# Patient Record
Sex: Female | Born: 1985 | Race: White | Hispanic: No | Marital: Married | Smoking: Former smoker
Health system: Southern US, Community
[De-identification: ages and names within clinical notes are randomized; demographics above are authoritative.]

## PROBLEM LIST (undated history)

## (undated) DIAGNOSIS — C50919 Malignant neoplasm of unspecified site of unspecified female breast: Secondary | ICD-10-CM

## (undated) DIAGNOSIS — Z800 Family history of malignant neoplasm of digestive organs: Secondary | ICD-10-CM

## (undated) DIAGNOSIS — Z8042 Family history of malignant neoplasm of prostate: Secondary | ICD-10-CM

## (undated) DIAGNOSIS — Z87442 Personal history of urinary calculi: Secondary | ICD-10-CM

## (undated) DIAGNOSIS — F32A Depression, unspecified: Secondary | ICD-10-CM

## (undated) DIAGNOSIS — C801 Malignant (primary) neoplasm, unspecified: Secondary | ICD-10-CM

## (undated) DIAGNOSIS — K219 Gastro-esophageal reflux disease without esophagitis: Secondary | ICD-10-CM

## (undated) DIAGNOSIS — F419 Anxiety disorder, unspecified: Secondary | ICD-10-CM

## (undated) HISTORY — DX: Family history of malignant neoplasm of prostate: Z80.42

## (undated) HISTORY — DX: Family history of malignant neoplasm of digestive organs: Z80.0

## (undated) HISTORY — DX: Malignant neoplasm of unspecified site of unspecified female breast: C50.919

## (undated) HISTORY — PX: HERNIA REPAIR: SHX51

## (undated) HISTORY — PX: GASTRIC BYPASS: SHX52

---

## 2009-06-16 ENCOUNTER — Emergency Department: Payer: Self-pay | Admitting: Unknown Physician Specialty

## 2009-06-21 ENCOUNTER — Ambulatory Visit: Payer: Self-pay | Admitting: Internal Medicine

## 2010-06-29 ENCOUNTER — Ambulatory Visit: Payer: Self-pay | Admitting: Neurology

## 2019-02-26 ENCOUNTER — Other Ambulatory Visit: Payer: Self-pay | Admitting: Internal Medicine

## 2019-02-26 DIAGNOSIS — N6310 Unspecified lump in the right breast, unspecified quadrant: Secondary | ICD-10-CM

## 2019-03-03 ENCOUNTER — Other Ambulatory Visit: Payer: Self-pay

## 2019-05-10 HISTORY — PX: BREAST EXCISIONAL BIOPSY: SUR124

## 2019-05-10 HISTORY — PX: BREAST BIOPSY: SHX20

## 2019-05-10 HISTORY — PX: BREAST LUMPECTOMY: SHX2

## 2019-05-19 ENCOUNTER — Ambulatory Visit
Admission: RE | Admit: 2019-05-19 | Discharge: 2019-05-19 | Disposition: A | Discharge status: Home / Self Care | Payer: 59 | Source: Ambulatory Visit | Attending: Internal Medicine | Admitting: Internal Medicine

## 2019-05-19 ENCOUNTER — Other Ambulatory Visit: Payer: Self-pay | Admitting: Internal Medicine

## 2019-05-19 ENCOUNTER — Other Ambulatory Visit: Payer: Self-pay

## 2019-05-19 DIAGNOSIS — N6310 Unspecified lump in the right breast, unspecified quadrant: Secondary | ICD-10-CM

## 2019-05-19 DIAGNOSIS — N6320 Unspecified lump in the left breast, unspecified quadrant: Secondary | ICD-10-CM

## 2019-05-21 ENCOUNTER — Other Ambulatory Visit: Payer: Self-pay

## 2019-05-21 ENCOUNTER — Ambulatory Visit
Admission: RE | Admit: 2019-05-21 | Discharge: 2019-05-21 | Disposition: A | Discharge status: Home / Self Care | Payer: 59 | Source: Ambulatory Visit | Attending: Internal Medicine | Admitting: Internal Medicine

## 2019-05-21 DIAGNOSIS — N6310 Unspecified lump in the right breast, unspecified quadrant: Secondary | ICD-10-CM

## 2019-05-22 ENCOUNTER — Other Ambulatory Visit: Payer: Self-pay | Admitting: Internal Medicine

## 2019-05-22 ENCOUNTER — Telehealth: Payer: Self-pay | Admitting: Hematology and Oncology

## 2019-05-22 DIAGNOSIS — R935 Abnormal findings on diagnostic imaging of other abdominal regions, including retroperitoneum: Secondary | ICD-10-CM

## 2019-05-22 NOTE — Telephone Encounter (Signed)
Spoke with patient to confirm morning [facility] appointment for 11/18, packet emailed to patient

## 2019-05-25 ENCOUNTER — Encounter: Payer: Self-pay | Admitting: *Deleted

## 2019-05-25 ENCOUNTER — Other Ambulatory Visit: Payer: Self-pay | Admitting: *Deleted

## 2019-05-25 DIAGNOSIS — Z170 Estrogen receptor positive status [ER+]: Secondary | ICD-10-CM

## 2019-05-25 DIAGNOSIS — C50311 Malignant neoplasm of lower-inner quadrant of right female breast: Secondary | ICD-10-CM

## 2019-05-26 NOTE — Progress Notes (Signed)
[place] NOTE  Patient Care Team: Jilda Panda, MD as PCP - General (Internal Medicine) Nicholas Lose, MD as Consulting Physician (Hematology and Oncology) Rolm Bookbinder, MD as Consulting Physician (General Surgery) Gery Pray, MD as Consulting Physician (Radiation Oncology) Rockwell Germany, RN as Oncology Nurse Navigator Mauro Kaufmann, RN as Oncology Nurse Navigator  CHIEF COMPLAINTS/PURPOSE OF CONSULTATION:  Newly diagnosed breast cancer  HISTORY OF PRESENTING ILLNESS:  Kimberly Mueller 33 y.o. female is here because of recent diagnosis of invasive ductal carcinoma of the right breast. The patient palpated a right breast lump for two years. Diagnostic mammogram and [place] on 05/19/19 showed a 4.5cm right breast mass at the 6 o'clock position involving the overlying skin, 2 left breast mass at the 10 o'clock position measuring 2.4cm and 2.1cm and benign appearing, and no evidence of bilateral axillary adenopathy. Biopsy on 05/21/19 showed: in the left breast, fibroadenoma and no evidence of malignancy, and in the right breast, invasive ductal carcinoma with DCIS, grade 3, ER 70%, PR 30%, Ki-67 40%, HER-2 equivocal by IHC FISH pending. She presents to the clinic today for initial evaluation and discussion of treatment options.   I reviewed her records extensively and collaborated the history with the patient.  SUMMARY OF ONCOLOGIC HISTORY: Oncology History  Malignant neoplasm of lower-inner quadrant of right breast of female, estrogen receptor positive ([place])  05/25/2019 Initial Diagnosis   Patient palpated a right breast lump for two years. Diagnostic mammogram showed a 4.5cm right breast mass at the 6 o'clock position involving the overlying skin, 2 benign appearing left breast mass at the 10 o'clock position measuring 2.4cm and 2.1cm, and no axillary adenopathy. Biopsy showed: in the left breast, fibroadenoma and no evidence of malignancy, and in the right  breast, IDC with DCIS, grade 3.     MEDICAL HISTORY:  History reviewed. No pertinent past medical history.  SURGICAL HISTORY: Past Surgical History:  Procedure Laterality Date   GASTRIC BYPASS     HERNIA REPAIR      SOCIAL HISTORY: Social History   Socioeconomic History   Marital status: Married    Spouse name: Not on file   Number of children: Not on file   Years of education: Not on file   Highest education level: Not on file  Occupational History   Not on file  Social Needs   Financial resource strain: Not on file   Food insecurity    Worry: Not on file    Inability: Not on file   Transportation needs    Medical: Not on file    Non-medical: Not on file  Tobacco Use   Smoking status: Former Smoker   Smokeless tobacco: Never Used  Substance and Sexual Activity   Alcohol use: Never    Frequency: Never   Drug use: Never   Sexual activity: Not on file  Lifestyle   Physical activity    Days per week: Not on file    Minutes per session: Not on file   Stress: Not on file  Relationships   Social connections    Talks on phone: Not on file    Gets together: Not on file    Attends religious service: Not on file    Active member of club or organization: Not on file    Attends meetings of clubs or organizations: Not on file    Relationship status: Not on file   Intimate partner violence    Fear of current or ex partner:  Not on file    Emotionally abused: Not on file    Physically abused: Not on file    Forced sexual activity: Not on file  Other Topics Concern   Not on file  Social History Narrative   Not on file    FAMILY HISTORY: Family History  Problem Relation Age of Onset   Prostate cancer Father    Pancreatic cancer Paternal Grandmother     ALLERGIES:  has no allergies on file.  MEDICATIONS:  Current Outpatient Medications  Medication Sig Dispense Refill   Ascorbic Acid (VITAMIN C) 100 MG tablet Take 100 mg by mouth daily.      Cyanocobalamin (B-12 COMPLIANCE INJECTION) 1000 MCG/ML KIT Inject as directed.     etonogestrel (NEXPLANON) 68 MG IMPL implant 1 each by Subdermal route once.     Multiple Vitamin (MULTIVITAMIN) tablet Take 1 tablet by mouth daily.     vortioxetine HBr (TRINTELLIX) 5 MG TABS tablet Take 5 mg by mouth daily.     No current facility-administered medications for this visit.     REVIEW OF SYSTEMS:   Constitutional: Denies fevers, chills or abnormal night sweats Eyes: Denies blurriness of vision, double vision or watery eyes Ears, nose, mouth, throat, and face: Denies mucositis or sore throat Respiratory: Denies cough, dyspnea or wheezes Cardiovascular: Denies palpitation, chest discomfort or lower extremity swelling Gastrointestinal:  Denies nausea, heartburn or change in bowel habits Skin: Denies abnormal skin rashes Lymphatics: Denies new lymphadenopathy or easy bruising Neurological:Denies numbness, tingling or new weaknesses Behavioral/Psych: Mood is stable, no new changes  Breast: Palpable bilateral breast masses All other systems were reviewed with the patient and are negative.  PHYSICAL EXAMINATION: ECOG PERFORMANCE STATUS: 1 - Symptomatic but completely ambulatory  Vitals:   05/27/19 0837  BP: 117/85  Pulse: 97  Resp: 20  Temp: 97.8 F (36.6 C)  SpO2: 98%   Filed Weights   05/27/19 0837  Weight: 190 lb 9.6 oz (86.5 kg)    GENERAL:alert, no distress and comfortable SKIN: skin color, texture, turgor are normal, no rashes or significant lesions EYES: normal, conjunctiva are pink and non-injected, sclera clear OROPHARYNX:no exudate, no erythema and lips, buccal mucosa, and tongue normal  NECK: supple, thyroid normal size, non-tender, without nodularity LYMPH:  no palpable lymphadenopathy in the cervical, axillary or inguinal LUNGS: clear to auscultation and percussion with normal breathing effort HEART: regular rate & rhythm and no murmurs and no lower extremity  edema ABDOMEN:abdomen soft, non-tender and normal bowel sounds Musculoskeletal:no cyanosis of digits and no clubbing  PSYCH: alert & oriented x 3 with fluent speech NEURO: no focal motor/sensory deficits BREAST: No palpable nodules in breast. No palpable axillary or supraclavicular lymphadenopathy (exam performed in the presence of a chaperone)   LABORATORY DATA:  I have reviewed the data as listed Lab Results  Component Value Date   WBC 5.3 05/27/2019   HGB 14.5 05/27/2019   HCT 43.7 05/27/2019   MCV 86.5 05/27/2019   PLT 303 05/27/2019   Lab Results  Component Value Date   NA 140 05/27/2019   K 3.7 05/27/2019   CL 105 05/27/2019   CO2 19 (L) 05/27/2019    RADIOGRAPHIC STUDIES: I have personally reviewed the radiological reports and agreed with the findings in the report.  ASSESSMENT AND PLAN:  Malignant neoplasm of lower-inner quadrant of right breast of female, estrogen receptor positive ([place]) 05/15/2019:Patient palpated a right breast lump for two years. Diagnostic mammogram showed a 4.5cm right breast mass at  the 6 o'clock position involving the overlying skin, 2 benign appearing left breast mass at the 10 o'clock position measuring 2.4cm and 2.1cm, and no axillary adenopathy. Biopsy showed: in the left breast, fibroadenoma and no evidence of malignancy, and in the right breast, IDC with DCIS, grade 3.  T2 N0 stage IIa clinical stage  Pathology and radiology counseling: Discussed with the patient, the details of pathology including the type of breast cancer,the clinical staging, the significance of ER, PR and HER-2/neu receptors and the implications for treatment. After reviewing the pathology in detail, we proceeded to discuss the different treatment options between surgery, radiation, chemotherapy, antiestrogen therapies.  Treatment plan: 1.  Neoadjuvant chemotherapy (based upon HER-2 result treatment will change) starting 06/10/2019 2.  Breast conserving surgery with  sentinel lymph node biopsy 3.  Adjuvant radiation 4.  Follow-up adjuvant antiestrogen therapy  Chemotherapy Counseling: I discussed the risks and benefits of chemotherapy including the risks of nausea/ vomiting, risk of infection from low WBC count, fatigue due to chemo or anemia, bruising or bleeding due to low platelets, mouth sores, loss/ change in taste and decreased appetite. Liver and kidney function will be monitored through out chemotherapy as abnormalities in liver and kidney function may be a side effect of treatment. Cardiac dysfunction due to Adriamycin vs Herceptin and Perjeta were discussed in detail. Risk of permanent bone marrow dysfunction and leukemia due to chemo were also discussed.  Fertility preservation: Patient informed me that she does not plan to have children and does not want to do fertility preservation.  Recent gastric bypass surgery revealed a lesion on the liver: We will obtain CT chest abdomen pelvis and bone scan and consider biopsying of this lesion if necessary.  I will see her next week to finalize the chemo plan. Plan to start chemo on 06/10/2019     All questions were answered. The patient knows to call the clinic with any problems, questions or concerns.   Rulon Eisenmenger, MD, MPH 05/27/2019    I, Molly Dorshimer, am acting as scribe for Nicholas Lose, MD.  I have reviewed the above documentation for accuracy and completeness, and I agree with the above.

## 2019-05-27 ENCOUNTER — Encounter: Payer: Self-pay | Admitting: Hematology and Oncology

## 2019-05-27 ENCOUNTER — Ambulatory Visit (HOSPITAL_BASED_OUTPATIENT_CLINIC_OR_DEPARTMENT_OTHER): Payer: 59 | Admitting: Licensed Clinical Social Worker

## 2019-05-27 ENCOUNTER — Inpatient Hospital Stay: Payer: 59

## 2019-05-27 ENCOUNTER — Other Ambulatory Visit: Payer: Self-pay | Admitting: *Deleted

## 2019-05-27 ENCOUNTER — Ambulatory Visit: Payer: 59 | Admitting: Radiation Oncology

## 2019-05-27 ENCOUNTER — Encounter: Payer: Self-pay | Admitting: Physical Therapy

## 2019-05-27 ENCOUNTER — Inpatient Hospital Stay (HOSPITAL_BASED_OUTPATIENT_CLINIC_OR_DEPARTMENT_OTHER): Payer: 59 | Admitting: Hematology and Oncology

## 2019-05-27 ENCOUNTER — Other Ambulatory Visit: Payer: Self-pay | Admitting: General Surgery

## 2019-05-27 ENCOUNTER — Other Ambulatory Visit: Payer: Self-pay

## 2019-05-27 ENCOUNTER — Encounter: Payer: Self-pay | Admitting: Licensed Clinical Social Worker

## 2019-05-27 ENCOUNTER — Ambulatory Visit: Payer: 59 | Attending: General Surgery | Admitting: Physical Therapy

## 2019-05-27 ENCOUNTER — Ambulatory Visit
Admission: RE | Admit: 2019-05-27 | Discharge: 2019-05-27 | Disposition: A | Discharge status: Home / Self Care | Payer: 59 | Source: Ambulatory Visit | Attending: Radiation Oncology | Admitting: Radiation Oncology

## 2019-05-27 DIAGNOSIS — Z170 Estrogen receptor positive status [ER+]: Secondary | ICD-10-CM | POA: Insufficient documentation

## 2019-05-27 DIAGNOSIS — Z8042 Family history of malignant neoplasm of prostate: Secondary | ICD-10-CM | POA: Insufficient documentation

## 2019-05-27 DIAGNOSIS — Z79899 Other long term (current) drug therapy: Secondary | ICD-10-CM | POA: Insufficient documentation

## 2019-05-27 DIAGNOSIS — R293 Abnormal posture: Secondary | ICD-10-CM | POA: Insufficient documentation

## 2019-05-27 DIAGNOSIS — C50311 Malignant neoplasm of lower-inner quadrant of right female breast: Secondary | ICD-10-CM

## 2019-05-27 DIAGNOSIS — Z87891 Personal history of nicotine dependence: Secondary | ICD-10-CM | POA: Insufficient documentation

## 2019-05-27 DIAGNOSIS — Z800 Family history of malignant neoplasm of digestive organs: Secondary | ICD-10-CM | POA: Diagnosis not present

## 2019-05-27 DIAGNOSIS — K769 Liver disease, unspecified: Secondary | ICD-10-CM | POA: Insufficient documentation

## 2019-05-27 LAB — CMP (CANCER CENTER ONLY)
ALT: 27 U/L (ref 0–44)
AST: 22 U/L (ref 15–41)
Albumin: 4.3 g/dL (ref 3.5–5.0)
Alkaline Phosphatase: 69 U/L (ref 38–126)
Anion gap: 16 — ABNORMAL HIGH (ref 5–15)
BUN: 9 mg/dL (ref 6–20)
CO2: 19 mmol/L — ABNORMAL LOW (ref 22–32)
Calcium: 9.4 mg/dL (ref 8.9–10.3)
Chloride: 105 mmol/L (ref 98–111)
Creatinine: 0.77 mg/dL (ref 0.44–1.00)
GFR, Est AFR Am: >60 mL/min (ref >60)
GFR, Estimated: >60 mL/min (ref >60)
Glucose, Bld: 102 mg/dL — ABNORMAL HIGH (ref 70–99)
Potassium: 3.7 mmol/L (ref 3.5–5.1)
Sodium: 140 mmol/L (ref 135–145)
Total Bilirubin: 0.8 mg/dL (ref 0.3–1.2)
Total Protein: 7.4 g/dL (ref 6.5–8.1)

## 2019-05-27 LAB — CBC WITH DIFFERENTIAL (CANCER CENTER ONLY)
Abs Immature Granulocytes: 0 10*3/uL (ref 0.00–0.07)
Basophils Absolute: 0 10*3/uL (ref 0.0–0.1)
Basophils Relative: 1 %
Eosinophils Absolute: 0.1 10*3/uL (ref 0.0–0.5)
Eosinophils Relative: 3 %
HCT: 43.7 % (ref 36.0–46.0)
Hemoglobin: 14.5 g/dL (ref 12.0–15.0)
Immature Granulocytes: 0 %
Lymphocytes Relative: 27 %
Lymphs Abs: 1.4 10*3/uL (ref 0.7–4.0)
MCH: 28.7 pg (ref 26.0–34.0)
MCHC: 33.2 g/dL (ref 30.0–36.0)
MCV: 86.5 fL (ref 80.0–100.0)
Monocytes Absolute: 0.7 10*3/uL (ref 0.1–1.0)
Monocytes Relative: 14 %
Neutro Abs: 3 10*3/uL (ref 1.7–7.7)
Neutrophils Relative %: 55 %
Platelet Count: 303 10*3/uL (ref 150–400)
RBC: 5.05 MIL/uL (ref 3.87–5.11)
RDW: 14.6 % (ref 11.5–15.5)
WBC Count: 5.3 10*3/uL (ref 4.0–10.5)
nRBC: 0 % (ref 0.0–0.2)

## 2019-05-27 NOTE — Assessment & Plan Note (Signed)
05/15/2019:Patient palpated a right breast lump for two years. Diagnostic mammogram showed a 4.5cm right breast mass at the 6 o'clock position involving the overlying skin, 2 benign appearing left breast mass at the 10 o'clock position measuring 2.4cm and 2.1cm, and no axillary adenopathy. Biopsy showed: in the left breast, fibroadenoma and no evidence of malignancy, and in the right breast, IDC with DCIS, grade 3.  T2 N0 stage IIa clinical stage  Pathology and radiology counseling: Discussed with the patient, the details of pathology including the type of breast cancer,the clinical staging, the significance of ER, PR and HER-2/neu receptors and the implications for treatment. After reviewing the pathology in detail, we proceeded to discuss the different treatment options between surgery, radiation, chemotherapy, antiestrogen therapies.  Treatment plan: 1.  Neoadjuvant chemotherapy (based upon HER-2 result treatment will change) starting 06/10/2019 2.  Breast conserving surgery with sentinel lymph node biopsy 3.  Adjuvant radiation 4.  Follow-up adjuvant antiestrogen therapy  Chemotherapy Counseling: I discussed the risks and benefits of chemotherapy including the risks of nausea/ vomiting, risk of infection from low WBC count, fatigue due to chemo or anemia, bruising or bleeding due to low platelets, mouth sores, loss/ change in taste and decreased appetite. Liver and kidney function will be monitored through out chemotherapy as abnormalities in liver and kidney function may be a side effect of treatment. Cardiac dysfunction due to Adriamycin vs Herceptin and Perjeta were discussed in detail. Risk of permanent bone marrow dysfunction and leukemia due to chemo were also discussed.  Fertility preservation: Patient informed me that she does not plan to have children and does not want to do fertility preservation.  Recent gastric bypass surgery revealed a lesion on the liver: We will obtain CT chest  abdomen pelvis and bone scan and consider biopsying of this lesion if necessary.  I will see her next week to finalize the chemo plan. Plan to start chemo on 06/10/2019

## 2019-05-27 NOTE — Progress Notes (Signed)
REFERRING PROVIDER: Nicholas Lose, MD [address] [place],  [place] [address]  PRIMARY PROVIDER:  Jilda Panda, MD  PRIMARY REASON FOR VISIT:  1. Malignant neoplasm of lower-inner quadrant of right breast of female, estrogen receptor positive ([place])   2. Family history of prostate cancer   3. Family history of pancreatic cancer     I connected with Kimberly Mueller on 05/27/2019 at 11:20 AM EDT by Jackquline Denmark and verified that I am speaking with the correct person using two identifiers.    Patient location: [facility] Provider location: office  HISTORY OF PRESENT ILLNESS:   Kimberly Mueller, a 33 y.o. female, was seen for a [place] cancer genetics consultation at the request of Dr. Lindi Adie due to a personal and family history of cancer.  Kimberly Mueller presents to clinic today to discuss the possibility of a hereditary predisposition to cancer, genetic testing, and to further clarify her future cancer risks, as well as potential cancer risks for family members.   In 2020, at the age of 66, Kimberly Mueller was diagnosed with invasive ductal carcinoma of the right breast. The treatment plan includes neoadjuvant chemotherapy, surgery and radiation.    CANCER HISTORY:  Oncology History  Malignant neoplasm of lower-inner quadrant of right breast of female, estrogen receptor positive ([place])  05/25/2019 Initial Diagnosis   Patient palpated a right breast lump for two years. Diagnostic mammogram showed a 4.5cm right breast mass at the 6 o'clock position involving the overlying skin, 2 benign appearing left breast mass at the 10 o'clock position measuring 2.4cm and 2.1cm, and no axillary adenopathy. Biopsy showed: in the left breast, fibroadenoma and no evidence of malignancy, and in the right breast, IDC with DCIS, grade 3.  ER 70%, PR 30%, Ki-67 40%, HER-2 IHC equivocal FISH pending      RISK FACTORS:  Menarche was at age 53.  First live birth at age no children.  OCP use for approximately 6 years.   Ovaries intact: yes.  Hysterectomy: no.  Menopausal status: premenopausal.  HRT use: 0 years. Colonoscopy: no; not examined. Mammogram within the last year: yes. Number of breast biopsies: 1. Up to date with pelvic exams: yes. Any excessive radiation exposure in the past: no  Past Medical History:  Diagnosis Date  . Family history of pancreatic cancer   . Family history of prostate cancer     Past Surgical History:  Procedure Laterality Date  . GASTRIC BYPASS    . HERNIA REPAIR      Social History   Socioeconomic History  . Marital status: Married    Spouse name: Not on file  . Number of children: Not on file  . Years of education: Not on file  . Highest education level: Not on file  Occupational History  . Not on file  Social Needs  . Financial resource strain: Not on file  . Food insecurity    Worry: Not on file    Inability: Not on file  . Transportation needs    Medical: Not on file    Non-medical: Not on file  Tobacco Use  . Smoking status: Former Research scientist (life sciences)  . Smokeless tobacco: Never Used  Substance and Sexual Activity  . Alcohol use: Never    Frequency: Never  . Drug use: Never  . Sexual activity: Not on file  Lifestyle  . Physical activity    Days per week: Not on file    Minutes per session: Not on file  . Stress: Not on file  Relationships  . Social Herbalist on phone: Not on file    Gets together: Not on file    Attends religious service: Not on file    Active member of club or organization: Not on file    Attends meetings of clubs or organizations: Not on file    Relationship status: Not on file  Other Topics Concern  . Not on file  Social History Narrative  . Not on file     FAMILY HISTORY:  We obtained a detailed, 4-generation family history.  Significant diagnoses are listed below: Family History  Problem Relation Age of Onset  . Prostate cancer Father   . Pancreatic cancer Paternal Grandmother    Kimberly Mueller does not  have children. She has 1 brother, 18, and 1 sister, 74, and 5 nieces/nephews.  Kimberly Mueller mother is living at 39 with no history of cancer. Patient has 1 maternal uncle, no cancers. No known cancers in maternal cousins. Maternal grandmother had skin cancer on her chest, she is living in her 37s. Maternal grandfather passed in his 43s. His father, patient's great grandfather, had pancreatic cancer.   Kimberly Mueller father is living at 34 and had prostate cancer at 76. Patient has 1 paternal aunt, 1 paternal uncle, no cancers. No known cancers in paternal cousins. Paternal grandmother is living in her 85s, paternal grandfather passed due to heart issues, no history of cancer.  Kimberly Mueller is unaware of previous family history of genetic testing for hereditary cancer risks. Patient's maternal ancestors are of European descent, and paternal ancestors are of European descent. There is no reported Ashkenazi Jewish ancestry. There is no known consanguinity.  GENETIC COUNSELING ASSESSMENT: Ms. Pals is a 33 y.o. female with a personal history which is somewhat suggestive of a hereditary cancer syndrome and predisposition to cancer. We, therefore, discussed and recommended the following at today's visit.   DISCUSSION: We discussed that 5 - 10% of breast cancer is hereditary, with most cases associated with BRCA1/BRCA2 mutations.  There are other genes that can be associated with hereditary breast cancer syndromes, and other genes associated with some of the cancers seen in her family such as prostate and pancreatic. We discussed that testing is beneficial for several reasons including surgical decision-making for breast cancer, knowing how to follow individuals after completing their treatment, and understand if other family members could be at risk for cancer and allow them to undergo genetic testing.   We reviewed the characteristics, features and inheritance patterns of hereditary cancer syndromes. We also  discussed genetic testing, including the appropriate family members to test, the process of testing, insurance coverage and turn-around-time for results. We discussed the implications of a negative, positive and/or variant of uncertain significant result. We recommended Kimberly Mueller pursue genetic testing for the [facility] Common Hereditary Cancers gene panel.   The Common Hereditary Cancers Panel offered by [facility] includes sequencing and/or deletion duplication testing of the following 48 genes: APC, ATM, AXIN2, BARD1, BMPR1A, BRCA1, BRCA2, BRIP1, CDH1, CDKN2A (p14ARF), CDKN2A (p16INK4a), CKD4, CHEK2, CTNNA1, DICER1, EPCAM (Deletion/duplication testing only), GREM1 (promoter region deletion/duplication testing only), KIT, MEN1, MLH1, MSH2, MSH3, MSH6, MUTYH, NBN, NF1, NHTL1, PALB2, PDGFRA, PMS2, POLD1, POLE, PTEN, RAD50, RAD51C, RAD51D, RNF43, SDHB, SDHC, SDHD, SMAD4, SMARCA4. STK11, TP53, TSC1, TSC2, and VHL.  The following genes were evaluated for sequence changes only: SDHA and HOXB13 c.251G>A variant only.  Based on Kimberly Mueller personal and family history of cancer, she meets medical criteria for genetic  testing. Despite that she meets criteria, she may still have an out of pocket cost.   PLAN: After considering the risks, benefits, and limitations, Kimberly Mueller provided informed consent to pursue genetic testing and the blood sample was sent to [facility] for analysis of the Common Hereditary Cancers Panel. Results should be available within approximately 2-3 weeks' time, at which point they will be disclosed by telephone to Kimberly Mueller, as will any additional recommendations warranted by these results. Kimberly Mueller will receive a summary of her genetic counseling visit and a copy of her results once available. This information will also be available in Epic.   Kimberly Mueller questions were answered to her satisfaction today. Our contact information was provided should additional questions or concerns  arise. Thank you for the referral and allowing [place] to share in the care of your patient.   Faith Rogue, MS, [facility] Genetic Counselor [email or web address] Phone: [phone]  The patient was seen for a total of 20 minutes in virtual genetic counseling.  Drs. Magrinat, Lindi Adie and/or Burr Medico were available for discussion regarding this case.   _______________________________________________________________________ For Office Staff:  Number of people involved in session: 1 Was an Intern/ student involved with case: no

## 2019-05-27 NOTE — Patient Instructions (Signed)

## 2019-05-27 NOTE — Therapy (Signed)
[place], [place], [address] Phone: [phone]   Fax:  [phone]  Physical Therapy Evaluation  Patient Details  Name: [place] MRN: [id number] Date of Birth: January 11, 1986 Referring Provider (PT): Dr. Rolm Bookbinder   Encounter Date: 05/27/2019  PT End of Session - 05/27/19 1133    Visit Number  1    Number of Visits  1    PT Start Time  1030    PT Stop Time  1055    PT Time Calculation (min)  25 min    Activity Tolerance  Patient tolerated treatment well    Behavior During Therapy  [facility] for tasks assessed/performed       History reviewed. No pertinent past medical history.  Past Surgical History:  Procedure Laterality Date  . GASTRIC BYPASS    . HERNIA REPAIR      There were no vitals filed for this visit.   Subjective Assessment - 05/27/19 1119    Subjective  Patient reports she is here today to be seen by her medical team for her newly diagnosed right breast cancer.    Patient is accompained by:  Family member    Pertinent History  Patient was diagnosed on 05/19/2019 with right invasive ductal carcinoma breast cancer. It measures 4.5 cm and is located in the lower inner quadrant. It is ER/PR positive and HER2 equivocal with a Ki67 of 40%. She had gastric bypass and a hernia repair in [place] on 04/14/2019.    Patient Stated Goals  Reduce lymphedema risk and learn post op shoulder ROM HEP    Currently in Pain?  No/denies         [facility] PT Assessment - 05/27/19 0001      Assessment   Medical Diagnosis  Right breast cancer    Referring Provider (PT)  Dr. Rolm Bookbinder    Onset Date/Surgical Date  05/19/19    Hand Dominance  Right    Prior Therapy  none      Precautions   Precautions  Other (comment)    Precaution Comments  active cancer      Restrictions   Weight Bearing Restrictions  No      Balance Screen   Has the patient fallen in the past 6 months  No    Has  the patient had a decrease in activity level because of a fear of falling?   No    Is the patient reluctant to leave their home because of a fear of falling?   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    Leisure  She walks 2-4x/week for 30-60 min      Cognition   Overall Cognitive Status  Within Functional Limits for tasks assessed      Posture/Postural Control   Posture/Postural Control  Postural limitations    Postural Limitations  Rounded Shoulders;Forward head      ROM / Strength   AROM / PROM / Strength  AROM;Strength      AROM   Overall AROM Comments  Cervical AROM is WNL    AROM Assessment Site  Shoulder    Right/Left Shoulder  Right;Left    Right Shoulder Extension  48 Degrees    Right Shoulder Flexion  150 Degrees    Right Shoulder ABduction  174 Degrees    Right Shoulder Internal Rotation  76 Degrees    Right Shoulder External Rotation  90 Degrees    Left Shoulder Extension  47 Degrees    Left Shoulder Flexion  157 Degrees    Left Shoulder ABduction  178 Degrees    Left Shoulder Internal Rotation  62 Degrees    Left Shoulder External Rotation  90 Degrees      Strength   Overall Strength  Within functional limits for tasks performed        LYMPHEDEMA/ONCOLOGY QUESTIONNAIRE - 05/27/19 1127      Type   Cancer Type  Right breast cancer      Lymphedema Assessments   Lymphedema Assessments  Upper extremities      Right Upper Extremity Lymphedema   10 cm Proximal to Olecranon Process  35.6 cm    Olecranon Process  26.5 cm    10 cm Proximal to Ulnar Styloid Process  24.4 cm    Just Proximal to Ulnar Styloid Process  16.5 cm    Across Hand at [employer]  17.4 cm    At [place]  of 2nd Digit  5.9 cm      Left Upper Extremity Lymphedema   10 cm Proximal to Olecranon Process  35.7 cm    Olecranon Process  26.8 cm    10 cm Proximal to Ulnar Styloid Process  23.6 cm    Just Proximal to Ulnar Styloid Process  16.8 cm    Across Hand at [employer]  18.5 cm    At [place] of 2nd Digit  5.6 cm          Quick Dash - 05/27/19 0001    Open a tight or new jar  No difficulty    Do heavy household chores (wash walls, wash floors)  No difficulty    Carry a shopping bag or briefcase  No difficulty    Wash your back  No difficulty    Use a knife to cut food  No difficulty    Recreational activities in which you take some force or impact through your arm, shoulder, or hand (golf, hammering, tennis)  No difficulty    During the past week, to what extent has your arm, shoulder or hand problem interfered with your normal social activities with family, friends, neighbors, or groups?  Not at all    During the past week, to what extent has your arm, shoulder or hand problem limited your work or other regular daily activities  Not at all    Arm, shoulder, or hand pain.  None    Tingling (pins and needles) in your arm, shoulder, or hand  None    Difficulty Sleeping  No difficulty    DASH Score  0 %        Objective measurements completed on examination: See above findings.       Patient was instructed today in a home exercise program today for post op shoulder range of motion. These included active assist shoulder flexion in sitting, scapular retraction, wall walking with shoulder abduction, and hands behind head external rotation.  She was encouraged to do these twice a day, holding 3 seconds and repeating 5 times when permitted by her physician.  PT Education - 05/27/19 1132    Education Details  Lymphedema risk reduction and post op shoulder ROM HEP    Person(s) Educated  Patient;Spouse    Methods  Explanation;Demonstration;Handout    Comprehension  Returned  demonstration;Verbalized understanding           Breast Clinic Goals - 05/27/19 1138      Patient will be able to verbalize understanding of pertinent lymphedema risk reduction practices relevant to her diagnosis specifically related to skin care.   Time  1    Period  Days    Status  Achieved      Patient will be able to return demonstrate and/or verbalize understanding of the post-op home exercise program related to regaining shoulder range of motion.   Time  1    Period  Days    Status  Achieved      Patient will be able to verbalize understanding of the importance of attending the postoperative After Breast Cancer Class for further lymphedema risk reduction education and therapeutic exercise.   Time  1    Period  Days    Status  Achieved            Plan - 05/27/19 1133    Clinical Impression Statement  Patient was diagnosed on 05/19/2019 with right invasive ductal carcinoma breast cancer. It measures 4.5 cm and is located in the lower inner quadrant. It is ER/PR positive and HER2 equivocal with a Ki67 of 40%. She had gastric bypass and a hernia repair in [place] on 04/14/2019. Her multidisciplinary medical team met prior to her assessments to determine a recommended treatment plan. She is planning to have neoadjuvant chemotherapy followed by a right lumpectomy or mastectomy - either with sentinel node biopsy followed by radiation if she has a lumpectomy and then followed by anti-estrogen therapy. She will benefit from a post op PT reassessment if MD refers.    Personal Factors and Comorbidities  Comorbidity 1    Comorbidities  Recent surgery 1 month ago to repair hernia and gastric bypass    Stability/Clinical Decision Making  Stable/Uncomplicated    Clinical Decision Making  Low    Rehab Potential  Excellent    PT Frequency  One time visit    PT Treatment/Interventions  ADLs/Self Care Home Management;Therapeutic exercise;Patient/family education    PT Next Visit Plan  Will  reassess if MD refers after surgery    PT Home Exercise Plan  Post op shoulder ROM HEP    Consulted and Agree with Plan of Care  Patient;Family member/caregiver    Family Member Consulted  Husband       Patient will benefit from skilled therapeutic intervention in order to improve the following deficits and impairments:  Postural dysfunction, Decreased range of motion, Pain, Impaired UE functional use, Decreased knowledge of precautions  Visit Diagnosis: Malignant neoplasm of lower-inner quadrant of right breast of female, estrogen receptor positive ([place]) - Plan: PT plan of care cert/re-cert  Abnormal posture - Plan: PT plan of care cert/re-cert   Patient will follow up at outpatient cancer rehab 3-4 weeks following surgery.  If the patient requires physical therapy at that time, a specific plan will be dictated and sent to the referring physician for approval. The patient was educated today on appropriate basic range of motion exercises to begin post operatively and the importance of attending the After Breast Cancer class following surgery.  Patient was educated today on lymphedema risk reduction practices as it pertains to recommendations  that will benefit the patient immediately following surgery.  She verbalized good understanding.      Problem List Patient Active Problem List   Diagnosis Date Noted  . Malignant neoplasm of lower-inner quadrant of right breast of female, estrogen receptor positive ([place]) 05/25/2019   Annia Friendly, PT 05/27/19 11:40 AM  [place], [place], [address] Phone: [phone]   Fax:  [phone]  Name: Channel Papandrea MRN: [id number] Date of Birth: February 19, 1986

## 2019-05-27 NOTE — Progress Notes (Signed)
Radiation Oncology         [phone] ________________________________  Multidisciplinary Breast Oncology Clinic [facility]) Initial Outpatient Consultation  Name: Kimberly Mueller MRN: [id number]  Date: 05/27/2019  DOB: [birth year]  JG:[id number], Carloyn Manner, MD  Rolm Bookbinder, MD   REFERRING PHYSICIAN: Rolm Bookbinder, MD  DIAGNOSIS: The encounter diagnosis was Malignant neoplasm of lower-inner quadrant of right breast of female, estrogen receptor positive ([place]).  Stage IIA, Right Breast LIQ, Invasive Ductal with DCIS and LVI, ER+/ PR+ / Her2+, Grade 3    ICD-10-CM   1. Malignant neoplasm of lower-inner quadrant of right breast of female, estrogen receptor positive ([place])  C50.311    Z17.0     HISTORY OF PRESENT ILLNESS::Kimberly Mueller is a 33 y.o. female who is presenting to the office today for evaluation of her newly diagnosed breast cancer. She is accompanied by her husband. She is doing well overall.   She had a palpable lump on the right breast for two years. Lump was also palpated in the left breast on clinical exam. She underwent bilateral diagnostic mammography with tomography and bilateral breast ultrasonography at [facility] on 05/19/2019 showing a suspicious 4.5 cm mass at 6 o'clock at the palpable site in the lower-inner right breast which appears to be invading the overlying skin. It also showed two solid masses in the left breast at 10 o'clock. No evidence of bilateral axillary lymphadenopathy.  Biopsy on 05/21/2019 showed: invasive ductal carcinoma and lymphovascular invasion in right breast; fibroadenoma without malignancy in left breast. Prognostic indicators significant for: estrogen receptor, 70% positive and progesterone receptor, 30% positive, both with strong staining intensity. Proliferation marker Ki67 at 40%. HER2 positive.  Menarche: 33 years old Age at first live birth: N/A GP: 0 LMP: 05/19/2019 Contraceptive: Yes, 10+ years ago HRT: No   The patient  was referred today for presentation in the multidisciplinary conference.  Radiology studies and pathology slides were presented there for review and discussion of treatment options.  A consensus was discussed regarding potential next steps.  PREVIOUS RADIATION THERAPY: No  PAST MEDICAL HISTORY:  Past Medical History:  Diagnosis Date   Family history of pancreatic cancer    Family history of prostate cancer     PAST SURGICAL HISTORY: Past Surgical History:  Procedure Laterality Date   GASTRIC BYPASS     HERNIA REPAIR      FAMILY HISTORY:  Family History  Problem Relation Age of Onset   Prostate cancer Father    Pancreatic cancer Paternal Grandmother     SOCIAL HISTORY:  Social History   Socioeconomic History   Marital status: Married    Spouse name: Not on file   Number of children: Not on file   Years of education: Not on file   Highest education level: Not on file  Occupational History   Not on file  Social Needs   Financial resource strain: Not on file   Food insecurity    Worry: Not on file    Inability: Not on file   Transportation needs    Medical: Not on file    Non-medical: Not on file  Tobacco Use   Smoking status: Former Smoker   Smokeless tobacco: Never Used  Substance and Sexual Activity   Alcohol use: Never    Frequency: Never   Drug use: Never   Sexual activity: Not on file  Lifestyle   Physical activity    Days per week: Not on file    Minutes per session: Not on  file   Stress: Not on file  Relationships   Social connections    Talks on phone: Not on file    Gets together: Not on file    Attends religious service: Not on file    Active member of club or organization: Not on file    Attends meetings of clubs or organizations: Not on file    Relationship status: Not on file  Other Topics Concern   Not on file  Social History Narrative   Not on file    ALLERGIES: Not on File  MEDICATIONS:  Current Outpatient  Medications  Medication Sig Dispense Refill   Ascorbic Acid (VITAMIN C) 100 MG tablet Take 100 mg by mouth daily.     Cyanocobalamin (B-12 COMPLIANCE INJECTION) 1000 MCG/ML KIT Inject as directed.     etonogestrel (NEXPLANON) 68 MG IMPL implant 1 each by Subdermal route once.     Multiple Vitamin (MULTIVITAMIN) tablet Take 1 tablet by mouth daily.     vortioxetine HBr (TRINTELLIX) 5 MG TABS tablet Take 5 mg by mouth daily.     No current facility-administered medications for this encounter.     REVIEW OF SYSTEMS: A 10+ POINT REVIEW OF SYSTEMS WAS OBTAINED including neurology, dermatology, psychiatry, cardiac, respiratory, lymph, extremities, GI, GU, musculoskeletal, constitutional, reproductive, HEENT. On the provided form, she reports dental problems, pain/lump in right breast, and right breast dimpling. Wears glasses. She denies chest pain, cough, shortness of breath, skin changes, and any other symptoms.    PHYSICAL EXAM:  Vitals with BMI 05/27/2019  Height '5\' 3"'$   Weight 190 lbs 10 oz  BMI 94.70  Systolic 962  Diastolic 85  Pulse 97   Lungs are clear to auscultation bilaterally. Heart has regular rate and rhythm. No palpable cervical, supraclavicular, or axillary adenopathy. Abdomen soft, non-tender, normal bowel sounds. Breast: Left breast with bruising in upper inner aspect with palpable nodules (benign on biopsy). Right breast has palpable mass at the 6 o'clock position that is 3.5 x 4.0 cm. Mass is mobile. No skin involvement or chest wall fixation.  KPS = 90  100 - Normal; no complaints; no evidence of disease. 90   - Able to carry on normal activity; minor signs or symptoms of disease. 80   - Normal activity with effort; some signs or symptoms of disease. 29   - Cares for self; unable to carry on normal activity or to do active work. 60   - Requires occasional assistance, but is able to care for most of his personal needs. 50   - Requires considerable assistance and  frequent medical care. 15   - Disabled; requires special care and assistance. 20   - Severely disabled; hospital admission is indicated although death not imminent. 54   - Very sick; hospital admission necessary; active supportive treatment necessary. 10   - Moribund; fatal processes progressing rapidly. 0     - Dead  Karnofsky DA, Abelmann Badger Lee, Craver LS and Burchenal [facility] [phone]) The use of the nitrogen mustards in the palliative treatment of carcinoma: with particular reference to bronchogenic carcinoma Cancer 1 [id number]  LABORATORY DATA:  Lab Results  Component Value Date   WBC 5.3 05/27/2019   HGB 14.5 05/27/2019   HCT 43.7 05/27/2019   MCV 86.5 05/27/2019   PLT 303 05/27/2019   Lab Results  Component Value Date   NA 140 05/27/2019   K 3.7 05/27/2019   CL 105 05/27/2019   CO2 19 (L) 05/27/2019   Lab  Results  Component Value Date   ALT 27 05/27/2019   AST 22 05/27/2019   ALKPHOS 69 05/27/2019   BILITOT 0.8 05/27/2019    PULMONARY FUNCTION TEST:   Recent Review Flowsheet Data    There is no flowsheet data to display.      RADIOGRAPHY: US Breast Ltd Uni Left Inc Axilla  Result Date: 05/19/2019 CLINICAL DATA:  33 year old female presenting for evaluation of a palpable lump in the right breast which she has felt for about 2 years. A lump was also palpated in the left breast on clinical breast exam. The patient feels that these may be new. EXAM: DIGITAL DIAGNOSTIC BILATERAL MAMMOGRAM WITH CAD AND TOMO ULTRASOUND BILATERAL BREAST COMPARISON:  None. ACR Breast Density Category d: The breast tissue is extremely dense, which lowers the sensitivity of mammography. FINDINGS: A BB indicating the palpable site of concern identified by the patient has been placed on the lower slightly inner quadrant of the right breast. There is a dense superficial irregular mass at this location which mammographically measures approximately 2.7 cm. A BB indicating the palpable site identified on clinical  breast exam has been placed on the upper inner left breast. In the vicinity of this marker, there are 2 adjacent oval circumscribed masses in the posterior depth. No other suspicious calcifications, masses or areas of distortion are seen in the bilateral breasts. Mammographic images were processed with CAD. Ultrasound targeted to the right breast at 6 o'clock, 4 cm from the nipple demonstrates an irregular hypoechoic mass measuring approximately 4.5 x 2.3 x 3.4 cm. The mass is highly vascular on color Doppler imaging and is invading into the skin surface. Ultrasound of the left breast at 10 o'clock, 7 cm from the nipple demonstrates an oval hypoechoic circumscribed mass measuring 2.1 x 1.5 x 2.4 cm. There is an adjacent similar-appearing mass at 10 o'clock, 8 cm from the nipple measuring 2.1 x 1.1 x 1.6 cm. Ultrasound of the bilateral axilla demonstrates no normal-appearing lymph nodes. IMPRESSION: 1. There is a suspicious 4.5 cm mass at the palpable site in the lower-inner right breast which appears to be invading the overlying skin. 2. There are 2 solid masses in the left breast at 10 o'clock. While these have a likely benign appearance, ultrasound-guided biopsy is recommended as the patient states these may be new. 3.  No evidence of bilateral axillary lymphadenopathy. RECOMMENDATION: Ultrasound guided biopsy is recommended for the palpable lump in the right breast at 6 o'clock and the 2 palpable adjacent masses in the left breast at 10 o'clock. These procedures have been scheduled for 05/21/2019 at 10:30 a.m. I did discuss with the patient that given the appearance of the palpable mass in the right breast, that surgical excision would be recommended regardless of pathology. I have discussed the findings and recommendations with the patient. If applicable, a reminder letter will be sent to the patient regarding the next appointment. BI-RADS CATEGORY  5: Highly suggestive of malignancy. Electronically Signed   By:  Ammie Ferrier M.D.   On: 05/19/2019 09:20   US Breast Ltd Uni Right Inc Axilla  Result Date: 05/19/2019 CLINICAL DATA:  33 year old female presenting for evaluation of a palpable lump in the right breast which she has felt for about 2 years. A lump was also palpated in the left breast on clinical breast exam. The patient feels that these may be new. EXAM: DIGITAL DIAGNOSTIC BILATERAL MAMMOGRAM WITH CAD AND TOMO ULTRASOUND BILATERAL BREAST COMPARISON:  None. ACR Breast Density Category  d: The breast tissue is extremely dense, which lowers the sensitivity of mammography. FINDINGS: A BB indicating the palpable site of concern identified by the patient has been placed on the lower slightly inner quadrant of the right breast. There is a dense superficial irregular mass at this location which mammographically measures approximately 2.7 cm. A BB indicating the palpable site identified on clinical breast exam has been placed on the upper inner left breast. In the vicinity of this marker, there are 2 adjacent oval circumscribed masses in the posterior depth. No other suspicious calcifications, masses or areas of distortion are seen in the bilateral breasts. Mammographic images were processed with CAD. Ultrasound targeted to the right breast at 6 o'clock, 4 cm from the nipple demonstrates an irregular hypoechoic mass measuring approximately 4.5 x 2.3 x 3.4 cm. The mass is highly vascular on color Doppler imaging and is invading into the skin surface. Ultrasound of the left breast at 10 o'clock, 7 cm from the nipple demonstrates an oval hypoechoic circumscribed mass measuring 2.1 x 1.5 x 2.4 cm. There is an adjacent similar-appearing mass at 10 o'clock, 8 cm from the nipple measuring 2.1 x 1.1 x 1.6 cm. Ultrasound of the bilateral axilla demonstrates no normal-appearing lymph nodes. IMPRESSION: 1. There is a suspicious 4.5 cm mass at the palpable site in the lower-inner right breast which appears to be invading the  overlying skin. 2. There are 2 solid masses in the left breast at 10 o'clock. While these have a likely benign appearance, ultrasound-guided biopsy is recommended as the patient states these may be new. 3.  No evidence of bilateral axillary lymphadenopathy. RECOMMENDATION: Ultrasound guided biopsy is recommended for the palpable lump in the right breast at 6 o'clock and the 2 palpable adjacent masses in the left breast at 10 o'clock. These procedures have been scheduled for 05/21/2019 at 10:30 a.m. I did discuss with the patient that given the appearance of the palpable mass in the right breast, that surgical excision would be recommended regardless of pathology. I have discussed the findings and recommendations with the patient. If applicable, a reminder letter will be sent to the patient regarding the next appointment. BI-RADS CATEGORY  5: Highly suggestive of malignancy. Electronically Signed   By: Ammie Ferrier M.D.   On: 05/19/2019 09:20   Mm Diag Breast Tomo Bilateral  Result Date: 05/19/2019 CLINICAL DATA:  33 year old female presenting for evaluation of a palpable lump in the right breast which she has felt for about 2 years. A lump was also palpated in the left breast on clinical breast exam. The patient feels that these may be new. EXAM: DIGITAL DIAGNOSTIC BILATERAL MAMMOGRAM WITH CAD AND TOMO ULTRASOUND BILATERAL BREAST COMPARISON:  None. ACR Breast Density Category d: The breast tissue is extremely dense, which lowers the sensitivity of mammography. FINDINGS: A BB indicating the palpable site of concern identified by the patient has been placed on the lower slightly inner quadrant of the right breast. There is a dense superficial irregular mass at this location which mammographically measures approximately 2.7 cm. A BB indicating the palpable site identified on clinical breast exam has been placed on the upper inner left breast. In the vicinity of this marker, there are 2 adjacent oval  circumscribed masses in the posterior depth. No other suspicious calcifications, masses or areas of distortion are seen in the bilateral breasts. Mammographic images were processed with CAD. Ultrasound targeted to the right breast at 6 o'clock, 4 cm from the nipple demonstrates an irregular  hypoechoic mass measuring approximately 4.5 x 2.3 x 3.4 cm. The mass is highly vascular on color Doppler imaging and is invading into the skin surface. Ultrasound of the left breast at 10 o'clock, 7 cm from the nipple demonstrates an oval hypoechoic circumscribed mass measuring 2.1 x 1.5 x 2.4 cm. There is an adjacent similar-appearing mass at 10 o'clock, 8 cm from the nipple measuring 2.1 x 1.1 x 1.6 cm. Ultrasound of the bilateral axilla demonstrates no normal-appearing lymph nodes. IMPRESSION: 1. There is a suspicious 4.5 cm mass at the palpable site in the lower-inner right breast which appears to be invading the overlying skin. 2. There are 2 solid masses in the left breast at 10 o'clock. While these have a likely benign appearance, ultrasound-guided biopsy is recommended as the patient states these may be new. 3.  No evidence of bilateral axillary lymphadenopathy. RECOMMENDATION: Ultrasound guided biopsy is recommended for the palpable lump in the right breast at 6 o'clock and the 2 palpable adjacent masses in the left breast at 10 o'clock. These procedures have been scheduled for 05/21/2019 at 10:30 a.m. I did discuss with the patient that given the appearance of the palpable mass in the right breast, that surgical excision would be recommended regardless of pathology. I have discussed the findings and recommendations with the patient. If applicable, a reminder letter will be sent to the patient regarding the next appointment. BI-RADS CATEGORY  5: Highly suggestive of malignancy. Electronically Signed   By: Ammie Ferrier M.D.   On: 05/19/2019 09:20   Mm Clip Placement Left  Result Date: 05/21/2019 CLINICAL DATA:   Patient presents for biopsy of a right breast mass as well as biopsy of two left breast masses. EXAM: DIAGNOSTIC BILATERAL MAMMOGRAM POST ULTRASOUND BIOPSY COMPARISON:  Previous exam(s). FINDINGS: 1. Mammographic images were obtained following ultrasound guided biopsy of a right breast mass at 6 o'clock. The ribbon biopsy marking clip is in expected position at the site of biopsy. 2. Mammographic images were obtained following ultrasound guided biopsy of a left breast mass at 10 o'clock 8 cm from the nipple. The ribbon biopsy marking clip is in expected position at the site of biopsy. 3. Mammographic images were obtained following ultrasound guided biopsy of a left breast mass at 10 o'clock 7 cm from the nipple. The coil biopsy marking clip is in expected position at the site of biopsy. IMPRESSION: 1. Appropriate positioning of the ribbon shaped biopsy marking clip at the site of biopsy in the right breast at 6 o'clock. 2. Appropriate positioning of the ribbon shaped biopsy marking clip at the site of biopsy in the left breast at 10 o'clock 8 cm from the nipple. 3. Appropriate positioning of the coil shaped biopsy marking clip at the site of biopsy in the left breast at 10 o'clock 7 cm from the nipple. Final Assessment: Post Procedure Mammograms for Marker Placement Electronically Signed   By: Audie Pinto M.D.   On: 05/21/2019 11:33   Mm Clip Placement Right  Result Date: 05/21/2019 CLINICAL DATA:  Patient presents for biopsy of a right breast mass as well as biopsy of two left breast masses. EXAM: DIAGNOSTIC BILATERAL MAMMOGRAM POST ULTRASOUND BIOPSY COMPARISON:  Previous exam(s). FINDINGS: 1. Mammographic images were obtained following ultrasound guided biopsy of a right breast mass at 6 o'clock. The ribbon biopsy marking clip is in expected position at the site of biopsy. 2. Mammographic images were obtained following ultrasound guided biopsy of a left breast mass at 10 o'clock  8 cm from the nipple.  The ribbon biopsy marking clip is in expected position at the site of biopsy. 3. Mammographic images were obtained following ultrasound guided biopsy of a left breast mass at 10 o'clock 7 cm from the nipple. The coil biopsy marking clip is in expected position at the site of biopsy. IMPRESSION: 1. Appropriate positioning of the ribbon shaped biopsy marking clip at the site of biopsy in the right breast at 6 o'clock. 2. Appropriate positioning of the ribbon shaped biopsy marking clip at the site of biopsy in the left breast at 10 o'clock 8 cm from the nipple. 3. Appropriate positioning of the coil shaped biopsy marking clip at the site of biopsy in the left breast at 10 o'clock 7 cm from the nipple. Final Assessment: Post Procedure Mammograms for Marker Placement Electronically Signed   By: Audie Pinto M.D.   On: 05/21/2019 11:33   [place] Lt Breast Bx W Loc Dev 1st Lesion Img Bx Spec US Guide  Addendum Date: 05/25/2019   ADDENDUM REPORT: 05/22/2019 13:11 ADDENDUM: Pathology revealed GRADE III INVASIVE DUCTAL CARCINOMA, DUCTAL CARCINOMA IN SITU, LYMPHOVASCULAR INVASION PRESENT of the RIGHT breast, 6 o'clock . This was found to be concordant by Dr. Audie Pinto. Pathology revealed FIBROADENOMA of the LEFT breast, both locations, 10 o'clock, 8cmfn and 10 o'clock, 7cmfn. This was found to be concordant by Dr. Audie Pinto. Pathology results were discussed with the patient by telephone. The patient reported doing well after the biopsies with tenderness at the sites and minimal bleeding on the RIGHT. Post biopsy instructions and care were reviewed and questions were answered. The patient was encouraged to call [facility] for any additional concerns. The patient was referred to [facility] on May 27, 2019. Recommendation for a bilateral breast MRI due to extremely dense breasts and age. Pathology  results reported by Terie Purser, RN on 05/22/2019. Electronically Signed   By: Audie Pinto M.D.   On: 05/22/2019 13:11   Result Date: 05/25/2019 CLINICAL DATA:  Patient presents for biopsy of a right breast mass as well as two adjacent left breast masses. EXAM: ULTRASOUND GUIDED RIGHT BREAST CORE NEEDLE BIOPSY ULTRASOUND GUIDED LEFT BREAST CORE NEEDLE BIOPSY x2 COMPARISON:  Previous exam(s). FINDINGS: I met with the patient and we discussed the procedure of ultrasound-guided biopsy, including benefits and alternatives. We discussed the high likelihood of a successful procedure. We discussed the risks of the procedure, including infection, bleeding, tissue injury, clip migration, and inadequate sampling. Informed written consent was given. The usual time-out protocol was performed immediately prior to the procedure. 1.  Lesion quadrant: Lower inner quadrant Using sterile technique and 1% Lidocaine as local anesthetic, under direct ultrasound visualization, a 12 gauge spring-loaded device was used to perform biopsy of a right breast mass at 6 o'clock using a lateral approach. At the conclusion of the procedure a ribbon tissue marker clip was deployed into the biopsy cavity. Follow up 2 view mammogram was performed and dictated separately. 2.  Lesion quadrant: Upper inner quadrant Using sterile technique and 1% Lidocaine as local anesthetic, under direct ultrasound visualization, a 12 gauge spring-loaded device was used to perform biopsy of a left breast mass at 10 o'clock 8 cm from the nipple using a lateral approach. At the conclusion of the procedure a ribbon tissue marker clip was deployed into the biopsy cavity. Follow up 2 view mammogram was performed and dictated separately.  3.  Lesion quadrant: Upper inner quadrant Using sterile technique and 1% Lidocaine as local anesthetic, under direct ultrasound visualization, a 12 gauge spring-loaded device was used to perform biopsy of a left breast mass at 10  o'clock 7 cm from the nipple using a lateral approach. At the conclusion of the procedure a coil tissue marker clip was deployed into the biopsy cavity. Follow up 2 view mammogram was performed and dictated separately. IMPRESSION: Ultrasound guided biopsy of a right breast mass at 6 o'clock, a left breast mass at 10 o'clock 8 cm from the nipple and an adjacent left breast mass at 10 o'clock 7 cm from the nipple. No apparent complications. Electronically Signed: By: Audie Pinto M.D. On: 05/21/2019 11:36   [place] Lt Breast Bx W Loc Dev Ea Add Lesion Img Bx Spec US Guide  Addendum Date: 05/25/2019   ADDENDUM REPORT: 05/22/2019 13:11 ADDENDUM: Pathology revealed GRADE III INVASIVE DUCTAL CARCINOMA, DUCTAL CARCINOMA IN SITU, LYMPHOVASCULAR INVASION PRESENT of the RIGHT breast, 6 o'clock . This was found to be concordant by Dr. Audie Pinto. Pathology revealed FIBROADENOMA of the LEFT breast, both locations, 10 o'clock, 8cmfn and 10 o'clock, 7cmfn. This was found to be concordant by Dr. Audie Pinto. Pathology results were discussed with the patient by telephone. The patient reported doing well after the biopsies with tenderness at the sites and minimal bleeding on the RIGHT. Post biopsy instructions and care were reviewed and questions were answered. The patient was encouraged to call [facility] for any additional concerns. The patient was referred to [facility] on May 27, 2019. Recommendation for a bilateral breast MRI due to extremely dense breasts and age. Pathology results reported by Terie Purser, RN on 05/22/2019. Electronically Signed   By: Audie Pinto M.D.   On: 05/22/2019 13:11   Result Date: 05/25/2019 CLINICAL DATA:  Patient presents for biopsy of a right breast mass as well as two adjacent left breast masses. EXAM: ULTRASOUND GUIDED RIGHT BREAST CORE NEEDLE BIOPSY ULTRASOUND  GUIDED LEFT BREAST CORE NEEDLE BIOPSY x2 COMPARISON:  Previous exam(s). FINDINGS: I met with the patient and we discussed the procedure of ultrasound-guided biopsy, including benefits and alternatives. We discussed the high likelihood of a successful procedure. We discussed the risks of the procedure, including infection, bleeding, tissue injury, clip migration, and inadequate sampling. Informed written consent was given. The usual time-out protocol was performed immediately prior to the procedure. 1.  Lesion quadrant: Lower inner quadrant Using sterile technique and 1% Lidocaine as local anesthetic, under direct ultrasound visualization, a 12 gauge spring-loaded device was used to perform biopsy of a right breast mass at 6 o'clock using a lateral approach. At the conclusion of the procedure a ribbon tissue marker clip was deployed into the biopsy cavity. Follow up 2 view mammogram was performed and dictated separately. 2.  Lesion quadrant: Upper inner quadrant Using sterile technique and 1% Lidocaine as local anesthetic, under direct ultrasound visualization, a 12 gauge spring-loaded device was used to perform biopsy of a left breast mass at 10 o'clock 8 cm from the nipple using a lateral approach. At the conclusion of the procedure a ribbon tissue marker clip was deployed into the biopsy cavity. Follow up 2 view mammogram was performed and dictated separately. 3.  Lesion quadrant: Upper inner quadrant Using sterile technique and 1% Lidocaine as local anesthetic, under direct ultrasound visualization, a 12 gauge spring-loaded device was used to  perform biopsy of a left breast mass at 10 o'clock 7 cm from the nipple using a lateral approach. At the conclusion of the procedure a coil tissue marker clip was deployed into the biopsy cavity. Follow up 2 view mammogram was performed and dictated separately. IMPRESSION: Ultrasound guided biopsy of a right breast mass at 6 o'clock, a left breast mass at 10 o'clock 8 cm  from the nipple and an adjacent left breast mass at 10 o'clock 7 cm from the nipple. No apparent complications. Electronically Signed: By: Audie Pinto M.D. On: 05/21/2019 11:36   [place] Rt Breast Bx W Loc Dev 1st Lesion Img Bx Spec US Guide  Addendum Date: 05/25/2019   ADDENDUM REPORT: 05/22/2019 13:11 ADDENDUM: Pathology revealed GRADE III INVASIVE DUCTAL CARCINOMA, DUCTAL CARCINOMA IN SITU, LYMPHOVASCULAR INVASION PRESENT of the RIGHT breast, 6 o'clock . This was found to be concordant by Dr. Audie Pinto. Pathology revealed FIBROADENOMA of the LEFT breast, both locations, 10 o'clock, 8cmfn and 10 o'clock, 7cmfn. This was found to be concordant by Dr. Audie Pinto. Pathology results were discussed with the patient by telephone. The patient reported doing well after the biopsies with tenderness at the sites and minimal bleeding on the RIGHT. Post biopsy instructions and care were reviewed and questions were answered. The patient was encouraged to call [facility] for any additional concerns. The patient was referred to [facility] on May 27, 2019. Recommendation for a bilateral breast MRI due to extremely dense breasts and age. Pathology results reported by Terie Purser, RN on 05/22/2019. Electronically Signed   By: Audie Pinto M.D.   On: 05/22/2019 13:11   Result Date: 05/25/2019 CLINICAL DATA:  Patient presents for biopsy of a right breast mass as well as two adjacent left breast masses. EXAM: ULTRASOUND GUIDED RIGHT BREAST CORE NEEDLE BIOPSY ULTRASOUND GUIDED LEFT BREAST CORE NEEDLE BIOPSY x2 COMPARISON:  Previous exam(s). FINDINGS: I met with the patient and we discussed the procedure of ultrasound-guided biopsy, including benefits and alternatives. We discussed the high likelihood of a successful procedure. We discussed the risks of the procedure, including infection, bleeding,  tissue injury, clip migration, and inadequate sampling. Informed written consent was given. The usual time-out protocol was performed immediately prior to the procedure. 1.  Lesion quadrant: Lower inner quadrant Using sterile technique and 1% Lidocaine as local anesthetic, under direct ultrasound visualization, a 12 gauge spring-loaded device was used to perform biopsy of a right breast mass at 6 o'clock using a lateral approach. At the conclusion of the procedure a ribbon tissue marker clip was deployed into the biopsy cavity. Follow up 2 view mammogram was performed and dictated separately. 2.  Lesion quadrant: Upper inner quadrant Using sterile technique and 1% Lidocaine as local anesthetic, under direct ultrasound visualization, a 12 gauge spring-loaded device was used to perform biopsy of a left breast mass at 10 o'clock 8 cm from the nipple using a lateral approach. At the conclusion of the procedure a ribbon tissue marker clip was deployed into the biopsy cavity. Follow up 2 view mammogram was performed and dictated separately. 3.  Lesion quadrant: Upper inner quadrant Using sterile technique and 1% Lidocaine as local anesthetic, under direct ultrasound visualization, a 12 gauge spring-loaded device was used to perform biopsy of a left breast mass at 10 o'clock 7 cm from the nipple using a lateral approach. At the conclusion of the procedure a coil tissue marker  clip was deployed into the biopsy cavity. Follow up 2 view mammogram was performed and dictated separately. IMPRESSION: Ultrasound guided biopsy of a right breast mass at 6 o'clock, a left breast mass at 10 o'clock 8 cm from the nipple and an adjacent left breast mass at 10 o'clock 7 cm from the nipple. No apparent complications. Electronically Signed: By: Audie Pinto M.D. On: 05/21/2019 11:36      IMPRESSION: Stage IIA, Right Breast LIQ, Invasive Ductal with DCIS and LVI, ER+/ PR+ / Her2+, Grade 3  Patient will be a good candidate for  breast conservation with radiotherapy to right breast. We discussed the general course of radiation, potential side effects, and toxicities with radiation and the patient is interested in this approach. Patient will proceed with neoadjuvant chemotherapy. If she has good response to this, she would likely be a candidate for breast preservation with lumpectomy and sentinel node procedure.   PLAN:  1. Genetics 2. MRI, chest CT, bone scan 3. Port 4. Neoadjuvant Chemotherapy 5. Surgery TBD 6. Adjuvant Radiation Therapy 7. Aromatase Inhibitor   ------------------------------------------------  Blair Promise, PhD, MD  This document serves as a record of services personally performed by Gery Pray, MD. It was created on his behalf by Clerance Lav, a trained medical scribe. The creation of this record is based on the scribe's personal observations and the provider's statements to them. This document has been checked and approved by the attending provider.

## 2019-05-28 ENCOUNTER — Other Ambulatory Visit: Payer: Self-pay

## 2019-05-28 ENCOUNTER — Telehealth: Payer: Self-pay | Admitting: *Deleted

## 2019-05-28 ENCOUNTER — Encounter (HOSPITAL_BASED_OUTPATIENT_CLINIC_OR_DEPARTMENT_OTHER): Payer: Self-pay | Admitting: *Deleted

## 2019-05-28 LAB — GENETIC SCREENING ORDER

## 2019-05-28 NOTE — Telephone Encounter (Signed)
Called pt and discussed HER2 negative by FISH. Discussed BMDC from 11/218/20. Discussed chemo regimen and timing. Confirmed further appts and scans. Denies further questions or concerns regarding dx or treatment care plan. Encourage pt to call with needs. Received verbal understanding.  

## 2019-05-29 ENCOUNTER — Encounter: Payer: Self-pay | Admitting: *Deleted

## 2019-05-29 ENCOUNTER — Ambulatory Visit
Admission: RE | Admit: 2019-05-29 | Discharge: 2019-05-29 | Disposition: A | Discharge status: Home / Self Care | Payer: 59 | Source: Ambulatory Visit | Attending: Internal Medicine | Admitting: Internal Medicine

## 2019-05-29 DIAGNOSIS — R935 Abnormal findings on diagnostic imaging of other abdominal regions, including retroperitoneum: Secondary | ICD-10-CM

## 2019-06-02 ENCOUNTER — Telehealth: Payer: Self-pay | Admitting: Adult Health

## 2019-06-02 NOTE — Telephone Encounter (Signed)
Peer to peer completed on patient Kimberly Mueller, CT chest abdomen and pelvis approved with auth number [id number] and expires in 180 days from today.    Wilber Bihari, NP

## 2019-06-05 ENCOUNTER — Ambulatory Visit (HOSPITAL_COMMUNITY)
Admission: RE | Admit: 2019-06-05 | Discharge: 2019-06-05 | Disposition: A | Discharge status: Home / Self Care | Payer: No Typology Code available for payment source | Source: Ambulatory Visit | Attending: Hematology and Oncology | Admitting: Hematology and Oncology

## 2019-06-05 ENCOUNTER — Other Ambulatory Visit: Payer: Self-pay | Admitting: *Deleted

## 2019-06-05 ENCOUNTER — Inpatient Hospital Stay: Payer: 59

## 2019-06-05 ENCOUNTER — Other Ambulatory Visit: Payer: Self-pay | Admitting: Hematology and Oncology

## 2019-06-05 ENCOUNTER — Inpatient Hospital Stay (HOSPITAL_COMMUNITY): Admission: RE | Admit: 2019-06-05 | Payer: 59 | Source: Ambulatory Visit

## 2019-06-05 ENCOUNTER — Other Ambulatory Visit: Payer: Self-pay

## 2019-06-05 ENCOUNTER — Ambulatory Visit (HOSPITAL_BASED_OUTPATIENT_CLINIC_OR_DEPARTMENT_OTHER)
Admission: RE | Admit: 2019-06-05 | Discharge: 2019-06-05 | Disposition: A | Discharge status: Home / Self Care | Payer: No Typology Code available for payment source | Source: Ambulatory Visit | Attending: Hematology and Oncology | Admitting: Hematology and Oncology

## 2019-06-05 DIAGNOSIS — C50311 Malignant neoplasm of lower-inner quadrant of right female breast: Secondary | ICD-10-CM

## 2019-06-05 DIAGNOSIS — Z170 Estrogen receptor positive status [ER+]: Secondary | ICD-10-CM | POA: Diagnosis not present

## 2019-06-05 MED ORDER — PROCHLORPERAZINE MALEATE 10 MG PO TABS: 10.0000 mg | ORAL_TABLET | Freq: Four times a day (QID) | ORAL | 1 refills | Status: DC | PRN

## 2019-06-05 MED ORDER — GADOBUTROL 1 MMOL/ML IV SOLN
9.0000 mL | Freq: Once | INTRAVENOUS | Status: AC | PRN
  Administered 2019-06-05: 9 mL via INTRAVENOUS

## 2019-06-05 MED ORDER — IOHEXOL 300 MG/ML  SOLN
100.0000 mL | Freq: Once | INTRAMUSCULAR | Status: AC | PRN
  Administered 2019-06-05: 100 mL via INTRAVENOUS

## 2019-06-05 MED ORDER — LORAZEPAM 0.5 MG PO TABS: 0.5000 mg | ORAL_TABLET | Freq: Four times a day (QID) | ORAL | 0 refills | Status: DC | PRN

## 2019-06-05 MED ORDER — DEXAMETHASONE 4 MG PO TABS: 4.0000 mg | ORAL_TABLET | Freq: Every day | ORAL | 0 refills | Status: DC

## 2019-06-05 MED ORDER — SODIUM CHLORIDE (PF) 0.9 % IJ SOLN
INTRAMUSCULAR | Status: AC
  Filled 2019-06-05: qty 50

## 2019-06-05 MED ORDER — LIDOCAINE-PRILOCAINE 2.5-2.5 % EX CREA: TOPICAL_CREAM | CUTANEOUS | 3 refills | Status: DC

## 2019-06-05 MED ORDER — ONDANSETRON HCL 8 MG PO TABS: 8.0000 mg | ORAL_TABLET | Freq: Two times a day (BID) | ORAL | 1 refills | Status: DC | PRN

## 2019-06-05 NOTE — Progress Notes (Signed)
START ON PATHWAY REGIMEN - Breast   Dose-Dense AC q14 days:   A cycle is every 14 days:     Doxorubicin      Cyclophosphamide      Pegfilgrastim-xxxx   **Always confirm dose/schedule in your pharmacy ordering system**    Paclitaxel 80 mg/m2 Weekly:   Administer weekly:     Paclitaxel   **Always confirm dose/schedule in your pharmacy ordering system**    Patient Characteristics: Preoperative or Nonsurgical Candidate (Clinical Staging), Neoadjuvant Therapy followed by Surgery, Invasive Disease, Chemotherapy, HER2 Negative/Unknown/Equivocal, ER Positive Therapeutic Status: Preoperative or Nonsurgical Candidate (Clinical Staging) AJCC M Category: cM0 AJCC Grade: G3 Breast Surgical Plan: Neoadjuvant Therapy followed by Surgery ER Status: Positive (+) AJCC 8 Stage Grouping: IIA HER2 Status: Negative (-) AJCC T Category: cT2 AJCC N Category: cN0 PR Status: Positive (+) Intent of Therapy: Curative Intent, Discussed with Patient 

## 2019-06-05 NOTE — Progress Notes (Signed)
  Echocardiogram 2D Echocardiogram has been performed.  Kimberly Mueller 06/05/2019, 10:35 AM

## 2019-06-06 ENCOUNTER — Other Ambulatory Visit (HOSPITAL_COMMUNITY)
Admission: RE | Admit: 2019-06-06 | Discharge: 2019-06-06 | Disposition: A | Discharge status: Home / Self Care | Payer: 59 | Source: Ambulatory Visit | Attending: General Surgery | Admitting: General Surgery

## 2019-06-06 DIAGNOSIS — Z20828 Contact with and (suspected) exposure to other viral communicable diseases: Secondary | ICD-10-CM | POA: Diagnosis not present

## 2019-06-06 DIAGNOSIS — Z01812 Encounter for preprocedural laboratory examination: Secondary | ICD-10-CM | POA: Diagnosis not present

## 2019-06-06 LAB — SARS CORONAVIRUS 2 (TAT 6-24 HRS): SARS Coronavirus 2: NEGATIVE

## 2019-06-07 NOTE — Progress Notes (Signed)
Patient Care Team: Jilda Panda, MD as PCP - General (Internal Medicine) Nicholas Lose, MD as Consulting Physician (Hematology and Oncology) Rolm Bookbinder, MD as Consulting Physician (General Surgery) Gery Pray, MD as Consulting Physician (Radiation Oncology) Rockwell Germany, RN as Oncology Nurse Navigator Mauro Kaufmann, RN as Oncology Nurse Navigator  DIAGNOSIS:    ICD-10-CM   1. Malignant neoplasm of lower-inner quadrant of right breast of female, estrogen receptor positive ([place])  C50.311    Z17.0     SUMMARY OF ONCOLOGIC HISTORY: Oncology History  Malignant neoplasm of lower-inner quadrant of right breast of female, estrogen receptor positive ([place])  05/25/2019 Initial Diagnosis   Patient palpated a right breast lump for two years. Diagnostic mammogram showed a 4.5cm right breast mass at the 6 o'clock position involving the overlying skin, 2 benign appearing left breast mass at the 10 o'clock position measuring 2.4cm and 2.1cm, and no axillary adenopathy. Biopsy showed: in the left breast, fibroadenoma and no evidence of malignancy, and in the right breast, IDC with DCIS, grade 3.  ER 70%, PR 30%, Ki-67 40%, HER-2 negative by [facility]    06/12/2019 -  Chemotherapy   The patient had DOXOrubicin (ADRIAMYCIN) chemo injection 118 mg, 60 mg/m2 = 118 mg, Intravenous,  Once, 0 of 4 cycles palonosetron (ALOXI) injection 0.25 mg, 0.25 mg, Intravenous,  Once, 0 of 4 cycles pegfilgrastim-jmdb (FULPHILA) injection 6 mg, 6 mg, Subcutaneous,  Once, 0 of 4 cycles cyclophosphamide (CYTOXAN) 1,180 mg in sodium chloride 0.9 % 250 mL chemo infusion, 600 mg/m2 = 1,180 mg, Intravenous,  Once, 0 of 4 cycles PACLitaxel (TAXOL) 156 mg in sodium chloride 0.9 % 250 mL chemo infusion (</= 62m/m2), 80 mg/m2 = 156 mg, Intravenous,  Once, 0 of 12 cycles fosaprepitant (EMEND) 150 mg, dexamethasone (DECADRON) 12 mg in sodium chloride 0.9 % 145 mL IVPB, , Intravenous,  Once, 0 of 4 cycles  for chemotherapy  treatment.      CHIEF COMPLIANT: Follow-up to discuss chemotherapy  INTERVAL HISTORY: BVanesha Athensis a 33y.o. with above-mentioned history of right breast cancer. Abdominal [place] 05[place] showed two liver masses measuring 3.5cm and 1.2cm suspicious for hemangiomas. CT CAP on 06/05/19 showed an indeterminate 3.1cm mass in the left hepatic lobe recommended for MRI. Breast MRI on 06/05/19 showed the known right breast mass and an additional 0.6cm indeterminate mass located laterally to the known mass. Echo on 06/05/19 showed an ejection fraction of 55-60%. She presents to the clinic today to discuss plans for chemotherapy.   REVIEW OF SYSTEMS:   Constitutional: Denies fevers, chills or abnormal weight loss Eyes: Denies blurriness of vision Ears, nose, mouth, throat, and face: Denies mucositis or sore throat Respiratory: Denies cough, dyspnea or wheezes Cardiovascular: Denies palpitation, chest discomfort Gastrointestinal: Denies nausea, heartburn or change in bowel habits Skin: Denies abnormal skin rashes Lymphatics: Denies new lymphadenopathy or easy bruising Neurological: Denies numbness, tingling or new weaknesses Behavioral/Psych: Mood is stable, no new changes  Extremities: No lower extremity edema Breast: denies any pain or lumps or nodules in either breasts All other systems were reviewed with the patient and are negative.  I have reviewed the past medical history, past surgical history, social history and family history with the patient and they are unchanged from previous note.  ALLERGIES:  has No Known Allergies.  MEDICATIONS:  Current Outpatient Medications  Medication Sig Dispense Refill  . Cyanocobalamin (B-12 COMPLIANCE INJECTION) 1000 MCG/ML KIT Inject as directed.    .Marland Kitchendexamethasone (DECADRON) 4  MG tablet Take 1 tablet (4 mg total) by mouth daily. Take 1 tablet day after chemo and 1 tablet 2 days after chemo with food 8 tablet 0  . etonogestrel (NEXPLANON) 68 MG IMPL  implant 1 each by Subdermal route once.    . lidocaine-prilocaine (EMLA) cream Apply to affected area once 30 g 3  . LORazepam (ATIVAN) 0.5 MG tablet Take 1 tablet (0.5 mg total) by mouth every 6 (six) hours as needed (Nausea or vomiting). 30 tablet 0  . Multiple Vitamin (MULTIVITAMIN) tablet Take 1 tablet by mouth daily.    . ondansetron (ZOFRAN) 8 MG tablet Take 1 tablet (8 mg total) by mouth 2 (two) times daily as needed. Start on the third day after chemotherapy. 30 tablet 1  . prochlorperazine (COMPAZINE) 10 MG tablet Take 1 tablet (10 mg total) by mouth every 6 (six) hours as needed (Nausea or vomiting). 30 tablet 1  . vortioxetine HBr (TRINTELLIX) 5 MG TABS tablet Take 5 mg by mouth daily.     No current facility-administered medications for this visit.     PHYSICAL EXAMINATION: ECOG PERFORMANCE STATUS: 1 - Symptomatic but completely ambulatory  Vitals:   06/08/19 0810  BP: 120/80  Pulse: 68  Resp: 20  Temp: 98.2 F (36.8 C)  SpO2: 100%   Filed Weights   06/08/19 0810  Weight: 183 lb 6.4 oz (83.2 kg)    GENERAL: alert, no distress and comfortable SKIN: skin color, texture, turgor are normal, no rashes or significant lesions EYES: normal, Conjunctiva are pink and non-injected, sclera clear OROPHARYNX: no exudate, no erythema and lips, buccal mucosa, and tongue normal  NECK: supple, thyroid normal size, non-tender, without nodularity LYMPH: no palpable lymphadenopathy in the cervical, axillary or inguinal LUNGS: clear to auscultation and percussion with normal breathing effort HEART: regular rate & rhythm and no murmurs and no lower extremity edema ABDOMEN: abdomen soft, non-tender and normal bowel sounds MUSCULOSKELETAL: no cyanosis of digits and no clubbing  NEURO: alert & oriented x 3 with fluent speech, no focal motor/sensory deficits EXTREMITIES: No lower extremity edema  LABORATORY DATA:  I have reviewed the data as listed CMP Latest Ref Rng & Units 06/08/2019  05/27/2019  Glucose 70 - 99 mg/dL 91 102(H)  BUN 6 - 20 mg/dL 5(L) 9  Creatinine 0.44 - 1.00 mg/dL 0.71 0.77  Sodium 135 - 145 mmol/L 141 140  Potassium 3.5 - 5.1 mmol/L 3.8 3.7  Chloride 98 - 111 mmol/L 105 105  CO2 22 - 32 mmol/L 21(L) 19(L)  Calcium 8.9 - 10.3 mg/dL 9.2 9.4  Total Protein 6.5 - 8.1 g/dL 6.8 7.4  Total Bilirubin 0.3 - 1.2 mg/dL 0.8 0.8  Alkaline Phos 38 - 126 U/L 66 69  AST 15 - 41 U/L 25 22  ALT 0 - 44 U/L 26 27    Lab Results  Component Value Date   WBC 4.8 06/08/2019   HGB 14.0 06/08/2019   HCT 42.0 06/08/2019   MCV 86.6 06/08/2019   PLT 275 06/08/2019   NEUTROABS 2.6 06/08/2019    ASSESSMENT & PLAN:  Malignant neoplasm of lower-inner quadrant of right breast of female, estrogen receptor positive ([place]) 05/15/2019:Patient palpated a right breast lump for two years. Diagnostic mammogram showed a 4.5cm right breast mass at the 6 o'clock position involving the overlying skin, 2 benign appearing left breast mass at the 10 o'clock position measuring 2.4cm and 2.1cm, and no axillary adenopathy. Biopsy showed: in the left breast, fibroadenoma and no evidence  of malignancy, and in the right breast, IDC with DCIS, grade 3.  ER 70%, PR 30%, Ki-67 40%, HER-2 negative ratio 1.41 T2 N0 stage IIa clinical stage  Treatment plan: 1.  Neoadjuvant chemotherapy with dose dense Adriamycin and Cytoxan x4 followed by Taxol x12 starting 06/10/2019 2.  Breast conserving surgery with sentinel lymph node biopsy 3.  Adjuvant radiation 4.  Follow-up adjuvant antiestrogen therapy ----------------------------------------------------------------------------------------------------------------------------------------------- Chemo counseling: Chemotherapy Counseling: I discussed the risks and benefits of chemotherapy including the risks of nausea/ vomiting, risk of infection from low WBC count, fatigue due to chemo or anemia, bruising or bleeding due to low platelets, mouth sores, loss/ change  in taste and decreased appetite. Liver and kidney function will be monitored through out chemotherapy as abnormalities in liver and kidney function may be a side effect of treatment. Cardiac dysfunction due to Adriamycin was discussed in detail. Risk of permanent bone marrow dysfunction and leukemia due to chemo were also discussed.  CT CAP: Liver lesion 3.1 cm indeterminate, liver MRI to be done Breast MRI: 06/05/2019: 3.5 x 3.1 x 2.9 cm biopsy-proven cancer, 6 mm oval mass posterior to the cancer.  No lymph nodes.  Left breast biopsy-proven fibroadenomas 2.4 cm and 1.6 cm.  Current treatment: Cycle 1 day 1 dose dense Adriamycin and Cytoxan will be started 06/10/2019 Chemo education completed, chemo consent obtained Labs reviewed, antiemetics reviewed Return to clinic in 1 week for toxicity check   No orders of the defined types were placed in this encounter.  The patient has a good understanding of the overall plan. she agrees with it. she will call with any problems that may develop before the next visit here.  Nicholas Lose, MD 06/08/2019  Julious Oka Dorshimer, am acting as scribe for Dr. Nicholas Lose.  I have reviewed the above documentation for accuracy and completeness, and I agree with the above.

## 2019-06-08 ENCOUNTER — Other Ambulatory Visit: Payer: Self-pay

## 2019-06-08 ENCOUNTER — Inpatient Hospital Stay: Payer: 59

## 2019-06-08 ENCOUNTER — Inpatient Hospital Stay (HOSPITAL_BASED_OUTPATIENT_CLINIC_OR_DEPARTMENT_OTHER): Payer: 59 | Admitting: Hematology and Oncology

## 2019-06-08 ENCOUNTER — Telehealth: Payer: Self-pay | Admitting: *Deleted

## 2019-06-08 DIAGNOSIS — C50311 Malignant neoplasm of lower-inner quadrant of right female breast: Secondary | ICD-10-CM | POA: Diagnosis not present

## 2019-06-08 DIAGNOSIS — Z170 Estrogen receptor positive status [ER+]: Secondary | ICD-10-CM

## 2019-06-08 DIAGNOSIS — R293 Abnormal posture: Secondary | ICD-10-CM | POA: Diagnosis present

## 2019-06-08 LAB — CMP (CANCER CENTER ONLY)
ALT: 26 U/L (ref 0–44)
AST: 25 U/L (ref 15–41)
Albumin: 4 g/dL (ref 3.5–5.0)
Alkaline Phosphatase: 66 U/L (ref 38–126)
Anion gap: 15 (ref 5–15)
BUN: 5 mg/dL — ABNORMAL LOW (ref 6–20)
CO2: 21 mmol/L — ABNORMAL LOW (ref 22–32)
Calcium: 9.2 mg/dL (ref 8.9–10.3)
Chloride: 105 mmol/L (ref 98–111)
Creatinine: 0.71 mg/dL (ref 0.44–1.00)
GFR, Est AFR Am: >60 mL/min (ref >60)
GFR, Estimated: >60 mL/min (ref >60)
Glucose, Bld: 91 mg/dL (ref 70–99)
Potassium: 3.8 mmol/L (ref 3.5–5.1)
Sodium: 141 mmol/L (ref 135–145)
Total Bilirubin: 0.8 mg/dL (ref 0.3–1.2)
Total Protein: 6.8 g/dL (ref 6.5–8.1)

## 2019-06-08 LAB — CBC WITH DIFFERENTIAL (CANCER CENTER ONLY)
Abs Immature Granulocytes: 0.01 10*3/uL (ref 0.00–0.07)
Basophils Absolute: 0 10*3/uL (ref 0.0–0.1)
Basophils Relative: 1 %
Eosinophils Absolute: 0.1 10*3/uL (ref 0.0–0.5)
Eosinophils Relative: 3 %
HCT: 42 % (ref 36.0–46.0)
Hemoglobin: 14 g/dL (ref 12.0–15.0)
Immature Granulocytes: 0 %
Lymphocytes Relative: 28 %
Lymphs Abs: 1.4 10*3/uL (ref 0.7–4.0)
MCH: 28.9 pg (ref 26.0–34.0)
MCHC: 33.3 g/dL (ref 30.0–36.0)
MCV: 86.6 fL (ref 80.0–100.0)
Monocytes Absolute: 0.7 10*3/uL (ref 0.1–1.0)
Monocytes Relative: 14 %
Neutro Abs: 2.6 10*3/uL (ref 1.7–7.7)
Neutrophils Relative %: 54 %
Platelet Count: 275 10*3/uL (ref 150–400)
RBC: 4.85 MIL/uL (ref 3.87–5.11)
RDW: 14.5 % (ref 11.5–15.5)
WBC Count: 4.8 10*3/uL (ref 4.0–10.5)
nRBC: 0 % (ref 0.0–0.2)

## 2019-06-08 NOTE — Assessment & Plan Note (Signed)
05/15/2019:Patient palpated a right breast lump for two years. Diagnostic mammogram showed a 4.5cm right breast mass at the 6 o'clock position involving the overlying skin, 2 benign appearing left breast mass at the 10 o'clock position measuring 2.4cm and 2.1cm, and no axillary adenopathy. Biopsy showed: in the left breast, fibroadenoma and no evidence of malignancy, and in the right breast, IDC with DCIS, grade 3.  ER 70%, PR 30%, Ki-67 40%, HER-2 negative ratio 1.41 T2 N0 stage IIa clinical stage  Treatment plan: 1.  Neoadjuvant chemotherapy with dose dense Adriamycin and Cytoxan x4 followed by Taxol x12 starting 06/10/2019 2.  Breast conserving surgery with sentinel lymph node biopsy 3.  Adjuvant radiation 4.  Follow-up adjuvant antiestrogen therapy ----------------------------------------------------------------------------------------------------------------------------------------------- Current treatment: Cycle 1 day 1 dose dense Adriamycin and Cytoxan Chemo education completed, chemo consent obtained Labs reviewed, antiemetics reviewed Return to clinic in 1 week for toxicity check

## 2019-06-08 NOTE — Telephone Encounter (Signed)
Called pt and discussed recommendations for US/bx of right breast mass. Received verbal understanding. Denies further questions at this time.

## 2019-06-08 NOTE — Anesthesia Preprocedure Evaluation (Addendum)
Anesthesia Evaluation  Patient identified by MRN, date of birth, ID band Patient awake    Reviewed: Allergy & Precautions, NPO status , Patient's Chart, lab work & pertinent test results  History of Anesthesia Complications Negative for: history of anesthetic complications  Airway Mallampati: II  TM Distance: >3 FB Neck ROM: Full    Dental  (+) Teeth Intact, Dental Advisory Given   Pulmonary former smoker,    Pulmonary exam normal breath sounds clear to auscultation       Cardiovascular Exercise Tolerance: Good negative cardio ROS Normal cardiovascular exam Rhythm:Regular Rate:Normal     Neuro/Psych negative neurological ROS     GI/Hepatic Neg liver ROS, H/o gastric bypass   Endo/Other  Obesity   Renal/GU negative Renal ROS     Musculoskeletal negative musculoskeletal ROS (+)   Abdominal   Peds  Hematology negative hematology ROS (+)   Anesthesia Other Findings Day of surgery medications reviewed with the patient.  Breast cancer  Reproductive/Obstetrics                            Anesthesia Physical Anesthesia Plan  ASA: II  Anesthesia Plan: General   Post-op Pain Management:    Induction:   PONV Risk Score and Plan: 3 and Midazolam, Dexamethasone and Ondansetron  Airway Management Planned: LMA  Additional Equipment:   Intra-op Plan:   Post-operative Plan: Extubation in OR  Informed Consent: I have reviewed the patients History and Physical, chart, labs and discussed the procedure including the risks, benefits and alternatives for the proposed anesthesia with the patient or authorized representative who has indicated his/her understanding and acceptance.     Dental advisory given  Plan Discussed with: CRNA  Anesthesia Plan Comments:        Anesthesia Quick Evaluation

## 2019-06-09 ENCOUNTER — Encounter (HOSPITAL_BASED_OUTPATIENT_CLINIC_OR_DEPARTMENT_OTHER)
Admission: RE | Disposition: A | Discharge status: Home / Self Care | Payer: Self-pay | Source: Home / Self Care | Attending: General Surgery

## 2019-06-09 ENCOUNTER — Ambulatory Visit (HOSPITAL_BASED_OUTPATIENT_CLINIC_OR_DEPARTMENT_OTHER): Payer: No Typology Code available for payment source | Admitting: Anesthesiology

## 2019-06-09 ENCOUNTER — Other Ambulatory Visit: Payer: Self-pay | Admitting: *Deleted

## 2019-06-09 ENCOUNTER — Ambulatory Visit (HOSPITAL_BASED_OUTPATIENT_CLINIC_OR_DEPARTMENT_OTHER)
Admission: RE | Admit: 2019-06-09 | Discharge: 2019-06-09 | Disposition: A | Discharge status: Home / Self Care | Payer: No Typology Code available for payment source | Attending: General Surgery | Admitting: General Surgery

## 2019-06-09 ENCOUNTER — Other Ambulatory Visit: Payer: Self-pay

## 2019-06-09 ENCOUNTER — Encounter (HOSPITAL_BASED_OUTPATIENT_CLINIC_OR_DEPARTMENT_OTHER): Payer: Self-pay

## 2019-06-09 ENCOUNTER — Ambulatory Visit (HOSPITAL_COMMUNITY): Payer: No Typology Code available for payment source

## 2019-06-09 DIAGNOSIS — E669 Obesity, unspecified: Secondary | ICD-10-CM | POA: Insufficient documentation

## 2019-06-09 DIAGNOSIS — Z170 Estrogen receptor positive status [ER+]: Secondary | ICD-10-CM

## 2019-06-09 DIAGNOSIS — Z6832 Body mass index (BMI) 32.0-32.9, adult: Secondary | ICD-10-CM | POA: Insufficient documentation

## 2019-06-09 DIAGNOSIS — Z95828 Presence of other vascular implants and grafts: Secondary | ICD-10-CM

## 2019-06-09 DIAGNOSIS — Z9884 Bariatric surgery status: Secondary | ICD-10-CM | POA: Insufficient documentation

## 2019-06-09 DIAGNOSIS — C50311 Malignant neoplasm of lower-inner quadrant of right female breast: Secondary | ICD-10-CM | POA: Insufficient documentation

## 2019-06-09 DIAGNOSIS — Z87891 Personal history of nicotine dependence: Secondary | ICD-10-CM | POA: Diagnosis not present

## 2019-06-09 HISTORY — DX: Malignant (primary) neoplasm, unspecified: C80.1

## 2019-06-09 HISTORY — PX: PORTACATH PLACEMENT: SHX2246

## 2019-06-09 SURGERY — INSERTION, TUNNELED CENTRAL VENOUS DEVICE, WITH PORT
Anesthesia: General | Site: Chest | Laterality: Right

## 2019-06-09 MED ORDER — KETOROLAC TROMETHAMINE 15 MG/ML IJ SOLN
INTRAMUSCULAR | Status: AC
  Filled 2019-06-09: qty 1

## 2019-06-09 MED ORDER — CEFAZOLIN SODIUM-DEXTROSE 2-4 GM/100ML-% IV SOLN
2.0000 g | INTRAVENOUS | Status: AC
  Administered 2019-06-09: 07:00:00 2 g via INTRAVENOUS

## 2019-06-09 MED ORDER — GABAPENTIN 100 MG PO CAPS
ORAL_CAPSULE | ORAL | Status: AC
  Filled 2019-06-09: qty 1

## 2019-06-09 MED ORDER — ACETAMINOPHEN 500 MG PO TABS
1000.0000 mg | ORAL_TABLET | ORAL | Status: AC
  Administered 2019-06-09: 07:00:00 1000 mg via ORAL

## 2019-06-09 MED ORDER — GABAPENTIN 100 MG PO CAPS
100.0000 mg | ORAL_CAPSULE | ORAL | Status: AC
  Administered 2019-06-09: 07:00:00 100 mg via ORAL

## 2019-06-09 MED ORDER — MIDAZOLAM HCL 2 MG/2ML IJ SOLN
1.0000 mg | INTRAMUSCULAR | Status: DC | PRN
  Administered 2019-06-09: 2 mg via INTRAVENOUS

## 2019-06-09 MED ORDER — DEXAMETHASONE SODIUM PHOSPHATE 4 MG/ML IJ SOLN
INTRAMUSCULAR | Status: DC | PRN
  Administered 2019-06-09: 5 mg via INTRAVENOUS

## 2019-06-09 MED ORDER — FENTANYL CITRATE (PF) 100 MCG/2ML IJ SOLN: 25.0000 ug | INTRAMUSCULAR | Status: DC | PRN

## 2019-06-09 MED ORDER — BUPIVACAINE HCL (PF) 0.25 % IJ SOLN
INTRAMUSCULAR | Status: DC | PRN
  Administered 2019-06-09: 9 mL

## 2019-06-09 MED ORDER — HEPARIN (PORCINE) IN NACL 2-0.9 UNITS/ML
INTRAMUSCULAR | Status: AC | PRN
  Administered 2019-06-09: 1

## 2019-06-09 MED ORDER — ENSURE PRE-SURGERY PO LIQD: 296.0000 mL | Freq: Once | ORAL | Status: DC

## 2019-06-09 MED ORDER — OXYCODONE HCL 5 MG PO TABS: 5.0000 mg | ORAL_TABLET | Freq: Four times a day (QID) | ORAL | 0 refills | Status: DC | PRN

## 2019-06-09 MED ORDER — LACTATED RINGERS IV SOLN
INTRAVENOUS | Status: DC
  Administered 2019-06-09: 07:00:00 via INTRAVENOUS

## 2019-06-09 MED ORDER — KETOROLAC TROMETHAMINE 15 MG/ML IJ SOLN
15.0000 mg | INTRAMUSCULAR | Status: AC
  Administered 2019-06-09: 15 mg via INTRAVENOUS

## 2019-06-09 MED ORDER — PROMETHAZINE HCL 25 MG/ML IJ SOLN: 6.2500 mg | INTRAMUSCULAR | Status: DC | PRN

## 2019-06-09 MED ORDER — FENTANYL CITRATE (PF) 100 MCG/2ML IJ SOLN
50.0000 ug | INTRAMUSCULAR | Status: DC | PRN
  Administered 2019-06-09: 100 ug via INTRAVENOUS

## 2019-06-09 MED ORDER — PROPOFOL 10 MG/ML IV BOLUS
INTRAVENOUS | Status: DC | PRN
  Administered 2019-06-09: 200 mg via INTRAVENOUS

## 2019-06-09 MED ORDER — ONDANSETRON HCL 4 MG/2ML IJ SOLN
INTRAMUSCULAR | Status: DC | PRN
  Administered 2019-06-09: 4 mg via INTRAVENOUS

## 2019-06-09 MED ORDER — HEPARIN SOD (PORK) LOCK FLUSH 100 UNIT/ML IV SOLN
INTRAVENOUS | Status: DC | PRN
  Administered 2019-06-09: 500 [IU]

## 2019-06-09 MED ORDER — CEFAZOLIN SODIUM-DEXTROSE 2-4 GM/100ML-% IV SOLN
INTRAVENOUS | Status: AC
  Filled 2019-06-09: qty 100

## 2019-06-09 MED ORDER — ACETAMINOPHEN 500 MG PO TABS
ORAL_TABLET | ORAL | Status: AC
  Filled 2019-06-09: qty 2

## 2019-06-09 MED ORDER — LIDOCAINE HCL (CARDIAC) PF 100 MG/5ML IV SOSY
PREFILLED_SYRINGE | INTRAVENOUS | Status: DC | PRN
  Administered 2019-06-09: 60 mg via INTRAVENOUS

## 2019-06-09 SURGICAL SUPPLY — 53 items
BAG DECANTER FOR FLEXI CONT (MISCELLANEOUS) ×3 IMPLANT
BENZOIN TINCTURE PRP APPL 2/3 (GAUZE/BANDAGES/DRESSINGS) ×3 IMPLANT
BLADE SURG 11 STRL SS (BLADE) ×3 IMPLANT
BLADE SURG 15 STRL LF DISP TIS (BLADE) ×1 IMPLANT
BLADE SURG 15 STRL SS (BLADE) ×2
CANISTER SUCT 1200ML W/VALVE (MISCELLANEOUS) IMPLANT
CHLORAPREP W/TINT 26 (MISCELLANEOUS) ×3 IMPLANT
CLOSURE WOUND 1/2 X4 (GAUZE/BANDAGES/DRESSINGS) ×1
COVER BACK TABLE REUSABLE LG (DRAPES) ×3 IMPLANT
COVER MAYO STAND REUSABLE (DRAPES) ×3 IMPLANT
COVER PROBE 5X48 (MISCELLANEOUS) ×2
COVER WAND RF STERILE (DRAPES) IMPLANT
DECANTER SPIKE VIAL GLASS SM (MISCELLANEOUS) IMPLANT
DERMABOND ADVANCED (GAUZE/BANDAGES/DRESSINGS) ×2
DERMABOND ADVANCED .7 DNX12 (GAUZE/BANDAGES/DRESSINGS) ×1 IMPLANT
DRAPE C-ARM 42X72 X-RAY (DRAPES) ×3 IMPLANT
DRAPE LAPAROSCOPIC ABDOMINAL (DRAPES) ×3 IMPLANT
DRAPE UTILITY XL STRL (DRAPES) ×3 IMPLANT
DRSG TEGADERM 4X4.75 (GAUZE/BANDAGES/DRESSINGS) ×6 IMPLANT
ELECT COATED BLADE 2.86 ST (ELECTRODE) ×3 IMPLANT
ELECT REM PT RETURN 9FT ADLT (ELECTROSURGICAL) ×3
ELECTRODE REM PT RTRN 9FT ADLT (ELECTROSURGICAL) ×1 IMPLANT
GAUZE SPONGE 4X4 12PLY STRL LF (GAUZE/BANDAGES/DRESSINGS) ×3 IMPLANT
GLOVE BIO SURGEON STRL SZ7 (GLOVE) ×3 IMPLANT
GLOVE BIOGEL PI IND STRL 6.5 (GLOVE) ×1 IMPLANT
GLOVE BIOGEL PI IND STRL 7.5 (GLOVE) ×1 IMPLANT
GLOVE BIOGEL PI INDICATOR 6.5 (GLOVE) ×2
GLOVE BIOGEL PI INDICATOR 7.5 (GLOVE) ×2
GLOVE ECLIPSE 6.5 STRL STRAW (GLOVE) ×3 IMPLANT
GOWN STRL REUS W/ TWL LRG LVL3 (GOWN DISPOSABLE) ×2 IMPLANT
GOWN STRL REUS W/TWL LRG LVL3 (GOWN DISPOSABLE) ×4
IV KIT MINILOC 20X1 SAFETY (NEEDLE) IMPLANT
KIT CVR 48X5XPRB PLUP LF (MISCELLANEOUS) ×1 IMPLANT
KIT PORT POWER 8FR ISP CVUE (Port) ×3 IMPLANT
NDL SAFETY ECLIPSE 18X1.5 (NEEDLE) IMPLANT
NEEDLE HYPO 18GX1.5 SHARP (NEEDLE)
NEEDLE HYPO 25X1 1.5 SAFETY (NEEDLE) ×3 IMPLANT
PACK BASIN DAY SURGERY FS (CUSTOM PROCEDURE TRAY) ×3 IMPLANT
PENCIL SMOKE EVACUATOR (MISCELLANEOUS) ×3 IMPLANT
SLEEVE SCD COMPRESS KNEE MED (MISCELLANEOUS) ×3 IMPLANT
STRIP CLOSURE SKIN 1/2X4 (GAUZE/BANDAGES/DRESSINGS) ×2 IMPLANT
SUT MNCRL AB 4-0 PS2 18 (SUTURE) ×3 IMPLANT
SUT PROLENE 2 0 SH DA (SUTURE) ×3 IMPLANT
SUT SILK 2 0 TIES 17X18 (SUTURE)
SUT SILK 2-0 18XBRD TIE BLK (SUTURE) IMPLANT
SUT VIC AB 3-0 SH 27 (SUTURE) ×2
SUT VIC AB 3-0 SH 27X BRD (SUTURE) ×1 IMPLANT
SYR 5ML LUER SLIP (SYRINGE) ×3 IMPLANT
SYR CONTROL 10ML LL (SYRINGE) ×3 IMPLANT
TOWEL GREEN STERILE FF (TOWEL DISPOSABLE) ×3 IMPLANT
TUBE CONNECTING 20'X1/4 (TUBING)
TUBE CONNECTING 20X1/4 (TUBING) IMPLANT
YANKAUER SUCT BULB TIP NO VENT (SUCTIONS) IMPLANT

## 2019-06-09 NOTE — Transfer of Care (Signed)
Immediate Anesthesia Transfer of Care Note  Patient: Kimberly Mueller  Procedure(s) Performed: INSERTION PORT-A-CATH WITH ULTRASOUND (Right Chest)  Patient Location: PACU  Anesthesia Type:General  Level of Consciousness: drowsy and patient cooperative  Airway & Oxygen Therapy: Patient Spontanous Breathing and Patient connected to face mask oxygen  Post-op Assessment: Report given to RN and Post -op Vital signs reviewed and stable  Post vital signs: Reviewed and stable  Last Vitals:  Vitals Value Taken Time  BP    Temp    Pulse 67 06/09/19 0816  Resp    SpO2 98 % 06/09/19 0816  Vitals shown include unvalidated device data.  Last Pain:  Vitals:   06/09/19 0630  TempSrc: Oral  PainSc: 0-No pain      Patients Stated Pain Goal: 5 ([id number])  Complications: No apparent anesthesia complications

## 2019-06-09 NOTE — Discharge Instructions (Signed)
PORT-A-CATH: POST OP INSTRUCTIONS  Always review your discharge instruction sheet given to you by the facility where your surgery was performed.   1. A prescription for pain medication may be given to you upon discharge. Take your pain medication as prescribed, if needed. If narcotic pain medicine is not needed, then you make take acetaminophen (Tylenol) or ibuprofen (Advil) as needed.  2. Take your usually prescribed medications unless otherwise directed. 3. If you need a refill on your pain medication, please contact our office. All narcotic pain medicine now requires a paper prescription.  Phoned in and fax refills are no longer allowed by law.  Prescriptions will not be filled after 5 pm or on weekends.  4. You should follow a light diet for the remainder of the day after your procedure. 5. Most patients will experience some mild swelling and/or bruising in the area of the incision. It may take several days to resolve. 6. It is common to experience some constipation if taking pain medication after surgery. Increasing fluid intake and taking a stool softener (such as Colace) will usually help or prevent this problem from occurring. A mild laxative (Milk of Magnesia or Miralax) should be taken according to package directions if there are no bowel movements after 48 hours.  7. Unless discharge instructions indicate otherwise, you may remove your bandages 48 hours after surgery, and you may shower at that time. You may have steri-strips (small white skin tapes) in place directly over the incision.  These strips should be left on the skin for 7-10 days.  If your surgeon used Dermabond (skin glue) on the incision, you may shower in 24 hours.  The glue will flake off over the next 2-3 weeks.  8. If your port is left accessed at the end of surgery (needle left in port), the dressing cannot get wet and should only by changed by a healthcare professional. When the port is no longer accessed (when the  needle has been removed), follow step 7.   9. ACTIVITIES:  Limit activity involving your arms for the next 72 hours. Do no strenuous exercise or activity for 1 week. You may drive when you are no longer taking prescription pain medication, you can comfortably wear a seatbelt, and you can maneuver your car. 10.You may need to see your doctor in the office for a follow-up appointment.  Please       check with your doctor.  11.When you receive a new Port-a-Cath, you will get a product guide and        ID card.  Please keep them in case you need them.  WHEN TO CALL YOUR DOCTOR [phone]): 1. Fever over 101.0 2. Chills 3. Continued bleeding from incision 4. Increased redness and tenderness at the site 5. Shortness of breath, difficulty breathing   The clinic staff is available to answer your questions during regular business hours. Please dont hesitate to call and ask to speak to one of the nurses or medical assistants for clinical concerns. If you have a medical emergency, go to the nearest emergency room or call 911.  A surgeon from [facility] is always on call at the hospital.     For further information, please visit [email or web address]    No Tylenol until 12:45 PM on 06/09/2019. No Ibuprofen until 3:45 PM on 06/09/2019.    Post Anesthesia Home Care Instructions  Activity: Get plenty of rest for the remainder of the day. A responsible individual must stay with  you for 24 hours following the procedure.  For the next 24 hours, DO NOT: -Drive a car -Paediatric nurse -Drink alcoholic beverages -Take any medication unless instructed by your physician -Make any legal decisions or sign important papers.  Meals: Start with liquid foods such as gelatin or soup. Progress to regular foods as tolerated. Avoid greasy, spicy, heavy foods. If nausea and/or vomiting occur, drink only clear liquids until the nausea and/or vomiting subsides. Call your physician if  vomiting continues.  Special Instructions/Symptoms: Your throat may feel dry or sore from the anesthesia or the breathing tube placed in your throat during surgery. If this causes discomfort, gargle with warm salt water. The discomfort should disappear within 24 hours.  If you had a scopolamine patch placed behind your ear for the management of post- operative nausea and/or vomiting:  1. The medication in the patch is effective for 72 hours, after which it should be removed.  Wrap patch in a tissue and discard in the trash. Wash hands thoroughly with soap and water. 2. You may remove the patch earlier than 72 hours if you experience unpleasant side effects which may include dry mouth, dizziness or visual disturbances. 3. Avoid touching the patch. Wash your hands with soap and water after contact with the patch.

## 2019-06-09 NOTE — H&P (Addendum)
6 yof referred by Dr Lindi Adie for right breast cancer. she has known right breast mass times two years and new left breast mass there as well. she recently has had lap gastric bypass in [place] last month that she is recovering well from. she had a liver mass noted on laparoscopy that has not been evaluated. she has no discharge present. she then was evaluated with dx mm that shows a medial inferior right breast mass. on [place] there is a 4.5x3.4x2.3 cm mass, the axilla is negative on [place]. biopsy of the left sided masses are FA. 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 she is here to discuss options  Past Surgical History Tawni Pummel, RN; 05/27/2019 7:27 AM) Breast Biopsy  Bilateral. Gastric Bypass  Ventral / Umbilical Hernia Surgery  Bilateral.  Diagnostic Studies History Tawni Pummel, RN; 05/27/2019 7:27 AM) Colonoscopy  never Mammogram  within last year Pap Smear  1-5 years ago  Medication History Tawni Pummel, RN; 05/27/2019 7:27 AM) Medications Reconciled  Social History Tawni Pummel, RN; 05/27/2019 7:27 AM) No alcohol use  No caffeine use  No drug use  Tobacco use  Former smoker.  Family History Tawni Pummel, RN; 05/27/2019 7:27 AM) Alcohol Abuse  Family Members In General. Heart Disease  Family Members In General. Heart disease in female family member before age 32  Hypertension  Family Members In General. Melanoma  Family Members In General. Prostate Cancer  Father.  Pregnancy / Birth History Tawni Pummel, RN; 05/27/2019 7:27 AM) Age at menarche  53 years. Contraceptive History  Contraceptive implant, Depo-provera, Oral contraceptives. Gravida  0 Irregular periods   Other Problems Tawni Pummel, RN; 05/27/2019 7:27 AM) Anxiety Disorder  Gastroesophageal Reflux Disease  Umbilical Hernia Repair    Review of Systems Sunday Spillers Ledford RN; 05/27/2019 7:27 AM) General Not  Present- Appetite Loss, Chills, Fatigue, Fever, Night Sweats, Weight Gain and Weight Loss. Skin Not Present- Change in Wart/Mole, Dryness, Hives, Jaundice, New Lesions, Non-Healing Wounds, Rash and Ulcer. HEENT Not Present- Earache, Hearing Loss, Hoarseness, Nose Bleed, Oral Ulcers, Ringing in the Ears, Seasonal Allergies, Sinus Pain, Sore Throat, Visual Disturbances, Wears glasses/contact lenses and Yellow Eyes. Respiratory Not Present- Bloody sputum, Chronic Cough, Difficulty Breathing, Snoring and Wheezing. Breast Present- Breast Mass. Not Present- Breast Pain, Nipple Discharge and Skin Changes. Cardiovascular Not Present- Chest Pain, Difficulty Breathing Lying Down, Leg Cramps, Palpitations, Rapid Heart Rate, Shortness of Breath and Swelling of Extremities. Gastrointestinal Not Present- Abdominal Pain, Bloating, Bloody Stool, Change in Bowel Habits, Chronic diarrhea, Constipation, Difficulty Swallowing, Excessive gas, Gets full quickly at meals, Hemorrhoids, Indigestion, Nausea, Rectal Pain and Vomiting. Female Genitourinary Not Present- Frequency, Nocturia, Painful Urination, Pelvic Pain and Urgency. Musculoskeletal Not Present- Back Pain, Joint Pain, Joint Stiffness, Muscle Pain, Muscle Weakness and Swelling of Extremities. Neurological Not Present- Decreased Memory, Fainting, Headaches, Numbness, Seizures, Tingling, Tremor, Trouble walking and Weakness. Psychiatric Not Present- Anxiety, Bipolar, Change in Sleep Pattern, Depression, Fearful and Frequent crying. Endocrine Not Present- Cold Intolerance, Excessive Hunger, Hair Changes, Heat Intolerance, Hot flashes and New Diabetes. Hematology Not Present- Blood Thinners, Easy Bruising, Excessive bleeding, Gland problems, HIV and Persistent Infections.   Physical Exam Rolm Bookbinder MD; 05/27/2019 10:29 AM) General Mental Status-Alert. Orientation-Oriented X3. Head and Neck Trachea-midline. Thyroid Gland Characteristics - normal  size and consistency. Eye Sclera/Conjunctiva - Bilateral-Normal. Chest and Lung Exam Chest and lung exam reveals -quiet, even and easy respiratory effort with no use  of accessory muscles. Breast Nipples-No Discharge. Note: right breast 6 oclock mass measuring 4 x 4 cm , no skin or nac involvement, mobile, nontender Neurologic Neurologic evaluation reveals -alert and oriented x 3 with no impairment of recent or remote memory. Lymphatic Head & Neck General Head & Neck Lymphatics: Bilateral - Description - Normal. Axillary General Axillary Region: Bilateral - Description - Normal. 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 we discussed primary chemotherapy as best plan although her 2 pending and i wouldnt necessarily expect complete response. she will need chemo due to size and young age 37 and this may facilitate less surgery. this will depend on response and genetic testing. we discussed sn biopsy, lumpectomy and mastectomy today. I also discussed liver mass (I think after looking at picture this may be hemangioma/fnh?). she will need staging and biopsy of this liver mass as well. I will plan on port placmement next week prior to chemotherapy and we discussed this today.

## 2019-06-09 NOTE — Anesthesia Postprocedure Evaluation (Signed)
Anesthesia Post Note  Patient: [name]  Procedure(s) Performed: INSERTION PORT-A-CATH WITH ULTRASOUND (Right Chest)     Patient location during evaluation: PACU Anesthesia Type: General Level of consciousness: awake and alert Pain management: pain level controlled Vital Signs Assessment: post-procedure vital signs reviewed and stable Respiratory status: spontaneous breathing, nonlabored ventilation and respiratory function stable Cardiovascular status: blood pressure returned to baseline and stable Postop Assessment: no apparent nausea or vomiting Anesthetic complications: no    Last Vitals:  Vitals:   06/09/19 0845 06/09/19 0903  BP: 109/77 113/80  Pulse: 74 76  Resp: 14 18  Temp:  36.8 C  SpO2: 97% 99%    Last Pain:  Vitals:   06/09/19 0903  TempSrc: Oral  PainSc: 0-No pain                 Catalina Gravel

## 2019-06-09 NOTE — Anesthesia Procedure Notes (Signed)
Procedure Name: LMA Insertion Date/Time: 06/09/2019 7:31 AM Performed by: Signe Colt, CRNA Pre-anesthesia Checklist: Patient identified, Emergency Drugs available, Suction available and Patient being monitored Patient Re-evaluated:Patient Re-evaluated prior to induction Oxygen Delivery Method: Circle system utilized Preoxygenation: Pre-oxygenation with 100% oxygen Induction Type: IV induction Ventilation: Mask ventilation without difficulty LMA: LMA inserted LMA Size: 4.0 Number of attempts: 1 Airway Equipment and Method: Bite block Placement Confirmation: positive ETCO2 Tube secured with: Tape Dental Injury: Teeth and Oropharynx as per pre-operative assessment

## 2019-06-09 NOTE — Op Note (Signed)
Preoperative diagnosis:clinical stage II breast cancer need for chemotherapy Postoperative diagnosis: saa Procedure: Right ij port placement with US guidance Surgeon: Dr Serita Grammes EBL: minimal Anesthesia: general  Complications none Drains none Specimens:none Sponge and needle count correct times two dispo to recovery stable  Indications: 33yof who has at least clinical stage II breast cancer for primary chemotherapy. Discussed port placement  Procedure:After informed consent was obtained the patient was taken to the operating room. She was given antibiotics. SCDs were placed. She was placed under general anesthesia without complication. She was prepped and draped in the standard sterile surgical fashion. A surgical timeout was then performed.  I identified the internal jugular vein on the right side with the ultrasound. I made a small nick in the skin. I accessed the internal jugular vein with the needle under ultrasound guidance. I passed the wire. The wire was confirmed to be in position with fluoroscopy.The wire was in the vein by ultrasound as well.I then infiltrated Marcaine below the clavicle on the right side. I made an incision and developed a subcutaneous pocket for the port. I then tunneled between the port site as well as the insertion site. I brought the line through this. I then placed the dilator under fluoroscopic guidance over the wire. I removedthe wire andthe dilator. I then placed the line into the sheath. The sheath was then removed. I pulled the line back to be in the distal vena cava. I then hooked this up to the port. This was placed in the pocket and sutured in place with 2-0 Prolene suture. I then closed this with 3-0 Vicryl and 4-0 Monocryl. Glue was placed. I accessed this. It withdrew blood and flushed easily. I packed it with heparin.I left it accessed for chemotherapy tomorrow.

## 2019-06-10 ENCOUNTER — Other Ambulatory Visit: Payer: Self-pay

## 2019-06-10 ENCOUNTER — Inpatient Hospital Stay: Payer: No Typology Code available for payment source

## 2019-06-10 ENCOUNTER — Inpatient Hospital Stay: Payer: No Typology Code available for payment source | Attending: Hematology and Oncology

## 2019-06-10 ENCOUNTER — Encounter: Payer: Self-pay | Admitting: *Deleted

## 2019-06-10 ENCOUNTER — Encounter: Payer: Self-pay | Admitting: General Practice

## 2019-06-10 ENCOUNTER — Encounter (HOSPITAL_BASED_OUTPATIENT_CLINIC_OR_DEPARTMENT_OTHER): Payer: Self-pay | Admitting: General Surgery

## 2019-06-10 VITALS — BP 117/74 | HR 78 | Temp 98.3°F | Resp 17

## 2019-06-10 DIAGNOSIS — Z7689 Persons encountering health services in other specified circumstances: Secondary | ICD-10-CM | POA: Diagnosis not present

## 2019-06-10 DIAGNOSIS — Z79899 Other long term (current) drug therapy: Secondary | ICD-10-CM | POA: Insufficient documentation

## 2019-06-10 DIAGNOSIS — Z5111 Encounter for antineoplastic chemotherapy: Secondary | ICD-10-CM | POA: Diagnosis not present

## 2019-06-10 DIAGNOSIS — K5900 Constipation, unspecified: Secondary | ICD-10-CM | POA: Diagnosis not present

## 2019-06-10 DIAGNOSIS — C50311 Malignant neoplasm of lower-inner quadrant of right female breast: Secondary | ICD-10-CM | POA: Insufficient documentation

## 2019-06-10 DIAGNOSIS — R5383 Other fatigue: Secondary | ICD-10-CM | POA: Diagnosis not present

## 2019-06-10 DIAGNOSIS — D709 Neutropenia, unspecified: Secondary | ICD-10-CM | POA: Insufficient documentation

## 2019-06-10 DIAGNOSIS — Z170 Estrogen receptor positive status [ER+]: Secondary | ICD-10-CM | POA: Diagnosis not present

## 2019-06-10 DIAGNOSIS — R109 Unspecified abdominal pain: Secondary | ICD-10-CM | POA: Insufficient documentation

## 2019-06-10 LAB — PREGNANCY, URINE: Preg Test, Ur: NEGATIVE

## 2019-06-10 MED ORDER — DOXORUBICIN HCL CHEMO IV INJECTION 2 MG/ML
60.0000 mg/m2 | Freq: Once | INTRAVENOUS | Status: AC
  Administered 2019-06-10: 118 mg via INTRAVENOUS
  Filled 2019-06-10: qty 59

## 2019-06-10 MED ORDER — SODIUM CHLORIDE 0.9 % IV SOLN
Freq: Once | INTRAVENOUS | Status: AC
  Administered 2019-06-10: 09:00:00 via INTRAVENOUS
  Filled 2019-06-10: qty 5

## 2019-06-10 MED ORDER — PALONOSETRON HCL INJECTION 0.25 MG/5ML
0.2500 mg | Freq: Once | INTRAVENOUS | Status: AC
  Administered 2019-06-10: 0.25 mg via INTRAVENOUS

## 2019-06-10 MED ORDER — SODIUM CHLORIDE 0.9% FLUSH
10.0000 mL | INTRAVENOUS | Status: DC | PRN
  Administered 2019-06-10: 10 mL
  Filled 2019-06-10: qty 10

## 2019-06-10 MED ORDER — HEPARIN SOD (PORK) LOCK FLUSH 100 UNIT/ML IV SOLN
500.0000 [IU] | Freq: Once | INTRAVENOUS | Status: AC | PRN
  Administered 2019-06-10: 500 [IU]
  Filled 2019-06-10: qty 5

## 2019-06-10 MED ORDER — PALONOSETRON HCL INJECTION 0.25 MG/5ML
INTRAVENOUS | Status: AC
  Filled 2019-06-10: qty 5

## 2019-06-10 MED ORDER — SODIUM CHLORIDE 0.9 % IV SOLN
Freq: Once | INTRAVENOUS | Status: AC
  Administered 2019-06-10: 09:00:00 via INTRAVENOUS
  Filled 2019-06-10: qty 250

## 2019-06-10 MED ORDER — SODIUM CHLORIDE 0.9 % IV SOLN
600.0000 mg/m2 | Freq: Once | INTRAVENOUS | Status: AC
  Administered 2019-06-10: 1180 mg via INTRAVENOUS
  Filled 2019-06-10: qty 59

## 2019-06-10 NOTE — Patient Instructions (Signed)
Cancer Center Discharge Instructions for Patients Receiving Chemotherapy  Today you received the following chemotherapy agents: Adriamycin, Cytoxan  To help prevent nausea and vomiting after your treatment, we encourage you to take your nausea medication as directed.   If you develop nausea and vomiting that is not controlled by your nausea medication, call the clinic.   BELOW ARE SYMPTOMS THAT SHOULD BE REPORTED IMMEDIATELY:  *FEVER GREATER THAN 100.5 F  *CHILLS WITH OR WITHOUT FEVER  NAUSEA AND VOMITING THAT IS NOT CONTROLLED WITH YOUR NAUSEA MEDICATION  *UNUSUAL SHORTNESS OF BREATH  *UNUSUAL BRUISING OR BLEEDING  TENDERNESS IN MOUTH AND THROAT WITH OR WITHOUT PRESENCE OF ULCERS  *URINARY PROBLEMS  *BOWEL PROBLEMS  UNUSUAL RASH Items with * indicate a potential emergency and should be followed up as soon as possible.  Feel free to call the clinic should you have any questions or concerns. The clinic phone number is [phone].  Please show the CHEMO ALERT CARD at check-in to the Emergency Department and triage nurse.  Doxorubicin injection What is this medicine? DOXORUBICIN (dox oh ROO bi sin) is a chemotherapy drug. It is used to treat many kinds of cancer like leukemia, lymphoma, neuroblastoma, sarcoma, and Wilms' tumor. It is also used to treat bladder cancer, breast cancer, lung cancer, ovarian cancer, stomach cancer, and thyroid cancer. This medicine may be used for other purposes; ask your health care provider or pharmacist if you have questions. COMMON BRAND NAME(S): Adriamycin, Adriamycin PFS, Adriamycin RDF, Rubex What should I tell my health care provider before I take this medicine? They need to know if you have any of these conditions:  heart disease  history of low blood counts caused by a medicine  liver disease  recent or ongoing radiation therapy  an unusual or allergic reaction to doxorubicin, other chemotherapy agents, other  medicines, foods, dyes, or preservatives  pregnant or trying to get pregnant  breast-feeding How should I use this medicine? This drug is given as an infusion into a vein. It is administered in a hospital or clinic by a specially trained health care professional. If you have pain, swelling, burning or any unusual feeling around the site of your injection, tell your health care professional right away. Talk to your pediatrician regarding the use of this medicine in children. Special care may be needed. Overdosage: If you think you have taken too much of this medicine contact a poison control center or emergency room at once. NOTE: This medicine is only for you. Do not share this medicine with others. What if I miss a dose? It is important not to miss your dose. Call your doctor or health care professional if you are unable to keep an appointment. What may interact with this medicine? This medicine may interact with the following medications:  6-mercaptopurine  paclitaxel  phenytoin  St. John's Wort  trastuzumab  verapamil This list may not describe all possible interactions. Give your health care provider a list of all the medicines, herbs, non-prescription drugs, or dietary supplements you use. Also tell them if you smoke, drink alcohol, or use illegal drugs. Some items may interact with your medicine. What should I watch for while using this medicine? This drug may make you feel generally unwell. This is not uncommon, as chemotherapy can affect healthy cells as well as cancer cells. Report any side effects. Continue your course of treatment even though you feel ill unless your doctor tells you to stop. There is a maximum amount of   this medicine you should receive throughout your life. The amount depends on the medical condition being treated and your overall health. Your doctor will watch how much of this medicine you receive in your lifetime. Tell your doctor if you have taken this  medicine before. You may need blood work done while you are taking this medicine. Your urine may turn red for a few days after your dose. This is not blood. If your urine is dark or brown, call your doctor. In some cases, you may be given additional medicines to help with side effects. Follow all directions for their use. Call your doctor or health care professional for advice if you get a fever, chills or sore throat, or other symptoms of a cold or flu. Do not treat yourself. This drug decreases your body's ability to fight infections. Try to avoid being around people who are sick. This medicine may increase your risk to bruise or bleed. Call your doctor or health care professional if you notice any unusual bleeding. Talk to your doctor about your risk of cancer. You may be more at risk for certain types of cancers if you take this medicine. Do not become pregnant while taking this medicine or for 6 months after stopping it. Women should inform their doctor if they wish to become pregnant or think they might be pregnant. Men should not father a child while taking this medicine and for 6 months after stopping it. There is a potential for serious side effects to an unborn child. Talk to your health care professional or pharmacist for more information. Do not breast-feed an infant while taking this medicine. This medicine has caused ovarian failure in some women and reduced sperm counts in some men This medicine may interfere with the ability to have a child. Talk with your doctor or health care professional if you are concerned about your fertility. This medicine may cause a decrease in Co-Enzyme Q-10. You should make sure that you get enough Co-Enzyme Q-10 while you are taking this medicine. Discuss the foods you eat and the vitamins you take with your health care professional. What side effects may I notice from receiving this medicine? Side effects that you should report to your doctor or health care  professional as soon as possible:  allergic reactions like skin rash, itching or hives, swelling of the face, lips, or tongue  breathing problems  chest pain  fast or irregular heartbeat  low blood counts - this medicine may decrease the number of white blood cells, red blood cells and platelets. You may be at increased risk for infections and bleeding.  pain, redness, or irritation at site where injected  signs of infection - fever or chills, cough, sore throat, pain or difficulty passing urine  signs of decreased platelets or bleeding - bruising, pinpoint red spots on the skin, black, tarry stools, blood in the urine  swelling of the ankles, feet, hands  tiredness  weakness Side effects that usually do not require medical attention (report to your doctor or health care professional if they continue or are bothersome):  diarrhea  hair loss  mouth sores  nail discoloration or damage  nausea  red colored urine  vomiting This list may not describe all possible side effects. Call your doctor for medical advice about side effects. You may report side effects to FDA at 1-800-FDA-1088. Where should I keep my medicine? This drug is given in a hospital or clinic and will not be stored at home. NOTE:   This sheet is a summary. It may not cover all possible information. If you have questions about this medicine, talk to your doctor, pharmacist, or health care provider.  2020 Elsevier/Gold Standard (2017-02-06 11:01:26)   Cyclophosphamide injection What is this medicine? CYCLOPHOSPHAMIDE (sye kloe FOSS fa mide) is a chemotherapy drug. It slows the growth of cancer cells. This medicine is used to treat many types of cancer like lymphoma, myeloma, leukemia, breast cancer, and ovarian cancer, to name a few. This medicine may be used for other purposes; ask your health care provider or pharmacist if you have questions. COMMON BRAND NAME(S): Cytoxan, Neosar What should I tell my  health care provider before I take this medicine? They need to know if you have any of these conditions:  blood disorders  history of other chemotherapy  infection  kidney disease  liver disease  recent or ongoing radiation therapy  tumors in the bone marrow  an unusual or allergic reaction to cyclophosphamide, other chemotherapy, other medicines, foods, dyes, or preservatives  pregnant or trying to get pregnant  breast-feeding How should I use this medicine? This drug is usually given as an injection into a vein or muscle or by infusion into a vein. It is administered in a hospital or clinic by a specially trained health care professional. Talk to your pediatrician regarding the use of this medicine in children. Special care may be needed. Overdosage: If you think you have taken too much of this medicine contact a poison control center or emergency room at once. NOTE: This medicine is only for you. Do not share this medicine with others. What if I miss a dose? It is important not to miss your dose. Call your doctor or health care professional if you are unable to keep an appointment. What may interact with this medicine? This medicine may interact with the following medications:  amiodarone  amphotericin B  azathioprine  certain antiviral medicines for HIV or AIDS such as protease inhibitors (e.g., indinavir, ritonavir) and zidovudine  certain blood pressure medications such as benazepril, captopril, enalapril, fosinopril, lisinopril, moexipril, monopril, perindopril, quinapril, ramipril, trandolapril  certain cancer medications such as anthracyclines (e.g., daunorubicin, doxorubicin), busulfan, cytarabine, paclitaxel, pentostatin, tamoxifen, trastuzumab  certain diuretics such as chlorothiazide, chlorthalidone, hydrochlorothiazide, indapamide, metolazone  certain medicines that treat or prevent blood clots like warfarin  certain muscle relaxants such as  succinylcholine  cyclosporine  etanercept  indomethacin  medicines to increase blood counts like filgrastim, pegfilgrastim, sargramostim  medicines used as general anesthesia  metronidazole  natalizumab This list may not describe all possible interactions. Give your health care provider a list of all the medicines, herbs, non-prescription drugs, or dietary supplements you use. Also tell them if you smoke, drink alcohol, or use illegal drugs. Some items may interact with your medicine. What should I watch for while using this medicine? Visit your doctor for checks on your progress. This drug may make you feel generally unwell. This is not uncommon, as chemotherapy can affect healthy cells as well as cancer cells. Report any side effects. Continue your course of treatment even though you feel ill unless your doctor tells you to stop. Drink water or other fluids as directed. Urinate often, even at night. In some cases, you may be given additional medicines to help with side effects. Follow all directions for their use. Call your doctor or health care professional for advice if you get a fever, chills or sore throat, or other symptoms of a cold or flu. Do not   treat yourself. This drug decreases your body's ability to fight infections. Try to avoid being around people who are sick. This medicine may increase your risk to bruise or bleed. Call your doctor or health care professional if you notice any unusual bleeding. Be careful brushing and flossing your teeth or using a toothpick because you may get an infection or bleed more easily. If you have any dental work done, tell your dentist you are receiving this medicine. You may get drowsy or dizzy. Do not drive, use machinery, or do anything that needs mental alertness until you know how this medicine affects you. Do not become pregnant while taking this medicine or for 1 year after stopping it. Women should inform their doctor if they wish to  become pregnant or think they might be pregnant. Men should not father a child while taking this medicine and for 4 months after stopping it. There is a potential for serious side effects to an unborn child. Talk to your health care professional or pharmacist for more information. Do not breast-feed an infant while taking this medicine. This medicine may interfere with the ability to have a child. This medicine has caused ovarian failure in some women. This medicine has caused reduced sperm counts in some men. You should talk with your doctor or health care professional if you are concerned about your fertility. If you are going to have surgery, tell your doctor or health care professional that you have taken this medicine. What side effects may I notice from receiving this medicine? Side effects that you should report to your doctor or health care professional as soon as possible:  allergic reactions like skin rash, itching or hives, swelling of the face, lips, or tongue  low blood counts - this medicine may decrease the number of white blood cells, red blood cells and platelets. You may be at increased risk for infections and bleeding.  signs of infection - fever or chills, cough, sore throat, pain or difficulty passing urine  signs of decreased platelets or bleeding - bruising, pinpoint red spots on the skin, black, tarry stools, blood in the urine  signs of decreased red blood cells - unusually weak or tired, fainting spells, lightheadedness  breathing problems  dark urine  dizziness  palpitations  swelling of the ankles, feet, hands  trouble passing urine or change in the amount of urine  weight gain  yellowing of the eyes or skin Side effects that usually do not require medical attention (report to your doctor or health care professional if they continue or are bothersome):  changes in nail or skin color  hair loss  missed menstrual periods  mouth sores  nausea,  vomiting This list may not describe all possible side effects. Call your doctor for medical advice about side effects. You may report side effects to FDA at 1-800-FDA-1088. Where should I keep my medicine? This drug is given in a hospital or clinic and will not be stored at home. NOTE: This sheet is a summary. It may not cover all possible information. If you have questions about this medicine, talk to your doctor, pharmacist, or health care provider.  2020 Elsevier/Gold Standard (2012-05-09 16:22:58)  

## 2019-06-10 NOTE — Progress Notes (Signed)
Pt reports burning sensation in sinus and eye areas. Cytoxan infusion rate decreased to 550 ml/hr. Patient reports improvement of symptoms at this time.

## 2019-06-10 NOTE — Progress Notes (Signed)
[facility] Note  Followed up with Kimberly Mueller at first infusion as planned, bringing bone pillow and prayer shawl as tangible signs of encouragement and care. She was upbeat and verbalized gratitude for the many layers of meaningful support she is receiving from family and friends, from a head-shaving ritual with her family (by her sister, who is a Theme park manager) to a meal train for the next couple of months. We talked about how remembering how she feels right now might help her cope with the temporariness of any side effects she feels in the coming days--because today's feelings are hopefully ones that she will return to. Kimberly Mueller plans to contact chaplain for a check-in as needed/desired, but please also page if needs arise or circumstances change. Thank you.   [place], [place], [facility] Pager [phone] Voicemail [phone]

## 2019-06-11 ENCOUNTER — Other Ambulatory Visit: Payer: 59

## 2019-06-11 ENCOUNTER — Ambulatory Visit: Payer: 59 | Admitting: Hematology and Oncology

## 2019-06-11 ENCOUNTER — Ambulatory Visit: Payer: 59

## 2019-06-11 ENCOUNTER — Encounter: Payer: Self-pay | Admitting: Hematology and Oncology

## 2019-06-11 NOTE — Progress Notes (Signed)
Called pt to introduce myself as her Financial Resource Specialist and to discuss copay assistance.  Pt has met her deductible for the year and her plan doesn't renew until June 2021 so copay assistance shouldn't be needed at this time.  I offered the [employer] grant, went over what it covers and gave her the income requirement, pt stated she exceeds the requirement so she doesn't qualify for the grant at this time.  I requested for the registration staff to give her my card at her next visit for any questions or concerns she may have in the future. °

## 2019-06-12 ENCOUNTER — Other Ambulatory Visit: Payer: Self-pay

## 2019-06-12 ENCOUNTER — Telehealth: Payer: Self-pay | Admitting: Emergency Medicine

## 2019-06-12 ENCOUNTER — Ambulatory Visit (HOSPITAL_COMMUNITY)
Admission: RE | Admit: 2019-06-12 | Discharge: 2019-06-12 | Disposition: A | Discharge status: Home / Self Care | Payer: No Typology Code available for payment source | Source: Ambulatory Visit | Attending: Hematology and Oncology | Admitting: Hematology and Oncology

## 2019-06-12 ENCOUNTER — Inpatient Hospital Stay: Payer: No Typology Code available for payment source

## 2019-06-12 ENCOUNTER — Encounter (HOSPITAL_COMMUNITY)
Admission: RE | Admit: 2019-06-12 | Discharge: 2019-06-12 | Disposition: A | Discharge status: Home / Self Care | Payer: No Typology Code available for payment source | Source: Ambulatory Visit | Attending: Hematology and Oncology | Admitting: Hematology and Oncology

## 2019-06-12 VITALS — BP 114/72 | HR 60 | Temp 98.7°F | Resp 18

## 2019-06-12 DIAGNOSIS — Z170 Estrogen receptor positive status [ER+]: Secondary | ICD-10-CM | POA: Diagnosis present

## 2019-06-12 DIAGNOSIS — C50311 Malignant neoplasm of lower-inner quadrant of right female breast: Secondary | ICD-10-CM | POA: Insufficient documentation

## 2019-06-12 MED ORDER — PEGFILGRASTIM-CBQV 6 MG/0.6ML ~~LOC~~ SOSY
PREFILLED_SYRINGE | SUBCUTANEOUS | Status: AC
  Filled 2019-06-12: qty 0.6

## 2019-06-12 MED ORDER — PEGFILGRASTIM-CBQV 6 MG/0.6ML ~~LOC~~ SOSY
6.0000 mg | PREFILLED_SYRINGE | Freq: Once | SUBCUTANEOUS | Status: AC
  Administered 2019-06-12: 6 mg via SUBCUTANEOUS

## 2019-06-12 MED ORDER — TECHNETIUM TC 99M MEDRONATE IV KIT
20.9000 | PACK | Freq: Once | INTRAVENOUS | Status: AC | PRN
  Administered 2019-06-12: 12:00:00 20.9 via INTRAVENOUS

## 2019-06-12 NOTE — Telephone Encounter (Signed)
First time chemo f/u call.  Pt denies any questions or concerns at this time, only mild fatigue.  Pt verbalized understanding to f/u as needed.

## 2019-06-12 NOTE — Patient Instructions (Signed)

## 2019-06-12 NOTE — Telephone Encounter (Signed)
-----   Message from Lillia Corporal, RN sent at 06/10/2019 10:46 AM EST ----- Regarding: Dr. Lindi Adie- first time chemo f/u First time [facility]

## 2019-06-15 ENCOUNTER — Encounter: Payer: Self-pay | Admitting: Licensed Clinical Social Worker

## 2019-06-15 ENCOUNTER — Other Ambulatory Visit: Payer: Self-pay

## 2019-06-15 ENCOUNTER — Telehealth: Payer: Self-pay | Admitting: Licensed Clinical Social Worker

## 2019-06-15 ENCOUNTER — Ambulatory Visit (HOSPITAL_COMMUNITY)
Admission: RE | Admit: 2019-06-15 | Discharge: 2019-06-15 | Disposition: A | Discharge status: Home / Self Care | Payer: 59 | Source: Ambulatory Visit | Attending: Hematology and Oncology | Admitting: Hematology and Oncology

## 2019-06-15 DIAGNOSIS — Z170 Estrogen receptor positive status [ER+]: Secondary | ICD-10-CM | POA: Insufficient documentation

## 2019-06-15 DIAGNOSIS — C50311 Malignant neoplasm of lower-inner quadrant of right female breast: Secondary | ICD-10-CM

## 2019-06-15 DIAGNOSIS — Z1379 Encounter for other screening for genetic and chromosomal anomalies: Secondary | ICD-10-CM | POA: Insufficient documentation

## 2019-06-15 MED ORDER — GADOBUTROL 1 MMOL/ML IV SOLN
8.0000 mL | Freq: Once | INTRAVENOUS | Status: AC | PRN
  Administered 2019-06-15: 8 mL via INTRAVENOUS

## 2019-06-15 NOTE — Telephone Encounter (Signed)
Revealed negative genetic testing.  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  We discussed that we do not know why she has breast cancer or why there is cancer in the family. It could be due to a different gene that we are not testing, or something our current technology cannot pick up.  It will be important for her to keep in contact with genetics to learn if additional testing may be needed in the future.  

## 2019-06-16 ENCOUNTER — Ambulatory Visit: Payer: Self-pay | Admitting: Licensed Clinical Social Worker

## 2019-06-16 ENCOUNTER — Ambulatory Visit
Admission: RE | Admit: 2019-06-16 | Discharge: 2019-06-16 | Disposition: A | Discharge status: Home / Self Care | Payer: 59 | Source: Ambulatory Visit | Attending: General Surgery | Admitting: General Surgery

## 2019-06-16 ENCOUNTER — Telehealth: Payer: Self-pay | Admitting: *Deleted

## 2019-06-16 ENCOUNTER — Other Ambulatory Visit: Payer: Self-pay | Admitting: General Surgery

## 2019-06-16 ENCOUNTER — Other Ambulatory Visit: Payer: Self-pay | Admitting: *Deleted

## 2019-06-16 DIAGNOSIS — C50311 Malignant neoplasm of lower-inner quadrant of right female breast: Secondary | ICD-10-CM

## 2019-06-16 DIAGNOSIS — Z800 Family history of malignant neoplasm of digestive organs: Secondary | ICD-10-CM

## 2019-06-16 DIAGNOSIS — Z170 Estrogen receptor positive status [ER+]: Secondary | ICD-10-CM

## 2019-06-16 DIAGNOSIS — Z8042 Family history of malignant neoplasm of prostate: Secondary | ICD-10-CM

## 2019-06-16 DIAGNOSIS — Z1379 Encounter for other screening for genetic and chromosomal anomalies: Secondary | ICD-10-CM

## 2019-06-16 NOTE — Telephone Encounter (Signed)
Called pt and discuss bone scan and liver MRI. Discussed recommendations for left femur xrt and possible liver bx. Discussed US/bx for later this afternoon and reasoning. Denies further needs or questions at this time.

## 2019-06-16 NOTE — Progress Notes (Signed)
HPI:  Kimberly Mueller was previously seen in the [place] clinic due to a personal and family history of cancer and concerns regarding a hereditary predisposition to cancer. Please refer to our prior cancer genetics clinic note for more information regarding our discussion, assessment and recommendations, at the time. Kimberly Mueller recent genetic test results were disclosed to her, as were recommendations warranted by these results. These results and recommendations are discussed in more detail below.  CANCER HISTORY:  Oncology History  Malignant neoplasm of lower-inner quadrant of right breast of female, estrogen receptor positive ([place])  05/25/2019 Initial Diagnosis   Patient palpated a right breast lump for two years. Diagnostic mammogram showed a 4.5cm right breast mass at the 6 o'clock position involving the overlying skin, 2 benign appearing left breast mass at the 10 o'clock position measuring 2.4cm and 2.1cm, and no axillary adenopathy. Biopsy showed: in the left breast, fibroadenoma and no evidence of malignancy, and in the right breast, IDC with DCIS, grade 3.  ER 70%, PR 30%, Ki-67 40%, HER-2 negative by [facility]    06/10/2019 -  Chemotherapy   The patient had DOXOrubicin (ADRIAMYCIN) chemo injection 118 mg, 60 mg/m2 = 118 mg, Intravenous,  Once, 1 of 4 cycles Administration: 118 mg (06/10/2019) palonosetron (ALOXI) injection 0.25 mg, 0.25 mg, Intravenous,  Once, 1 of 4 cycles Administration: 0.25 mg (06/10/2019) pegfilgrastim-cbqv (UDENYCA) injection 6 mg, 6 mg, Subcutaneous, Once, 1 of 4 cycles Administration: 6 mg (06/12/2019) cyclophosphamide (CYTOXAN) 1,180 mg in sodium chloride 0.9 % 250 mL chemo infusion, 600 mg/m2 = 1,180 mg, Intravenous,  Once, 1 of 4 cycles Administration: 1,180 mg (06/10/2019) PACLitaxel (TAXOL) 156 mg in sodium chloride 0.9 % 250 mL chemo infusion (</= 36m/m2), 80 mg/m2 = 156 mg, Intravenous,  Once, 0 of 12 cycles fosaprepitant (EMEND) 150 mg,  dexamethasone (DECADRON) 12 mg in sodium chloride 0.9 % 145 mL IVPB, , Intravenous,  Once, 1 of 4 cycles Administration:  (06/10/2019)  for chemotherapy treatment.     Genetic Testing   No pathogenic variants identified. VUS in [place] c.2378A>G identified on the [facility] Common Hereditary Cancers Panel. The report date is 06/15/2019.  The Common Hereditary Cancers Panel offered by [facility] includes sequencing and/or deletion duplication testing of the following 48 genes: APC, ATM, AXIN2, BARD1, BMPR1A, BRCA1, BRCA2, BRIP1, CDH1, CDKN2A (p14ARF), CDKN2A (p16INK4a), CKD4, CHEK2, CTNNA1, DICER1, EPCAM (Deletion/duplication testing only), GREM1 (promoter region deletion/duplication testing only), KIT, MEN1, MLH1, MSH2, MSH3, MSH6, MUTYH, NBN, NF1, NHTL1, PALB2, PDGFRA, PMS2, POLD1, POLE, PTEN, RAD50, RAD51C, RAD51D, RNF43, SDHB, SDHC, SDHD, SMAD4, SMARCA4. STK11, TP53, TSC1, TSC2, and VHL.  The following genes were evaluated for sequence changes only: SDHA and HOXB13 c.251G>A variant only.     FAMILY HISTORY:  We obtained a detailed, 4-generation family history.  Significant diagnoses are listed below: Family History  Problem Relation Age of Onset   Prostate cancer Father    Pancreatic cancer Paternal Grandmother    Ms. PTholedoes not have children. She has 1 brother, 368 and 1 sister, 345 and 5 nieces/nephews.  Ms. PMoesermother is living at 535with no history of cancer. Patient has 1 maternal uncle, no cancers. No known cancers in maternal cousins. Maternal grandmother had skin cancer on her chest, she is living in her 734s Maternal grandfather passed in his 681s His father, patient's great grandfather, had pancreatic cancer.   Ms. PBrassfather is living at 650and had prostate cancer at 584 Patient has 1 paternal aunt,  1 paternal uncle, no cancers. No known cancers in paternal cousins. Paternal grandmother is living in her 34s, paternal grandfather passed due to heart issues, no  history of cancer.  Ms. Toya is unaware of previous family history of genetic testing for hereditary cancer risks. Patient's maternal ancestors are of European descent, and paternal ancestors are of European descent. There is no reported Ashkenazi Jewish ancestry. There is no known consanguinity.  GENETIC TEST RESULTS: Genetic testing reported out on 06/15/2019 through the [facility] Common Hereditary cancer panel found no pathogenic mutations.  The Common Hereditary Cancers Panel offered by [facility] includes sequencing and/or deletion duplication testing of the following 48 genes: APC, ATM, AXIN2, BARD1, BMPR1A, BRCA1, BRCA2, BRIP1, CDH1, CDKN2A (p14ARF), CDKN2A (p16INK4a), CKD4, CHEK2, CTNNA1, DICER1, EPCAM (Deletion/duplication testing only), GREM1 (promoter region deletion/duplication testing only), KIT, MEN1, MLH1, MSH2, MSH3, MSH6, MUTYH, NBN, NF1, NHTL1, PALB2, PDGFRA, PMS2, POLD1, POLE, PTEN, RAD50, RAD51C, RAD51D, RNF43, SDHB, SDHC, SDHD, SMAD4, SMARCA4. STK11, TP53, TSC1, TSC2, and VHL.  The following genes were evaluated for sequence changes only: SDHA and HOXB13 c.251G>A variant only.  The test report has been scanned into EPIC and is located under the Molecular Pathology section of the Results Review tab.  A portion of the result report is included below for reference.      We discussed with Kimberly Mueller that because current genetic testing is not perfect, it is possible there may be a gene mutation in one of these genes that current testing cannot detect, but that chance is small.  We also discussed, that there could be another gene that has not yet been discovered, or that we have not yet tested, that is responsible for the cancer diagnoses in the family. It is also possible there is a hereditary cause for the cancer in the family that Kimberly Mueller did not inherit and therefore was not identified in her testing.  Therefore, it is important to remain in touch with cancer genetics in the future  so that we can continue to offer Kimberly Mueller the most up to date genetic testing.   Genetic testing did identify a variant of uncertain significance (VUS) was identified in the DICER1 gene.  At this time, it is unknown if this variant is associated with increased cancer risk or if this is a normal finding, but most variants such as this get reclassified to being inconsequential. It should not be used to make medical management decisions. With time, we suspect the lab will determine the significance of this variant, if any. If we do learn more about it, we will try to contact Kimberly Mueller to discuss it further. However, it is important to stay in touch with [place] periodically and keep the address and phone number up to date.   ADDITIONAL GENETIC TESTING: We discussed with Kimberly Mueller that her genetic testing was fairly extensive.  If there are genes identified to increase cancer risk that can be analyzed in the future, we would be happy to discuss and coordinate this testing at that time.    CANCER SCREENING RECOMMENDATIONS: Kimberly Mueller test result is considered negative (normal).  This means that we have not identified a hereditary cause for her  personal and family history of cancer at this time.   While reassuring, this does not definitively rule out a hereditary predisposition to cancer. It is still possible that there could be genetic mutations that are undetectable by current technology. There could be genetic mutations in genes that have not been tested  or identified to increase cancer risk.  Therefore, it is recommended she continue to follow the cancer management and screening guidelines provided by her oncology and primary healthcare provider.   An individual's cancer risk and medical management are not determined by genetic test results alone. Overall cancer risk assessment incorporates additional factors, including personal medical history, family history, and any available genetic information that  may result in a personalized plan for cancer prevention and surveillance.  RECOMMENDATIONS FOR FAMILY MEMBERS:  Relatives in this family might be at some increased risk of developing cancer, over the general population risk, simply due to the family history of cancer.  We recommended female relatives in this family have a yearly mammogram beginning at age 34, or 4 years younger than the earliest onset of cancer, an annual clinical breast exam, and perform monthly breast self-exams. Female relatives in this family should also have a gynecological exam as recommended by their primary provider. All family members should have a colonoscopy by age 7, or as directed by their physicians.  It is also possible there is a hereditary cause for the cancer in Kimberly Mueller family that she did not inherit and therefore was not identified in her.  Based on Kimberly Mueller family history, we recommended those related to her great grandfather with pancreatic cancer  have genetic counseling and testing. Kimberly Mueller will let us know if we can be of any assistance in coordinating genetic counseling and/or testing for these family members.  FOLLOW-UP: Lastly, we discussed with Kimberly Mueller that cancer genetics is a rapidly advancing field and it is possible that new genetic tests will be appropriate for her and/or her family members in the future. We encouraged her to remain in contact with cancer genetics on an annual basis so we can update her personal and family histories and let her know of advances in cancer genetics that may benefit this family.   Our contact number was provided. Kimberly Mueller questions were answered to her satisfaction, and she knows she is welcome to call us at anytime with additional questions or concerns.   Kimberly Rogue, MS, [facility] Genetic Counselor [email or web address] Phone: [phone]

## 2019-06-16 NOTE — Progress Notes (Signed)
Patient Care Team: Jilda Panda, MD as PCP - General (Internal Medicine) Nicholas Lose, MD as Consulting Physician (Hematology and Oncology) Rolm Bookbinder, MD as Consulting Physician (General Surgery) Gery Pray, MD as Consulting Physician (Radiation Oncology) Rockwell Germany, RN as Oncology Nurse Navigator Mauro Kaufmann, RN as Oncology Nurse Navigator  DIAGNOSIS:    ICD-10-CM   1. Malignant neoplasm of lower-inner quadrant of right breast of female, estrogen receptor positive ([place])  C50.311    Z17.0     SUMMARY OF ONCOLOGIC HISTORY: Oncology History  Malignant neoplasm of lower-inner quadrant of right breast of female, estrogen receptor positive ([place])  05/25/2019 Initial Diagnosis   Patient palpated a right breast lump for two years. Diagnostic mammogram showed a 4.5cm right breast mass at the 6 o'clock position involving the overlying skin, 2 benign appearing left breast mass at the 10 o'clock position measuring 2.4cm and 2.1cm, and no axillary adenopathy. Biopsy showed: in the left breast, fibroadenoma and no evidence of malignancy, and in the right breast, IDC with DCIS, grade 3.  ER 70%, PR 30%, Ki-67 40%, HER-2 negative by [facility]    06/10/2019 -  Chemotherapy   The patient had DOXOrubicin (ADRIAMYCIN) chemo injection 118 mg, 60 mg/m2 = 118 mg, Intravenous,  Once, 1 of 4 cycles Dose modification: 50 mg/m2 (original dose 60 mg/m2, Cycle 2, Reason: Dose not tolerated) Administration: 118 mg (06/10/2019) palonosetron (ALOXI) injection 0.25 mg, 0.25 mg, Intravenous,  Once, 1 of 4 cycles Administration: 0.25 mg (06/10/2019) pegfilgrastim-cbqv (UDENYCA) injection 6 mg, 6 mg, Subcutaneous, Once, 1 of 4 cycles Administration: 6 mg (06/12/2019) cyclophosphamide (CYTOXAN) 1,180 mg in sodium chloride 0.9 % 250 mL chemo infusion, 600 mg/m2 = 1,180 mg, Intravenous,  Once, 1 of 4 cycles Dose modification: 500 mg/m2 (original dose 600 mg/m2, Cycle 2, Reason: Dose not tolerated)  Administration: 1,180 mg (06/10/2019) PACLitaxel (TAXOL) 156 mg in sodium chloride 0.9 % 250 mL chemo infusion (</= 60m/m2), 80 mg/m2 = 156 mg, Intravenous,  Once, 0 of 12 cycles fosaprepitant (EMEND) 150 mg, dexamethasone (DECADRON) 12 mg in sodium chloride 0.9 % 145 mL IVPB, , Intravenous,  Once, 1 of 4 cycles Administration:  (06/10/2019)  for chemotherapy treatment.     Genetic Testing   No pathogenic variants identified. VUS [place] c.2378A>G identified on the [facility] Common Hereditary Cancers Panel. The report date is 06/15/2019.  The Common Hereditary Cancers Panel offered by [facility] includes sequencing and/or deletion duplication testing of the following 48 genes: APC, ATM, AXIN2, BARD1, BMPR1A, BRCA1, BRCA2, BRIP1, CDH1, CDKN2A (p14ARF), CDKN2A (p16INK4a), CKD4, CHEK2, CTNNA1, DICER1, EPCAM (Deletion/duplication testing only), GREM1 (promoter region deletion/duplication testing only), KIT, MEN1, MLH1, MSH2, MSH3, MSH6, MUTYH, NBN, NF1, NHTL1, PALB2, PDGFRA, PMS2, POLD1, POLE, PTEN, RAD50, RAD51C, RAD51D, RNF43, SDHB, SDHC, SDHD, SMAD4, SMARCA4. STK11, TP53, TSC1, TSC2, and VHL.  The following genes were evaluated for sequence changes only: SDHA and HOXB13 c.251G>A variant only.     CHIEF COMPLIANT: Cycle 1 Day 8 Adriamycin and Cytoxan  INTERVAL HISTORY: Kimberly Kruseris a 33y.o. with above-mentioned history of right breast cancer currently on neoadjuvant chemotherapy with dose dense Adriamycin and Cytoxan. Her port was placed by Dr. WDonne Hazelon 06/09/19. Bone scan on 06/12/19 showed one focus of abnormal tracer accumulation in the left femur for which metastatic disease could not be excluded. Liver MRI on 06/15/19 showed a 3.7cm mass favoring a benign hepatic adenoma, although metastasis could not be excluded. She presents to the clinic today for a toxicity  check following cycle 1.  She tolerated cycle 1 chemo extremely well.  She had a couple of days of fatigue but she recovered  very well from it.  She had a biopsy in the breast yesterday which she was told that it was negative for cancer.  She is complaining of soreness in the right breast around the Steri-Strips.  REVIEW OF SYSTEMS:   Constitutional: Denies fevers, chills or abnormal weight loss Eyes: Denies blurriness of vision Ears, nose, mouth, throat, and face: Denies mucositis or sore throat Respiratory: Denies cough, dyspnea or wheezes Cardiovascular: Denies palpitation, chest discomfort Gastrointestinal: Denies nausea, heartburn or change in bowel habits Skin: Denies abnormal skin rashes Lymphatics: Denies new lymphadenopathy or easy bruising Neurological: Denies numbness, tingling or new weaknesses Behavioral/Psych: Mood is stable, no new changes  Extremities: No lower extremity edema Breast: Right breast soreness All other systems were reviewed with the patient and are negative.  I have reviewed the past medical history, past surgical history, social history and family history with the patient and they are unchanged from previous note.  ALLERGIES:  has No Known Allergies.  MEDICATIONS:  Current Outpatient Medications  Medication Sig Dispense Refill  . Cyanocobalamin (B-12 COMPLIANCE INJECTION) 1000 MCG/ML KIT Inject as directed.    Marland Kitchen dexamethasone (DECADRON) 4 MG tablet Take 1 tablet (4 mg total) by mouth daily. Take 1 tablet day after chemo and 1 tablet 2 days after chemo with food 8 tablet 0  . etonogestrel (NEXPLANON) 68 MG IMPL implant 1 each by Subdermal route once.    . lidocaine-prilocaine (EMLA) cream Apply to affected area once 30 g 3  . LORazepam (ATIVAN) 0.5 MG tablet Take 1 tablet (0.5 mg total) by mouth every 6 (six) hours as needed (Nausea or vomiting). 30 tablet 0  . Multiple Vitamin (MULTIVITAMIN) tablet Take 1 tablet by mouth daily.    . ondansetron (ZOFRAN) 8 MG tablet Take 1 tablet (8 mg total) by mouth 2 (two) times daily as needed. Start on the third day after chemotherapy. 30  tablet 1  . oxyCODONE (OXY IR/ROXICODONE) 5 MG immediate release tablet Take 1 tablet (5 mg total) by mouth every 6 (six) hours as needed for moderate pain, severe pain or breakthrough pain. 8 tablet 0  . prochlorperazine (COMPAZINE) 10 MG tablet Take 1 tablet (10 mg total) by mouth every 6 (six) hours as needed (Nausea or vomiting). 30 tablet 1  . vortioxetine HBr (TRINTELLIX) 5 MG TABS tablet Take 5 mg by mouth daily.     No current facility-administered medications for this visit.     PHYSICAL EXAMINATION: ECOG PERFORMANCE STATUS: 1 - Symptomatic but completely ambulatory  Vitals:   06/17/19 1416  BP: 115/79  Pulse: 95  Resp: 18  Temp: 97.7 F (36.5 C)  SpO2: 100%   Filed Weights   06/17/19 1416  Weight: 179 lb (81.2 kg)    GENERAL: alert, no distress and comfortable SKIN: skin color, texture, turgor are normal, no rashes or significant lesions EYES: normal, Conjunctiva are pink and non-injected, sclera clear OROPHARYNX: no exudate, no erythema and lips, buccal mucosa, and tongue normal  NECK: supple, thyroid normal size, non-tender, without nodularity LYMPH: no palpable lymphadenopathy in the cervical, axillary or inguinal LUNGS: clear to auscultation and percussion with normal breathing effort HEART: regular rate & rhythm and no murmurs and no lower extremity edema ABDOMEN: abdomen soft, non-tender and normal bowel sounds MUSCULOSKELETAL: no cyanosis of digits and no clubbing  NEURO: alert & oriented x 3  with fluent speech, no focal motor/sensory deficits EXTREMITIES: No lower extremity edema  LABORATORY DATA:  I have reviewed the data as listed CMP Latest Ref Rng & Units 06/17/2019 06/08/2019 05/27/2019  Glucose 70 - 99 mg/dL 95 91 102(H)  BUN 6 - 20 mg/dL 7 5(L) 9  Creatinine 0.44 - 1.00 mg/dL 0.64 0.71 0.77  Sodium 135 - 145 mmol/L 140 141 140  Potassium 3.5 - 5.1 mmol/L 4.3 3.8 3.7  Chloride 98 - 111 mmol/L 105 105 105  CO2 22 - 32 mmol/L 26 21(L) 19(L)   Calcium 8.9 - 10.3 mg/dL 9.1 9.2 9.4  Total Protein 6.5 - 8.1 g/dL 6.6 6.8 7.4  Total Bilirubin 0.3 - 1.2 mg/dL 1.2 0.8 0.8  Alkaline Phos 38 - 126 U/L 80 66 69  AST 15 - 41 U/L 35 25 22  ALT 0 - 44 U/L 44 26 27    Lab Results  Component Value Date   WBC 0.6 (LL) 06/17/2019   HGB 13.0 06/17/2019   HCT 38.5 06/17/2019   MCV 86.9 06/17/2019   PLT 114 (L) 06/17/2019   NEUTROABS 0.1 (LL) 06/17/2019    ASSESSMENT & PLAN:  Malignant neoplasm of lower-inner quadrant of right breast of female, estrogen receptor positive ([place]) 05/15/2019:Patient palpated a right breast lump for two years. Diagnostic mammogram showed a 4.5cm right breast mass at the 6 o'clock position involving the overlying skin, 2 benign appearing left breast mass at the 10 o'clock position measuring 2.4cm and 2.1cm, and no axillary adenopathy. Biopsy showed: in the left breast, fibroadenoma and no evidence of malignancy, and in the right breast, IDC with DCIS, grade 3.  ER 70%, PR 30%, Ki-67 40%, HER-2 negative ratio 1.41 T2 N0 stage IIa clinical stage  Treatment plan: 1.Neoadjuvant chemotherapy with dose dense Adriamycin and Cytoxan x4 followed by Taxol x12 starting 06/10/2019 2.Breast conserving surgery with sentinel lymph node biopsy 3.Adjuvant radiation 4.Follow-up adjuvant antiestrogen therapy ----------------------------------------------------------------------------------------------------------------------------------------------- Current treatment: Cycle 1 day 1 dose dense Adriamycin and Cytoxan Echocardiogram: 06/05/2019: EF 55 to 60% normal LV function  Chemo toxicities: 1.  Fatigue 2.  Leukopenia/neutropenia: We will reduce the dosage of cycle 2.  Denies any nausea or vomiting We will set her up for an ultrasound-guided liver biopsy. Return to clinic in 1 week for toxicity check    No orders of the defined types were placed in this encounter.  The patient has a good understanding of the  overall plan. she agrees with it. she will call with any problems that may develop before the next visit here.  Nicholas Lose, MD 06/17/2019  Julious Oka Dorshimer, am acting as scribe for Dr. Nicholas Lose.  I have reviewed the above documentation for accuracy and completeness, and I agree with the above.

## 2019-06-17 ENCOUNTER — Ambulatory Visit (HOSPITAL_COMMUNITY)
Admission: RE | Admit: 2019-06-17 | Discharge: 2019-06-17 | Disposition: A | Discharge status: Home / Self Care | Payer: 59 | Source: Ambulatory Visit | Attending: Hematology and Oncology | Admitting: Hematology and Oncology

## 2019-06-17 ENCOUNTER — Encounter: Payer: Self-pay | Admitting: *Deleted

## 2019-06-17 ENCOUNTER — Other Ambulatory Visit: Payer: Self-pay

## 2019-06-17 ENCOUNTER — Inpatient Hospital Stay (HOSPITAL_BASED_OUTPATIENT_CLINIC_OR_DEPARTMENT_OTHER): Payer: No Typology Code available for payment source | Admitting: Hematology and Oncology

## 2019-06-17 ENCOUNTER — Inpatient Hospital Stay: Payer: No Typology Code available for payment source

## 2019-06-17 ENCOUNTER — Encounter (HOSPITAL_COMMUNITY): Payer: Self-pay | Admitting: Radiology

## 2019-06-17 DIAGNOSIS — Z170 Estrogen receptor positive status [ER+]: Secondary | ICD-10-CM

## 2019-06-17 DIAGNOSIS — C50311 Malignant neoplasm of lower-inner quadrant of right female breast: Secondary | ICD-10-CM

## 2019-06-17 LAB — CBC WITH DIFFERENTIAL (CANCER CENTER ONLY)
Abs Immature Granulocytes: 0.03 10*3/uL (ref 0.00–0.07)
Basophils Absolute: 0 10*3/uL (ref 0.0–0.1)
Basophils Relative: 2 %
Eosinophils Absolute: 0.1 10*3/uL (ref 0.0–0.5)
Eosinophils Relative: 11 %
HCT: 38.5 % (ref 36.0–46.0)
Hemoglobin: 13 g/dL (ref 12.0–15.0)
Immature Granulocytes: 5 %
Lymphocytes Relative: 69 %
Lymphs Abs: 0.4 10*3/uL — ABNORMAL LOW (ref 0.7–4.0)
MCH: 29.3 pg (ref 26.0–34.0)
MCHC: 33.8 g/dL (ref 30.0–36.0)
MCV: 86.9 fL (ref 80.0–100.0)
Monocytes Absolute: 0 10*3/uL — ABNORMAL LOW (ref 0.1–1.0)
Monocytes Relative: 5 %
Neutro Abs: 0.1 10*3/uL — CL (ref 1.7–7.7)
Neutrophils Relative %: 8 %
Platelet Count: 114 10*3/uL — ABNORMAL LOW (ref 150–400)
RBC: 4.43 MIL/uL (ref 3.87–5.11)
RDW: 13.4 % (ref 11.5–15.5)
WBC Count: 0.6 10*3/uL — CL (ref 4.0–10.5)
nRBC: 0 % (ref 0.0–0.2)

## 2019-06-17 LAB — CMP (CANCER CENTER ONLY)
ALT: 44 U/L (ref 0–44)
AST: 35 U/L (ref 15–41)
Albumin: 3.7 g/dL (ref 3.5–5.0)
Alkaline Phosphatase: 80 U/L (ref 38–126)
Anion gap: 9 (ref 5–15)
BUN: 7 mg/dL (ref 6–20)
CO2: 26 mmol/L (ref 22–32)
Calcium: 9.1 mg/dL (ref 8.9–10.3)
Chloride: 105 mmol/L (ref 98–111)
Creatinine: 0.64 mg/dL (ref 0.44–1.00)
GFR, Est AFR Am: >60 mL/min (ref >60)
GFR, Estimated: >60 mL/min (ref >60)
Glucose, Bld: 95 mg/dL (ref 70–99)
Potassium: 4.3 mmol/L (ref 3.5–5.1)
Sodium: 140 mmol/L (ref 135–145)
Total Bilirubin: 1.2 mg/dL (ref 0.3–1.2)
Total Protein: 6.6 g/dL (ref 6.5–8.1)

## 2019-06-17 LAB — PREGNANCY, URINE: Preg Test, Ur: NEGATIVE

## 2019-06-17 NOTE — Progress Notes (Signed)
Kimberly Mueller Female, 33 y.o., 1985/09/20 MRN:  [id number] Phone:  [phone] [name]) PCP:  Jilda Panda, MD Primary Cvg:  [employer] Nap Next Appt With Radiology ([facility]-US 2) 06/22/2019 at 1:00 PM  RE: US Liver Biopsy Received: Today Message Contents  Arne Cleveland, MD  [place], [name]        Ok   [place] core hepatic seg 2 mass  Possible adenoma but r/o breast ca met   DDH   Previous Messages  ----- Message -----  From: Garth Bigness D  Sent: 06/17/2019  2:43 PM EST  To: [facility] Procedure Requests  Subject: US Liver Biopsy                  Procedure:  US Liver Biopsy   Reason:  Malignant neoplasm of lower-inner quadrant of right breast of female, estrogen receptor positive, Left lobe of the liver lesion   History:  [place], CT, [place], MR in computer   Ordering Provider:  Nicholas Lose   Ordering Provider Contact: [phone]

## 2019-06-17 NOTE — Assessment & Plan Note (Signed)
05/15/2019:Patient palpated a right breast lump for two years. Diagnostic mammogram showed a 4.5cm right breast mass at the 6 o'clock position involving the overlying skin, 2 benign appearing left breast mass at the 10 o'clock position measuring 2.4cm and 2.1cm, and no axillary adenopathy. Biopsy showed: in the left breast, fibroadenoma and no evidence of malignancy, and in the right breast, IDC with DCIS, grade 3.  ER 70%, PR 30%, Ki-67 40%, HER-2 negative ratio 1.41 T2 N0 stage IIa clinical stage  Treatment plan: 1.Neoadjuvant chemotherapy with dose dense Adriamycin and Cytoxan x4 followed by Taxol x12 starting 06/10/2019 2.Breast conserving surgery with sentinel lymph node biopsy 3.Adjuvant radiation 4.Follow-up adjuvant antiestrogen therapy ----------------------------------------------------------------------------------------------------------------------------------------------- Current treatment: Cycle 1 day 1 dose dense Adriamycin and Cytoxan Echocardiogram: 06/05/2019: EF 55 to 60% normal LV function Antiemetics reviewed Chemo education completed chemo consent obtained Labs reviewed  Return to clinic in 1 week for toxicity check

## 2019-06-18 ENCOUNTER — Other Ambulatory Visit: Payer: Self-pay | Admitting: Radiology

## 2019-06-22 ENCOUNTER — Ambulatory Visit (HOSPITAL_COMMUNITY)
Admission: RE | Admit: 2019-06-22 | Discharge: 2019-06-22 | Disposition: A | Discharge status: Home / Self Care | Payer: No Typology Code available for payment source | Source: Ambulatory Visit | Attending: Hematology and Oncology | Admitting: Hematology and Oncology

## 2019-06-22 ENCOUNTER — Encounter (HOSPITAL_COMMUNITY): Payer: Self-pay

## 2019-06-22 ENCOUNTER — Other Ambulatory Visit: Payer: Self-pay

## 2019-06-22 DIAGNOSIS — Z8042 Family history of malignant neoplasm of prostate: Secondary | ICD-10-CM | POA: Diagnosis not present

## 2019-06-22 DIAGNOSIS — Z793 Long term (current) use of hormonal contraceptives: Secondary | ICD-10-CM | POA: Insufficient documentation

## 2019-06-22 DIAGNOSIS — Z87891 Personal history of nicotine dependence: Secondary | ICD-10-CM | POA: Insufficient documentation

## 2019-06-22 DIAGNOSIS — Z79899 Other long term (current) drug therapy: Secondary | ICD-10-CM | POA: Diagnosis not present

## 2019-06-22 DIAGNOSIS — Z800 Family history of malignant neoplasm of digestive organs: Secondary | ICD-10-CM | POA: Diagnosis not present

## 2019-06-22 DIAGNOSIS — Z9884 Bariatric surgery status: Secondary | ICD-10-CM | POA: Insufficient documentation

## 2019-06-22 DIAGNOSIS — Z853 Personal history of malignant neoplasm of breast: Secondary | ICD-10-CM | POA: Diagnosis present

## 2019-06-22 DIAGNOSIS — R160 Hepatomegaly, not elsewhere classified: Secondary | ICD-10-CM | POA: Insufficient documentation

## 2019-06-22 DIAGNOSIS — Z170 Estrogen receptor positive status [ER+]: Secondary | ICD-10-CM

## 2019-06-22 DIAGNOSIS — C50311 Malignant neoplasm of lower-inner quadrant of right female breast: Secondary | ICD-10-CM

## 2019-06-22 LAB — CBC WITH DIFFERENTIAL/PLATELET
Abs Immature Granulocytes: 0.32 10*3/uL — ABNORMAL HIGH (ref 0.00–0.07)
Basophils Absolute: 0 10*3/uL (ref 0.0–0.1)
Basophils Relative: 1 %
Eosinophils Absolute: 0.1 10*3/uL (ref 0.0–0.5)
Eosinophils Relative: 2 %
HCT: 35.7 % — ABNORMAL LOW (ref 36.0–46.0)
Hemoglobin: 11.8 g/dL — ABNORMAL LOW (ref 12.0–15.0)
Immature Granulocytes: 10 %
Lymphocytes Relative: 26 %
Lymphs Abs: 0.9 10*3/uL (ref 0.7–4.0)
MCH: 28.2 pg (ref 26.0–34.0)
MCHC: 33.1 g/dL (ref 30.0–36.0)
MCV: 85.2 fL (ref 80.0–100.0)
Monocytes Absolute: 0.6 10*3/uL (ref 0.1–1.0)
Monocytes Relative: 18 %
Neutro Abs: 1.4 10*3/uL — ABNORMAL LOW (ref 1.7–7.7)
Neutrophils Relative %: 43 %
Platelets: 148 10*3/uL — ABNORMAL LOW (ref 150–400)
RBC: 4.19 MIL/uL (ref 3.87–5.11)
RDW: 13 % (ref 11.5–15.5)
WBC: 3.3 10*3/uL — ABNORMAL LOW (ref 4.0–10.5)
nRBC: 0 % (ref 0.0–0.2)

## 2019-06-22 LAB — COMPREHENSIVE METABOLIC PANEL
ALT: 34 U/L (ref 0–44)
AST: 23 U/L (ref 15–41)
Albumin: 3.7 g/dL (ref 3.5–5.0)
Alkaline Phosphatase: 63 U/L (ref 38–126)
Anion gap: 14 (ref 5–15)
BUN: 9 mg/dL (ref 6–20)
CO2: 22 mmol/L (ref 22–32)
Calcium: 9.1 mg/dL (ref 8.9–10.3)
Chloride: 102 mmol/L (ref 98–111)
Creatinine, Ser: 0.65 mg/dL (ref 0.44–1.00)
GFR calc Af Amer: >60 mL/min (ref >60)
GFR calc non Af Amer: >60 mL/min (ref >60)
Glucose, Bld: 97 mg/dL (ref 70–99)
Potassium: 3.4 mmol/L — ABNORMAL LOW (ref 3.5–5.1)
Sodium: 138 mmol/L (ref 135–145)
Total Bilirubin: 0.7 mg/dL (ref 0.3–1.2)
Total Protein: 6.7 g/dL (ref 6.5–8.1)

## 2019-06-22 LAB — PROTIME-INR
INR: 1.1 (ref 0.8–1.2)
Prothrombin Time: 13.8 seconds (ref 11.4–15.2)

## 2019-06-22 MED ORDER — HYDROCODONE-ACETAMINOPHEN 5-325 MG PO TABS: 1.0000 | ORAL_TABLET | ORAL | Status: DC | PRN

## 2019-06-22 MED ORDER — LIDOCAINE HCL (PF) 1 % IJ SOLN
INTRAMUSCULAR | Status: AC | PRN
  Administered 2019-06-22 (×3): 10 mL

## 2019-06-22 MED ORDER — SODIUM CHLORIDE 0.9 % IV SOLN
INTRAVENOUS | Status: DC
  Administered 2019-06-22: 12:00:00 via INTRAVENOUS

## 2019-06-22 MED ORDER — MIDAZOLAM HCL 2 MG/2ML IJ SOLN
INTRAMUSCULAR | Status: AC | PRN
  Administered 2019-06-22 (×4): 1 mg via INTRAVENOUS

## 2019-06-22 MED ORDER — HEPARIN SOD (PORK) LOCK FLUSH 100 UNIT/ML IV SOLN
500.0000 [IU] | Freq: Once | INTRAVENOUS | Status: AC
  Administered 2019-06-22: 17:00:00 500 [IU]
  Filled 2019-06-22: qty 5

## 2019-06-22 MED ORDER — LIDOCAINE HCL 1 % IJ SOLN
INTRAMUSCULAR | Status: AC
  Filled 2019-06-22: qty 20

## 2019-06-22 MED ORDER — FENTANYL CITRATE (PF) 100 MCG/2ML IJ SOLN
INTRAMUSCULAR | Status: AC | PRN
  Administered 2019-06-22: 50 ug via INTRAVENOUS

## 2019-06-22 NOTE — H&P (Signed)
Chief Complaint: Patient was seen in consultation today for biopsy of liver lesion at the request of [place]  Referring Physician(s): Gudena,Vinay  Supervising Physician: Jacqulynn Cadet  Patient Status: [facility] Out[facility]  History of Present Illness: Kimberly Mueller is a 33 y.o. female with hx of breast cancer. On her staging imaging, she is found to have left lobe hepatic lesion, most suspicious for adenoma.  However, given her hx of breast cancer, she is referred for image guided biopsy to rule out mets. PMHx, meds, labs, imaging, allergies reviewed. Feels well, no recent fevers, chills, illness. Has been NPO today as directed.    Past Medical History:  Diagnosis Date  . Cancer [facility])    breast  . Family history of pancreatic cancer   . Family history of prostate cancer     Past Surgical History:  Procedure Laterality Date  . GASTRIC BYPASS    . HERNIA REPAIR    . PORTACATH PLACEMENT Right 06/09/2019   Procedure: INSERTION PORT-A-CATH WITH ULTRASOUND;  Surgeon: Rolm Bookbinder, MD;  Location: [place];  Service: General;  Laterality: Right;    Allergies: Patient has no known allergies.  Medications: Prior to Admission medications   Medication Sig Start Date End Date Taking? Authorizing Provider  dexamethasone (DECADRON) 4 MG tablet Take 1 tablet (4 mg total) by mouth daily. Take 1 tablet day after chemo and 1 tablet 2 days after chemo with food 06/05/19  Yes Nicholas Lose, MD  etonogestrel (NEXPLANON) 68 MG IMPL implant 1 each by Subdermal route once.   Yes [provider]  LORazepam (ATIVAN) 0.5 MG tablet Take 1 tablet (0.5 mg total) by mouth every 6 (six) hours as needed (Nausea or vomiting). 06/05/19  Yes Nicholas Lose, MD  Multiple Vitamin (MULTIVITAMIN) tablet Take 1 tablet by mouth daily.   Yes [provider]  ondansetron (ZOFRAN) 8 MG tablet Take 1 tablet (8 mg total) by mouth 2 (two) times daily as needed. Start on  the third day after chemotherapy. 06/05/19  Yes Nicholas Lose, MD  oxyCODONE (OXY IR/ROXICODONE) 5 MG immediate release tablet Take 1 tablet (5 mg total) by mouth every 6 (six) hours as needed for moderate pain, severe pain or breakthrough pain. 06/09/19  Yes Rolm Bookbinder, MD  prochlorperazine (COMPAZINE) 10 MG tablet Take 1 tablet (10 mg total) by mouth every 6 (six) hours as needed (Nausea or vomiting). 06/05/19  Yes Nicholas Lose, MD  vortioxetine HBr (TRINTELLIX) 5 MG TABS tablet Take 5 mg by mouth daily.   Yes [provider]  Cyanocobalamin (B-12 COMPLIANCE INJECTION) 1000 MCG/ML KIT Inject as directed.    [provider]  lidocaine-prilocaine (EMLA) cream Apply to affected area once 06/05/19   Nicholas Lose, MD     Family History  Problem Relation Age of Onset  . Prostate cancer Father   . Pancreatic cancer Paternal Grandmother     Social History   Socioeconomic History  . Marital status: Married    Spouse name: Not on file  . Number of children: Not on file  . Years of education: Not on file  . Highest education level: Not on file  Occupational History  . Not on file  Tobacco Use  . Smoking status: Former Research scientist (life sciences)  . Smokeless tobacco: Never Used  Substance and Sexual Activity  . Alcohol use: Never  . Drug use: Never  . Sexual activity: Not on file  Other Topics Concern  . Not on file  Social History Narrative  .  Not on file   Social Determinants of Health   Financial Resource Strain:   . Difficulty of Paying Living Expenses: Not on file  Food Insecurity:   . Worried About Charity fundraiser in the Last Year: Not on file  . Ran Out of Food in the Last Year: Not on file  Transportation Needs:   . Lack of Transportation (Medical): Not on file  . Lack of Transportation (Non-Medical): Not on file  Physical Activity:   . Days of Exercise per Week: Not on file  . Minutes of Exercise per Session: Not on file  Stress:   . Feeling of Stress : Not  on file  Social Connections:   . Frequency of Communication with Friends and Family: Not on file  . Frequency of Social Gatherings with Friends and Family: Not on file  . Attends Religious Services: Not on file  . Active Member of Clubs or Organizations: Not on file  . Attends Archivist Meetings: Not on file  . Marital Status: Not on file    Review of Systems: A 12 point ROS discussed and pertinent positives are indicated in the HPI above.  All other systems are negative.  Review of Systems  Vital Signs: Ht '5\' 3"'  (1.6 m)   Wt 78 kg   BMI 30.47 kg/m   Physical Exam Constitutional:      Appearance: Normal appearance.  HENT:     Mouth/Throat:     Mouth: Mucous membranes are moist.     Pharynx: Oropharynx is clear.  Cardiovascular:     Rate and Rhythm: Normal rate and regular rhythm.     Heart sounds: Normal heart sounds.  Pulmonary:     Effort: No respiratory distress.     Breath sounds: Normal breath sounds.  Abdominal:     General: Abdomen is flat. There is no distension.     Palpations: Abdomen is soft. There is no mass.     Tenderness: There is no abdominal tenderness.  Skin:    General: Skin is warm and dry.     Comments: (R)chest port intact, currently accessed  Neurological:     General: No focal deficit present.     Mental Status: She is alert and oriented to person, place, and time.      Labs:  CBC: Recent Labs    05/27/19 0802 06/08/19 0732 06/17/19 1349 06/22/19 1135  WBC 5.3 4.8 0.6* 3.3*  HGB 14.5 14.0 13.0 11.8*  HCT 43.7 42.0 38.5 35.7*  PLT 303 275 114* 148*    COAGS: Recent Labs    06/22/19 1135  INR 1.1    BMP: Recent Labs    05/27/19 0802 06/08/19 0732 06/17/19 1349 06/22/19 1135  NA 140 141 140 138  K 3.7 3.8 4.3 3.4*  CL 105 105 105 102  CO2 19* 21* 26 22  GLUCOSE 102* 91 95 97  BUN 9 5* 7 9  CALCIUM 9.4 9.2 9.1 9.1  CREATININE 0.77 0.71 0.64 0.65  GFRNONAA >60 >60 >60 >60  GFRAA >60 >60 >60 >60     LIVER FUNCTION TESTS: Recent Labs    05/27/19 0802 06/08/19 0732 06/17/19 1349 06/22/19 1135  BILITOT 0.8 0.8 1.2 0.7  AST 22 25 35 23  ALT 27 26 44 34  ALKPHOS 69 66 80 63  PROT 7.4 6.8 6.6 6.7  ALBUMIN 4.3 4.0 3.7 3.7    TUMOR MARKERS: No results for input(s): AFPTM, CEA, CA199, CHROMGRNA in the last  8760 hours.  Assessment and Plan: Hx breast cancer Left lobe hepatic lesion For US guided biopsy Labs ok Risks and benefits of liver biopsy was discussed with the patient and/or patient's family including, but not limited to bleeding, infection, damage to adjacent structures or low yield requiring additional tests.  All of the questions were answered and there is agreement to proceed.  Consent signed and in chart.    Thank you for this interesting consult.  I greatly enjoyed meeting [place] and look forward to participating in their care.  A copy of this report was sent to the requesting provider on this date.  Electronically Signed: Ascencion Dike, PA-C 06/22/2019, 12:36 PM   I spent a total of 25 minutes in face to face in clinical consultation, greater than 50% of which was counseling/coordinating care for liver lesion biopsy

## 2019-06-22 NOTE — Discharge Instructions (Signed)
Liver Biopsy, Care After °These instructions give you information about how to care for yourself after your procedure. Your health care provider may also give you more specific instructions. If you have problems or questions, contact your health care provider. °What can I expect after the procedure? °After your procedure, it is common to have: °· Pain and soreness in the area where the biopsy was done. °· Bruising around the area where the biopsy was done. °· Sleepiness and fatigue for 1-2 days. °Follow these instructions at home: °Medicines °· Take over-the-counter and prescription medicines only as told by your health care provider. °· If you were prescribed an antibiotic medicine, take it as told by your health care provider. Do not stop taking the antibiotic even if you start to feel better. °· Do not take medicines such as aspirin and ibuprofen unless your health care provider tells you to take them. These medicines thin your blood and can increase the risk of bleeding. °· If you are taking prescription pain medicine, take actions to prevent or treat constipation. Your health care provider may recommend that you: °? Drink enough fluid to keep your urine pale yellow. °? Eat foods that are high in fiber, such as fresh fruits and vegetables, whole grains, and beans. °? Limit foods that are high in fat and processed sugars, such as fried or sweet foods. °? Take an over-the-counter or prescription medicine for constipation. °Incision care °· Follow instructions from your health care provider about how to take care of your incision. Make sure you: °? Wash your hands with soap and water before you change your bandage (dressing). If soap and water are not available, use hand sanitizer. °? Change your dressing as told by your health care provider. °? Leave stitches (sutures), skin glue, or adhesive strips in place. These skin closures may need to stay in place for 2 weeks or longer. If adhesive strip edges start to  loosen and curl up, you may trim the loose edges. Do not remove adhesive strips completely unless your health care provider tells you to do that. °· Check your incision area every day for signs of infection. Check for: °? Redness, swelling, or pain. °? Fluid or blood. °? Warmth. °? Pus or a bad smell. °· Do not take baths, swim, or use a hot tub until your health care provider says it is okay to do so. °Activity ° °· Rest at home for 1-2 days, or as directed by your health care provider. °? Avoid sitting for a long time without moving. Get up to take short walks every 1-2 hours. This is important to improve blood flow and breathing. Ask for help if you feel weak or unsteady. °· Return to your normal activities as told by your health care provider. Ask your health care provider what activities are safe for you. °· Do not drive or use heavy machinery while taking prescription pain medicine. °· Do not lift anything that is heavier than 10 lb (4.5 kg), or the limit that your health care provider tells you, until he or she says that it is safe. °· Do not play contact sports for 2 weeks after the procedure. °General instructions ° °· Do not drink alcohol in the first week after the procedure. °· Have someone stay with you for at least 24 hours after the procedure. °· It is your responsibility to obtain your test results. Ask your health care provider, or the department that is doing the test: °? When will my   results be ready? °? How will I get my results? °? What are my treatment options? °? What other tests do I need? °? What are my next steps? °· Keep all follow-up visits as told by your health care provider. This is important. °Contact a health care provider if: °· You have increased bleeding from an incision, resulting in more than a small spot of blood. °· You have redness, swelling, or increasing pain in any incisions. °· You notice a discharge or a bad smell coming from any of your incisions. °· You have a fever or  chills. °Get help right away if: °· You develop swelling, bloating, or pain in your abdomen. °· You become dizzy or faint. °· You develop a rash. °· You have nausea or you vomit. °· You faint, or you have shortness of breath or difficulty breathing. °· You develop chest pain. °· You have problems with your speech or vision. °· You have trouble with your balance or moving your arms or legs. °Summary °· After the liver biopsy, it is common to have pain, soreness, and bruising in the area, as well as sleepiness and fatigue. °· Take over-the-counter and prescription medicines only as told by your health care provider. °· Follow instructions from your health care provider about how to care for your incision. Check the incision area daily for signs of infection. °This information is not intended to replace advice given to you by your health care provider. Make sure you discuss any questions you have with your health care provider. °Document Released: 01/12/2005 Document Revised: 08/18/2018 Document Reviewed: 07/05/2017 °Elsevier Patient Education © 2020 [employer] °Moderate Conscious Sedation, Adult, Care After °These instructions provide you with information about caring for yourself after your procedure. Your health care provider may also give you more specific instructions. Your treatment has been planned according to current medical practices, but problems sometimes occur. Call your health care provider if you have any problems or questions after your procedure. °What can I expect after the procedure? °After your procedure, it is common: °· To feel sleepy for several hours. °· To feel clumsy and have poor balance for several hours. °· To have poor judgment for several hours. °· To vomit if you eat too soon. °Follow these instructions at home: °For at least 24 hours after the procedure: ° °· Do not: °? Participate in activities where you could fall or become injured. °? Drive. °? Use heavy machinery. °? Drink  alcohol. °? Take sleeping pills or medicines that cause drowsiness. °? Make important decisions or sign legal documents. °? Take care of children on your own. °· Rest. °Eating and drinking °· Follow the diet recommended by your health care provider. °· If you vomit: °? Drink water, juice, or soup when you can drink without vomiting. °? Make sure you have little or no nausea before eating solid foods. °General instructions °· Have a responsible adult stay with you until you are awake and alert. °· Take over-the-counter and prescription medicines only as told by your health care provider. °· If you smoke, do not smoke without supervision. °· Keep all follow-up visits as told by your health care provider. This is important. °Contact a health care provider if: °· You keep feeling nauseous or you keep vomiting. °· You feel light-headed. °· You develop a rash. °· You have a fever. °Get help right away if: °· You have trouble breathing. °This information is not intended to replace advice given to you by your health   care provider. Make sure you discuss any questions you have with your health care provider. °Document Released: 04/15/2013 Document Revised: 06/07/2017 Document Reviewed: 10/15/2015 °Elsevier Patient Education © 2020 [employer] ° °

## 2019-06-22 NOTE — Procedures (Signed)
Interventional Radiology Procedure Note  Procedure: US guided core biopsy of liver lesion  Complications: None  Estimated Blood Loss: None  Recommendations: - Bedrest x 3 hrs - DC home  Signed,  Criselda Peaches, MD

## 2019-06-23 LAB — SURGICAL PATHOLOGY

## 2019-06-23 NOTE — Progress Notes (Signed)
Patient Care Team: Kimberly Panda, MD as PCP - General (Internal Medicine) Kimberly Lose, MD as Consulting Physician (Hematology and Oncology) Kimberly Bookbinder, MD as Consulting Physician (General Surgery) Kimberly Pray, MD as Consulting Physician (Radiation Oncology) Kimberly Germany, RN as Oncology Nurse Navigator Kimberly Kaufmann, RN as Oncology Nurse Navigator  DIAGNOSIS:    ICD-10-CM   1. Malignant neoplasm of lower-inner quadrant of right breast of female, estrogen receptor positive ([place])  C50.311    Z17.0     SUMMARY OF ONCOLOGIC HISTORY: Oncology History  Malignant neoplasm of lower-inner quadrant of right breast of female, estrogen receptor positive ([place])  05/25/2019 Initial Diagnosis   Patient palpated a right breast lump for two years. Diagnostic mammogram showed a 4.5cm right breast mass at the 6 o'clock position involving the overlying skin, 2 benign appearing left breast mass at the 10 o'clock position measuring 2.4cm and 2.1cm, and no axillary adenopathy. Biopsy showed: in the left breast, fibroadenoma and no evidence of malignancy, and in the right breast, IDC with DCIS, grade 3.  ER 70%, PR 30%, Ki-67 40%, HER-2 negative by [facility]    06/10/2019 -  Chemotherapy   The patient had DOXOrubicin (ADRIAMYCIN) chemo injection 118 mg, 60 mg/m2 = 118 mg, Intravenous,  Once, 1 of 4 cycles Dose modification: 50 mg/m2 (original dose 60 mg/m2, Cycle 2, Reason: Dose not tolerated) Administration: 118 mg (06/10/2019) palonosetron (ALOXI) injection 0.25 mg, 0.25 mg, Intravenous,  Once, 1 of 4 cycles Administration: 0.25 mg (06/10/2019) pegfilgrastim-cbqv (UDENYCA) injection 6 mg, 6 mg, Subcutaneous, Once, 1 of 4 cycles Administration: 6 mg (06/12/2019) cyclophosphamide (CYTOXAN) 1,180 mg in sodium chloride 0.9 % 250 mL chemo infusion, 600 mg/m2 = 1,180 mg, Intravenous,  Once, 1 of 4 cycles Dose modification: 500 mg/m2 (original dose 600 mg/m2, Cycle 2, Reason: Dose not  tolerated) Administration: 1,180 mg (06/10/2019) PACLitaxel (TAXOL) 156 mg in sodium chloride 0.9 % 250 mL chemo infusion (</= 66m/m2), 80 mg/m2 = 156 mg, Intravenous,  Once, 0 of 12 cycles fosaprepitant (EMEND) 150 mg, dexamethasone (DECADRON) 12 mg in sodium chloride 0.9 % 145 mL IVPB, , Intravenous,  Once, 1 of 4 cycles Administration:  (06/10/2019)  for chemotherapy treatment.     Genetic Testing   No pathogenic variants identified. VUS in DFarmerscalled [id number] identified on the [facility] Common Hereditary Cancers Panel. The report date is 06/15/2019.  The Common Hereditary Cancers Panel offered by [facility] includes sequencing and/or deletion duplication testing of the following 48 genes: APC, ATM, AXIN2, BARD1, BMPR1A, BRCA1, BRCA2, BRIP1, CDH1, CDKN2A (p14ARF), CDKN2A (p16INK4a), CKD4, CHEK2, CTNNA1, DICER1, EPCAM (Deletion/duplication testing only), GREM1 (promoter region deletion/duplication testing only), KIT, MEN1, MLH1, MSH2, MSH3, MSH6, MUTYH, NBN, NF1, NHTL1, PALB2, PDGFRA, PMS2, POLD1, POLE, PTEN, RAD50, RAD51C, RAD51D, RNF43, SDHB, SDHC, SDHD, SMAD4, SMARCA4. STK11, TP53, TSC1, TSC2, and VHL.  The following genes were evaluated for sequence changes only: SDHA and HOXB13 c.251G>A variant only.     CHIEF COMPLIANT: Cycle 2 Adriamycin and Cytoxan  INTERVAL HISTORY: Kimberly Chihuahuais a 33y.o. with above-mentioned history of right breast cancer currently on neoadjuvant chemotherapy with dose dense Adriamycin and Cytoxan. 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 She presents to the clinic today for cycle 2.  Overall she tolerated the chemotherapy extremely well.  Denies any nausea or vomiting.  She is ready for cycle 2.  REVIEW OF SYSTEMS:   Constitutional: Denies fevers, chills or abnormal weight  loss Eyes: Denies blurriness of vision Ears, nose, mouth, throat, and face: Denies mucositis or sore  throat Respiratory: Denies cough, dyspnea or wheezes Cardiovascular: Denies palpitation, chest discomfort Gastrointestinal: Denies nausea, heartburn or change in bowel habits Skin: Denies abnormal skin rashes Lymphatics: Denies new lymphadenopathy or easy bruising Neurological: Denies numbness, tingling or new weaknesses Behavioral/Psych: Mood is stable, no new changes  Extremities: No lower extremity edema Breast: denies any pain or lumps or nodules in either breasts All other systems were reviewed with the patient and are negative.  I have reviewed the past medical history, past surgical history, social history and family history with the patient and they are unchanged from previous note.  ALLERGIES:  has No Known Allergies.  MEDICATIONS:  Current Outpatient Medications  Medication Sig Dispense Refill  . Cyanocobalamin (B-12 COMPLIANCE INJECTION) 1000 MCG/ML KIT Inject as directed.    Marland Kitchen dexamethasone (DECADRON) 4 MG tablet Take 1 tablet (4 mg total) by mouth daily. Take 1 tablet day after chemo and 1 tablet 2 days after chemo with food 8 tablet 0  . etonogestrel (NEXPLANON) 68 MG IMPL implant 1 each by Subdermal route once.    . lidocaine-prilocaine (EMLA) cream Apply to affected area once 30 g 3  . LORazepam (ATIVAN) 0.5 MG tablet Take 1 tablet (0.5 mg total) by mouth every 6 (six) hours as needed (Nausea or vomiting). 30 tablet 0  . Multiple Vitamin (MULTIVITAMIN) tablet Take 1 tablet by mouth daily.    . ondansetron (ZOFRAN) 8 MG tablet Take 1 tablet (8 mg total) by mouth 2 (two) times daily as needed. Start on the third day after chemotherapy. 30 tablet 1  . oxyCODONE (OXY IR/ROXICODONE) 5 MG immediate release tablet Take 1 tablet (5 mg total) by mouth every 6 (six) hours as needed for moderate pain, severe pain or breakthrough pain. 8 tablet 0  . prochlorperazine (COMPAZINE) 10 MG tablet Take 1 tablet (10 mg total) by mouth every 6 (six) hours as needed (Nausea or vomiting). 30  tablet 1  . vortioxetine HBr (TRINTELLIX) 5 MG TABS tablet Take 5 mg by mouth daily.     No current facility-administered medications for this visit.    PHYSICAL EXAMINATION: ECOG PERFORMANCE STATUS: 1 - Symptomatic but completely ambulatory  Vitals:   06/24/19 0959  BP: 106/76  Pulse: 88  Resp: 17  Temp: 98.5 F (36.9 C)  SpO2: 100%   Filed Weights   06/24/19 0959  Weight: 175 lb 6.4 oz (79.6 kg)    GENERAL: alert, no distress and comfortable SKIN: skin color, texture, turgor are normal, no rashes or significant lesions EYES: normal, Conjunctiva are pink and non-injected, sclera clear OROPHARYNX: no exudate, no erythema and lips, buccal mucosa, and tongue normal  NECK: supple, thyroid normal size, non-tender, without nodularity LYMPH: no palpable lymphadenopathy in the cervical, axillary or inguinal LUNGS: clear to auscultation and percussion with normal breathing effort HEART: regular rate & rhythm and no murmurs and no lower extremity edema ABDOMEN: abdomen soft, non-tender and normal bowel sounds MUSCULOSKELETAL: no cyanosis of digits and no clubbing  NEURO: alert & oriented x 3 with fluent speech, no focal motor/sensory deficits EXTREMITIES: No lower extremity edema  LABORATORY DATA:  I have reviewed the data as listed CMP Latest Ref Rng & Units 06/24/2019 06/22/2019 06/17/2019  Glucose 70 - 99 mg/dL 92 97 95  BUN 6 - 20 mg/dL _0 Creatinine 0.44 - 1.00 mg/dL 0.65 0.65 0.64  Sodium 135 - 145 mmol/L  139 138 140  Potassium 3.5 - 5.1 mmol/L 3.8 3.4(L) 4.3  Chloride 98 - 111 mmol/L 105 102 105  CO2 22 - 32 mmol/L _0 Calcium 8.9 - 10.3 mg/dL 9.0 9.1 9.1  Total Protein 6.5 - 8.1 g/dL 6.6 6.7 6.6  Total Bilirubin 0.3 - 1.2 mg/dL 0.5 0.7 1.2  Alkaline Phos 38 - 126 U/L 75 63 80  AST 15 - 41 U/L 29 23 35  ALT 0 - 44 U/L 33 34 44    Lab Results  Component Value Date   WBC 6.6 06/24/2019   HGB 12.1 06/24/2019   HCT 35.0 (L) 06/24/2019   MCV 83.5  06/24/2019   PLT 222 06/24/2019   NEUTROABS 3.9 06/24/2019    ASSESSMENT & PLAN:  Malignant neoplasm of lower-inner quadrant of right breast of female, estrogen receptor positive ([place]) 05/15/2019:Patient palpated a right breast lump for two years. Diagnostic mammogram showed a 4.5cm right breast mass at the 6 o'clock position involving the overlying skin, 2 benign appearing left breast mass at the 10 o'clock position measuring 2.4cm and 2.1cm, and no axillary adenopathy. 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  Fatigue 2.  Leukopenia/neutropenia: We reduced  the dosage of cycle 2.  Denies any nausea or vomiting ultrasound-guided liver biopsy: Benign X-ray left femur: Corresponding to the site of uptake on bone scan is an ill-defined sclerotic focus in the distal femoral diaphysis.  It supports a benign etiology but radiology is recommending MRI  Return to clinic in 2 weeks for cycle 3    No orders of the defined types were placed in this encounter.  The patient has a good understanding of the overall plan. she agrees with it. she will call with any problems that may develop before the next visit here.  Kimberly Lose, MD 06/24/2019  Julious Oka Dorshimer, am acting as scribe  for Dr. Nicholas Mueller.  I have reviewed the above document for accuracy and completeness, and I agree with the above.

## 2019-06-24 ENCOUNTER — Telehealth: Payer: Self-pay | Admitting: *Deleted

## 2019-06-24 ENCOUNTER — Inpatient Hospital Stay: Payer: No Typology Code available for payment source

## 2019-06-24 ENCOUNTER — Other Ambulatory Visit: Payer: Self-pay

## 2019-06-24 ENCOUNTER — Inpatient Hospital Stay (HOSPITAL_BASED_OUTPATIENT_CLINIC_OR_DEPARTMENT_OTHER): Payer: No Typology Code available for payment source | Admitting: Hematology and Oncology

## 2019-06-24 DIAGNOSIS — Z170 Estrogen receptor positive status [ER+]: Secondary | ICD-10-CM

## 2019-06-24 DIAGNOSIS — Z5111 Encounter for antineoplastic chemotherapy: Secondary | ICD-10-CM | POA: Diagnosis not present

## 2019-06-24 DIAGNOSIS — C50311 Malignant neoplasm of lower-inner quadrant of right female breast: Secondary | ICD-10-CM | POA: Diagnosis not present

## 2019-06-24 LAB — CMP (CANCER CENTER ONLY)
ALT: 33 U/L (ref 0–44)
AST: 29 U/L (ref 15–41)
Albumin: 3.7 g/dL (ref 3.5–5.0)
Alkaline Phosphatase: 75 U/L (ref 38–126)
Anion gap: 11 (ref 5–15)
BUN: 7 mg/dL (ref 6–20)
CO2: 23 mmol/L (ref 22–32)
Calcium: 9 mg/dL (ref 8.9–10.3)
Chloride: 105 mmol/L (ref 98–111)
Creatinine: 0.65 mg/dL (ref 0.44–1.00)
GFR, Est AFR Am: >60 mL/min (ref >60)
GFR, Estimated: >60 mL/min (ref >60)
Glucose, Bld: 92 mg/dL (ref 70–99)
Potassium: 3.8 mmol/L (ref 3.5–5.1)
Sodium: 139 mmol/L (ref 135–145)
Total Bilirubin: 0.5 mg/dL (ref 0.3–1.2)
Total Protein: 6.6 g/dL (ref 6.5–8.1)

## 2019-06-24 LAB — CBC WITH DIFFERENTIAL (CANCER CENTER ONLY)
Abs Immature Granulocytes: 0.61 10*3/uL — ABNORMAL HIGH (ref 0.00–0.07)
Basophils Absolute: 0 10*3/uL (ref 0.0–0.1)
Basophils Relative: 1 %
Eosinophils Absolute: 0 10*3/uL (ref 0.0–0.5)
Eosinophils Relative: 1 %
HCT: 35 % — ABNORMAL LOW (ref 36.0–46.0)
Hemoglobin: 12.1 g/dL (ref 12.0–15.0)
Immature Granulocytes: 9 %
Lymphocytes Relative: 16 %
Lymphs Abs: 1 10*3/uL (ref 0.7–4.0)
MCH: 28.9 pg (ref 26.0–34.0)
MCHC: 34.6 g/dL (ref 30.0–36.0)
MCV: 83.5 fL (ref 80.0–100.0)
Monocytes Absolute: 1 10*3/uL (ref 0.1–1.0)
Monocytes Relative: 15 %
Neutro Abs: 3.9 10*3/uL (ref 1.7–7.7)
Neutrophils Relative %: 58 %
Platelet Count: 222 10*3/uL (ref 150–400)
RBC: 4.19 MIL/uL (ref 3.87–5.11)
RDW: 12.9 % (ref 11.5–15.5)
Smear Review: 1
WBC Count: 6.6 10*3/uL (ref 4.0–10.5)
nRBC: 0 % (ref 0.0–0.2)

## 2019-06-24 LAB — PREGNANCY, URINE: Preg Test, Ur: NEGATIVE

## 2019-06-24 MED ORDER — HEPARIN SOD (PORK) LOCK FLUSH 100 UNIT/ML IV SOLN
500.0000 [IU] | Freq: Once | INTRAVENOUS | Status: AC | PRN
  Administered 2019-06-24: 500 [IU]
  Filled 2019-06-24: qty 5

## 2019-06-24 MED ORDER — DOXORUBICIN HCL CHEMO IV INJECTION 2 MG/ML
50.0000 mg/m2 | Freq: Once | INTRAVENOUS | Status: AC
  Administered 2019-06-24: 98 mg via INTRAVENOUS
  Filled 2019-06-24: qty 49

## 2019-06-24 MED ORDER — PALONOSETRON HCL INJECTION 0.25 MG/5ML
0.2500 mg | Freq: Once | INTRAVENOUS | Status: AC
  Administered 2019-06-24: 0.25 mg via INTRAVENOUS

## 2019-06-24 MED ORDER — PALONOSETRON HCL INJECTION 0.25 MG/5ML
INTRAVENOUS | Status: AC
  Filled 2019-06-24: qty 5

## 2019-06-24 MED ORDER — SODIUM CHLORIDE 0.9 % IV SOLN
Freq: Once | INTRAVENOUS | Status: AC
  Filled 2019-06-24: qty 5

## 2019-06-24 MED ORDER — SODIUM CHLORIDE 0.9% FLUSH
10.0000 mL | INTRAVENOUS | Status: DC | PRN
  Administered 2019-06-24: 10 mL
  Filled 2019-06-24: qty 10

## 2019-06-24 MED ORDER — SODIUM CHLORIDE 0.9 % IV SOLN
Freq: Once | INTRAVENOUS | Status: AC
  Filled 2019-06-24: qty 250

## 2019-06-24 MED ORDER — SODIUM CHLORIDE 0.9 % IV SOLN
500.0000 mg/m2 | Freq: Once | INTRAVENOUS | Status: AC
  Administered 2019-06-24: 980 mg via INTRAVENOUS
  Filled 2019-06-24: qty 49

## 2019-06-24 NOTE — Telephone Encounter (Signed)
Called pt with results of xray and liver bx. Pt coming in to see Dr. Lindi Adie and for [facility] cycle 2 today. Informed pt that Dr. Lindi Adie will go into further detail about recommendations for MRI. Received verbal understanding.

## 2019-06-24 NOTE — Patient Instructions (Signed)
[facility] Discharge Instructions for Patients Receiving Chemotherapy  Today you received the following chemotherapy agents:  Adriamycin, Cyclophosphamide  To help prevent nausea and vomiting after your treatment, we encourage you to take your nausea medication as prescribed.   If you develop nausea and vomiting that is not controlled by your nausea medication, call the clinic.   BELOW ARE SYMPTOMS THAT SHOULD BE REPORTED IMMEDIATELY:  *FEVER GREATER THAN 100.5 F  *CHILLS WITH OR WITHOUT FEVER  NAUSEA AND VOMITING THAT IS NOT CONTROLLED WITH YOUR NAUSEA MEDICATION  *UNUSUAL SHORTNESS OF BREATH  *UNUSUAL BRUISING OR BLEEDING  TENDERNESS IN MOUTH AND THROAT WITH OR WITHOUT PRESENCE OF ULCERS  *URINARY PROBLEMS  *BOWEL PROBLEMS  UNUSUAL RASH Items with * indicate a potential emergency and should be followed up as soon as possible.  Feel free to call the clinic should you have any questions or concerns. The clinic phone number is [phone].  Please show the [place] at check-in to the Emergency Department and triage nurse.

## 2019-06-24 NOTE — Assessment & Plan Note (Signed)
05/15/2019:Patient palpated a right breast lump for two years. Diagnostic mammogram showed a 4.5cm right breast mass at the 6 o'clock position involving the overlying skin, 2 benign appearing left breast mass at the 10 o'clock position measuring 2.4cm and 2.1cm, and no axillary adenopathy. 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  Fatigue 2.  Leukopenia/neutropenia: We reduced  the dosage of cycle 2.  Denies any nausea or vomiting ultrasound-guided liver biopsy: Benign X-ray left femur: Corresponding to the site of uptake on bone scan is an ill-defined sclerotic focus in the distal femoral diaphysis.  It supports a benign etiology but radiology is recommending MRI  Return to clinic in 2 weeks for cycle 3

## 2019-06-24 NOTE — Progress Notes (Signed)
Nutrition Assessment   Reason for Assessment:   Patient identified from Breast Clinic.     ASSESSMENT:  33 year old female with right breast cancer. Patient followed by Dr. Lindi Adie and receiving chemotherapy.  [place]    Met with patient during infusion to introduce self and service at [facility].  Patient reports that she has been trying to get in adequate protein but struggles during the week following chemotherapy.  Has been drinking premier protein shakes. Reports eating shredded chicken, greek yogurt and vegetables such as green beans.  Has been avoiding sugary beverages, no carbonation or caffeine.  Drinks water.      Nutrition Focused Physical Exam: deferred   Medications: MVI, vit b 12 injection, dexamethasone, zofran, compazine   Labs: reviewed   Anthropometrics:   Height: 63 inches Weight: 175 lb Noted 05/27/2019 190 lb 9.6 oz BMI: 30    NUTRITION DIAGNOSIS: Food and nutrition knowledge deficit related to recent gastric bypass surgery and new cancer as evidenced by diet education provided   INTERVENTION:  Discussed bariatric diet progression (2 months) post-op through maintenance phase.  Handouts provided.  Encouraged small frequent mini meals emphasizing eating protein first.   Encouraged premier protein shakes and discussed premier protein clear to try as patient has aversion to milk products during the week of treatment.  Also discussed unjury and ensure max protein as options and samples given.  Recommend bariatric specific MVI daily and calcium 528m TID, lifelong following surgery as better meet needs than over the counter MVI.  Handout provided on specific brands that meet bariatric standards.   Contact information provided.   MONITORING, EVALUATION, GOAL: patient will continue to meet nutritional needs with gastric bypass and receiving treatment for breast cancer.     Next Visit:  December 30th during infusion  Majesty Stehlin B. AZenia Resides RCuyama LPine ValleyRegistered Dietitian [phone](pager)

## 2019-06-25 ENCOUNTER — Telehealth: Payer: Self-pay | Admitting: *Deleted

## 2019-06-26 ENCOUNTER — Other Ambulatory Visit: Payer: Self-pay

## 2019-06-26 ENCOUNTER — Inpatient Hospital Stay: Payer: No Typology Code available for payment source

## 2019-06-26 VITALS — BP 116/72 | HR 67 | Temp 98.6°F | Resp 18

## 2019-06-26 DIAGNOSIS — C50311 Malignant neoplasm of lower-inner quadrant of right female breast: Secondary | ICD-10-CM

## 2019-06-26 DIAGNOSIS — Z170 Estrogen receptor positive status [ER+]: Secondary | ICD-10-CM

## 2019-06-26 DIAGNOSIS — Z5111 Encounter for antineoplastic chemotherapy: Secondary | ICD-10-CM | POA: Diagnosis not present

## 2019-06-26 MED ORDER — PEGFILGRASTIM-CBQV 6 MG/0.6ML ~~LOC~~ SOSY
PREFILLED_SYRINGE | SUBCUTANEOUS | Status: AC
  Filled 2019-06-26: qty 0.6

## 2019-06-26 MED ORDER — PEGFILGRASTIM-CBQV 6 MG/0.6ML ~~LOC~~ SOSY
6.0000 mg | PREFILLED_SYRINGE | Freq: Once | SUBCUTANEOUS | Status: AC
  Administered 2019-06-26: 6 mg via SUBCUTANEOUS

## 2019-06-26 NOTE — Progress Notes (Signed)
New - Rash on face - 'cheeks" asymptomatic -she just woke up this morning and saw it . Wearing makeup so I do not visualize it. Gudena notified. A/C 2 days ago.

## 2019-07-02 ENCOUNTER — Ambulatory Visit (HOSPITAL_COMMUNITY)
Admission: RE | Admit: 2019-07-02 | Discharge: 2019-07-02 | Disposition: A | Discharge status: Home / Self Care | Payer: 59 | Source: Ambulatory Visit | Attending: Hematology and Oncology | Admitting: Hematology and Oncology

## 2019-07-02 ENCOUNTER — Other Ambulatory Visit: Payer: Self-pay

## 2019-07-02 ENCOUNTER — Telehealth: Payer: Self-pay | Admitting: Hematology and Oncology

## 2019-07-02 DIAGNOSIS — C50311 Malignant neoplasm of lower-inner quadrant of right female breast: Secondary | ICD-10-CM | POA: Diagnosis not present

## 2019-07-02 DIAGNOSIS — Z170 Estrogen receptor positive status [ER+]: Secondary | ICD-10-CM | POA: Insufficient documentation

## 2019-07-02 MED ORDER — GADOBUTROL 1 MMOL/ML IV SOLN
7.0000 mL | Freq: Once | INTRAVENOUS | Status: AC | PRN
  Administered 2019-07-02: 7 mL via INTRAVENOUS

## 2019-07-02 NOTE — Telephone Encounter (Signed)
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

## 2019-07-03 ENCOUNTER — Encounter (HOSPITAL_COMMUNITY): Payer: Self-pay

## 2019-07-03 ENCOUNTER — Other Ambulatory Visit: Payer: Self-pay

## 2019-07-03 ENCOUNTER — Emergency Department (HOSPITAL_COMMUNITY)
Admission: EM | Admit: 2019-07-03 | Discharge: 2019-07-03 | Disposition: A | Discharge status: Home / Self Care | Payer: No Typology Code available for payment source | Attending: Emergency Medicine | Admitting: Emergency Medicine

## 2019-07-03 DIAGNOSIS — Z79899 Other long term (current) drug therapy: Secondary | ICD-10-CM | POA: Diagnosis not present

## 2019-07-03 DIAGNOSIS — C50311 Malignant neoplasm of lower-inner quadrant of right female breast: Secondary | ICD-10-CM | POA: Diagnosis not present

## 2019-07-03 DIAGNOSIS — Z87891 Personal history of nicotine dependence: Secondary | ICD-10-CM | POA: Insufficient documentation

## 2019-07-03 DIAGNOSIS — K600 Acute anal fissure: Secondary | ICD-10-CM | POA: Diagnosis not present

## 2019-07-03 DIAGNOSIS — Z170 Estrogen receptor positive status [ER+]: Secondary | ICD-10-CM | POA: Insufficient documentation

## 2019-07-03 DIAGNOSIS — K6289 Other specified diseases of anus and rectum: Secondary | ICD-10-CM | POA: Diagnosis present

## 2019-07-03 DIAGNOSIS — K602 Anal fissure, unspecified: Secondary | ICD-10-CM

## 2019-07-03 NOTE — ED Notes (Signed)
Pt ambulatory from triage 

## 2019-07-03 NOTE — Discharge Instructions (Addendum)
Please take prescription to [facility] tomorrow to have it specially compounded Continue doing sitz baths. Increase fiber in your diet. Take your stool softener as prescribed.

## 2019-07-03 NOTE — ED Triage Notes (Signed)
Pt is receiving treatment for breast CA . Pt states that she had a bm last night after being constipated. Pt states that she felt a tear, and has had swelling in her rectal area and discomfort. Pt states that there was bleeding at the time of the BM, not bleeding now. Pt states that she took tylenol, oxycodone, etc. Without relief. Pt states that she was unable to sleep much last night due to the pain. Pt also tried topical hydrocortisone and topical lido without relief.

## 2019-07-03 NOTE — ED Provider Notes (Signed)
[facility] DEPT Provider Note   CSN: [id number] Arrival date & time: 07/03/19  2549     History Chief Complaint  Patient presents with  . Rectal Pain    Kimberly Mueller is a 33 y.o. female with current right breast cancer undergoing chemotherapy who presents to the ED today with sudden onset, constant, throbbing, rectal pain that began last night.  She endorses that she is on a lot of pain medication due to her current cancer.  She states that she was not keeping up with her fiber supplements as well as her stool softener and did not have a bowel movement for greater than 4 days.  Patient did have a bowel movement last night and reports it was very painful and she noticed immediate pain to her rectum afterwards.  She states that she did notice a small amount of blood when wiping and believes she may have caused a anal fissure.  Reports that she has tried over-the-counter hemorrhoid cream, over-the-counter lidocaine, the counter hydrocortisone without relief.  Patient states she has done sitz bath's and she has relief while she is in the bath but when she gets out of the bath she has immediate pain again.  She also took some oxycodone that she had leftover as well as Tylenol little without relief.  She reports pain was so severe last night while attempting to go to sleep and given that it is Christmas and no offices are open she did not know what else to do besides come to the emergency department.  She reports that she has not noticed any bleeding since last night.  She has not had a bowel movement and she is afraid that it might cause worsening pain so she has been limiting her oral intake to avoid this.  Denies fever, chills, abdominal pain, nausea, vomiting, any other associated symptoms.   The history is provided by the patient and medical records.       Past Medical History:  Diagnosis Date  . Cancer [facility])    breast  . Family history of pancreatic cancer    . Family history of prostate cancer     Patient Active Problem List   Diagnosis Date Noted  . Genetic testing 06/15/2019  . Family history of prostate cancer   . Family history of pancreatic cancer   . Malignant neoplasm of lower-inner quadrant of right breast of female, estrogen receptor positive ([place]) 05/25/2019    Past Surgical History:  Procedure Laterality Date  . GASTRIC BYPASS    . HERNIA REPAIR    . PORTACATH PLACEMENT Right 06/09/2019   Procedure: INSERTION PORT-A-CATH WITH ULTRASOUND;  Surgeon: Rolm Bookbinder, MD;  Location: [facility];  Service: General;  Laterality: Right;     OB History   No obstetric history on file.     Family History  Problem Relation Age of Onset  . Prostate cancer Father   . Pancreatic cancer Paternal Grandmother     Social History   Tobacco Use  . Smoking status: Former Research scientist (life sciences)  . Smokeless tobacco: Never Used  Substance Use Topics  . Alcohol use: Never  . Drug use: Never    Home Medications Prior to Admission medications   Medication Sig Start Date End Date Taking? Authorizing Provider  Cyanocobalamin (B-12 COMPLIANCE INJECTION) 1000 MCG/ML KIT Inject as directed.    [provider]  dexamethasone (DECADRON) 4 MG tablet Take 1 tablet (4 mg total) by mouth daily. Take 1 tablet day  after chemo and 1 tablet 2 days after chemo with food 06/05/19   Nicholas Lose, MD  etonogestrel (NEXPLANON) 68 MG IMPL implant 1 each by Subdermal route once.    [provider]  lidocaine-prilocaine (EMLA) cream Apply to affected area once 06/05/19   Nicholas Lose, MD  LORazepam (ATIVAN) 0.5 MG tablet Take 1 tablet (0.5 mg total) by mouth every 6 (six) hours as needed (Nausea or vomiting). 06/05/19   Nicholas Lose, MD  Multiple Vitamin (MULTIVITAMIN) tablet Take 1 tablet by mouth daily.    [provider]  ondansetron (ZOFRAN) 8 MG tablet Take 1 tablet (8 mg total) by mouth 2 (two) times daily as needed.  Start on the third day after chemotherapy. 06/05/19   Nicholas Lose, MD  oxyCODONE (OXY IR/ROXICODONE) 5 MG immediate release tablet Take 1 tablet (5 mg total) by mouth every 6 (six) hours as needed for moderate pain, severe pain or breakthrough pain. 06/09/19   Rolm Bookbinder, MD  prochlorperazine (COMPAZINE) 10 MG tablet Take 1 tablet (10 mg total) by mouth every 6 (six) hours as needed (Nausea or vomiting). 06/05/19   Nicholas Lose, MD  vortioxetine HBr (TRINTELLIX) 5 MG TABS tablet Take 5 mg by mouth daily.    [provider]    Allergies    Patient has no known allergies.  Review of Systems   Review of Systems  Constitutional: Negative for chills and fever.  Gastrointestinal: Positive for rectal pain. Negative for abdominal pain, nausea and vomiting.  Genitourinary: Negative for difficulty urinating.  Skin: Negative for rash.  Neurological: Negative for dizziness and light-headedness.  All other systems reviewed and are negative.   Physical Exam Updated Vital Signs BP (!) 123/110 (BP Location: Right Arm)   Pulse 86   Temp 98.1 F (36.7 C) (Oral)   Resp 17   SpO2 100%   Physical Exam Vitals and nursing note reviewed.  Constitutional:      Appearance: She is not ill-appearing.  HENT:     Head: Normocephalic and atraumatic.  Eyes:     Conjunctiva/sclera: Conjunctivae normal.  Cardiovascular:     Rate and Rhythm: Normal rate and regular rhythm.  Pulmonary:     Effort: Pulmonary effort is normal.     Breath sounds: Normal breath sounds.  Abdominal:     Palpations: Abdomen is soft.     Tenderness: There is no abdominal tenderness.  Genitourinary:    Comments: Female chaperone present Kelby Aline, RN Mild swelling noted diffusely around rectum. Small fissure noted at 6 o'clock anteriorly. No active bleeding. Pt unable to tolerate DRE due to pain.  Musculoskeletal:     Cervical back: Neck supple.  Skin:    General: Skin is warm and dry.   Neurological:     Mental Status: She is alert.     ED Results / Procedures / Treatments   Labs (all labs ordered are listed, but only abnormal results are displayed) Labs Reviewed - No data to display  EKG None  Radiology MR FEMUR LEFT W WO CONTRAST  Result Date: 07/02/2019 CLINICAL DATA:  History of metastatic breast cancer. History of metastatic breast cancer. EXAM: MR OF THE LEFT LOWER EXTREMITY WITHOUT AND WITH CONTRAST TECHNIQUE: Multiplanar, multisequence MR imaging of the left femur was performed both before and after administration of intravenous contrast. CONTRAST:  7 mL GADAVIST IV COMPARISON:  Whole-body bone scan 06/12/2019. Plain films left femur 06/17/2019. FINDINGS: Bones/Joint/Cartilage There is no evidence of metastatic disease or worrisome marrow  lesion. A tiny focus of mildly increased T2 and decreased T1 signal in the diaphysis of the distal femur could be a tiny enchondroma or focus active marrow. The patient also has a well-marginated T2 hyperintense lesion in the medial metaphysis of the right femur which is visualized in the coronal plane only. This may be a bone cyst or old nonossifying fibroma. There is no fracture or other acute abnormality. Ligaments Negative. Muscles and Tendons Negative. Soft tissues Negative. IMPRESSION: Negative for metastatic disease or acute abnormality. Focus on bone scan likely represents a tiny enchondroma or focus of active marrow. No follow-up imaging is recommended. Electronically Signed   By: Inge Rise M.D.   On: 07/02/2019 12:04    Procedures Procedures (including critical care time)  Medications Ordered in ED Medications - No data to display  ED Course  I have reviewed the triage vital signs and the nursing notes.  Pertinent labs & imaging results that were available during my care of the patient were reviewed by me and considered in my medical decision making (see chart for details).  33 year old female who is  currently undergoing treatment for breast cancer presents today with rectal pain after having bowel movement last night.  For she is on pain medication due to her breast cancer and has not been keeping up appropriately with stool softeners and MiraLAX.  Had bowel movement for 4 days until yesterday.  On exam patient appears uncomfortable.  She has obvious fissure during rectal exam.  Discussed case with our pharmacist on-call who unfortunately is unable to compound nifedipine ointment here in the ED.  With it being Christmas none of the compounding pharmacies are open today.  I have written patient a paper prescription for her to take to a compounding pharmacy tomorrow.  I have called in hydrocortisone intrarectal cream for patient today to 24 hour pharmacy as well to hold her over until tomorrow. Pt advised to continue doing sitz baths and to take her stool softener as prescribed. She is in agreement with plan at this time and stable for discharge home.   This note was prepared using Dragon voice recognition software and may include unintentional dictation errors due to the inherent limitations of voice recognition software.     MDM Rules/Calculators/A&P                       Final Clinical Impression(s) / ED Diagnoses Final diagnoses:  Anal fissure    Rx / DC Orders ED Discharge Orders    None       Discharge Instructions     Please take prescription to [facility] tomorrow to have it specially compounded Continue doing sitz baths. Increase fiber in your diet. Take your stool softener as prescribed.        Eustaquio Maize, PA-C 07/03/19 1916    Wyvonnia Dusky, MD 07/03/19 [phone]

## 2019-07-06 ENCOUNTER — Encounter: Payer: Self-pay | Admitting: *Deleted

## 2019-07-07 NOTE — Progress Notes (Signed)
Patient Care Team: Kimberly Panda, MD as PCP - General (Internal Medicine) Kimberly Lose, MD as Consulting Physician (Hematology and Oncology) Kimberly Bookbinder, MD as Consulting Physician (General Surgery) Kimberly Pray, MD as Consulting Physician (Radiation Oncology) Kimberly Germany, RN as Oncology Nurse Navigator Kimberly Kaufmann, RN as Oncology Nurse Navigator  DIAGNOSIS:    ICD-10-CM   1. Malignant neoplasm of lower-inner quadrant of right breast of female, estrogen receptor positive ([place])  C50.311    Z17.0     SUMMARY OF ONCOLOGIC HISTORY: Oncology History  Malignant neoplasm of lower-inner quadrant of right breast of female, estrogen receptor positive ([place])  05/25/2019 Initial Diagnosis   Patient palpated a right breast lump for two years. Diagnostic mammogram showed a 4.5cm right breast mass at the 6 o'clock position involving the overlying skin, 2 benign appearing left breast mass at the 10 o'clock position measuring 2.4cm and 2.1cm, and no axillary adenopathy. Biopsy showed: in the left breast, fibroadenoma and no evidence of malignancy, and in the right breast, IDC with DCIS, grade 3.  ER 70%, PR 30%, Ki-67 40%, HER-2 negative by [facility]    06/10/2019 -  Chemotherapy   The patient had DOXOrubicin (ADRIAMYCIN) chemo injection 118 mg, 60 mg/m2 = 118 mg, Intravenous,  Once, 3 of 4 cycles Dose modification: 50 mg/m2 (original dose 60 mg/m2, Cycle 2, Reason: Dose not tolerated) Administration: 118 mg (06/10/2019), 98 mg (06/24/2019) palonosetron (ALOXI) injection 0.25 mg, 0.25 mg, Intravenous,  Once, 3 of 4 cycles Administration: 0.25 mg (06/10/2019), 0.25 mg (06/24/2019) pegfilgrastim-cbqv (UDENYCA) injection 6 mg, 6 mg, Subcutaneous, Once, 3 of 4 cycles Administration: 6 mg (06/12/2019), 6 mg (06/26/2019) cyclophosphamide (CYTOXAN) 1,180 mg in sodium chloride 0.9 % 250 mL chemo infusion, 600 mg/m2 = 1,180 mg, Intravenous,  Once, 3 of 4 cycles Dose modification: 500 mg/m2 (original  dose 600 mg/m2, Cycle 2, Reason: Dose not tolerated) Administration: 1,180 mg (06/10/2019), 980 mg (06/24/2019) PACLitaxel (TAXOL) 156 mg in sodium chloride 0.9 % 250 mL chemo infusion (</= 66m/m2), 80 mg/m2 = 156 mg, Intravenous,  Once, 0 of 12 cycles fosaprepitant (EMEND) 150 mg, dexamethasone (DECADRON) 12 mg in sodium chloride 0.9 % 145 mL IVPB, , Intravenous,  Once, 3 of 4 cycles Administration:  (06/10/2019),  (06/24/2019)  for chemotherapy treatment.     Genetic Testing   No pathogenic variants identified. VUS in [place] c.[id number] identified on the [facility] Common Hereditary Cancers Panel. The report date is 06/15/2019.  The Common Hereditary Cancers Panel offered by [facility] includes sequencing and/or deletion duplication testing of the following 48 genes: APC, ATM, AXIN2, BARD1, BMPR1A, BRCA1, BRCA2, BRIP1, CDH1, CDKN2A (p14ARF), CDKN2A (p16INK4a), CKD4, CHEK2, CTNNA1, DICER1, EPCAM (Deletion/duplication testing only), GREM1 (promoter region deletion/duplication testing only), KIT, MEN1, MLH1, MSH2, MSH3, MSH6, MUTYH, NBN, NF1, NHTL1, PALB2, PDGFRA, PMS2, POLD1, POLE, PTEN, RAD50, RAD51C, RAD51D, RNF43, SDHB, SDHC, SDHD, SMAD4, SMARCA4. STK11, TP53, TSC1, TSC2, and VHL.  The following genes were evaluated for sequence changes only: SDHA and HOXB13 c.251G>A variant only.     CHIEF COMPLIANT: Cycle 3 Adriamycin and Cytoxan  INTERVAL HISTORY: [name] a 33y.o. with above-mentioned history of right breast cancer currently on neoadjuvant chemotherapy with dose dense Adriamycin and Cytoxan. MRI of the left femur on 07/02/19 showed no evidence of metastatic disease or acute abnormality. She presents to the clinic todayfor cycle 3.  She went to emergency room because of severe constipation abdominal pain.  She forced the stool out and caused a fissure which caused  a lot of pain.  She was given medication for that and she is improving slowly.  She is trying her best not to get  constipated again.  REVIEW OF SYSTEMS:   Constitutional: Denies fevers, chills or abnormal weight loss Eyes: Denies blurriness of vision Ears, nose, mouth, throat, and face: Denies mucositis or sore throat Respiratory: Denies cough, dyspnea or wheezes Cardiovascular: Denies palpitation, chest discomfort Gastrointestinal: Constipation, anal fissure Skin: Denies abnormal skin rashes Lymphatics: Denies new lymphadenopathy or easy bruising Neurological: Denies numbness, tingling or new weaknesses Behavioral/Psych: Mood is stable, no new changes  Extremities: No lower extremity edema Breast: denies any pain or lumps or nodules in either breasts All other systems were reviewed with the patient and are negative.  I have reviewed the past medical history, past surgical history, social history and family history with the patient and they are unchanged from previous note.  ALLERGIES:  has No Known Allergies.  MEDICATIONS:  Current Outpatient Medications  Medication Sig Dispense Refill  . Cyanocobalamin (B-12 COMPLIANCE INJECTION) 1000 MCG/ML KIT Inject as directed.    Marland Kitchen dexamethasone (DECADRON) 4 MG tablet Take 1 tablet (4 mg total) by mouth daily. Take 1 tablet day after chemo and 1 tablet 2 days after chemo with food 8 tablet 0  . etonogestrel (NEXPLANON) 68 MG IMPL implant 1 each by Subdermal route once.    . lidocaine-prilocaine (EMLA) cream Apply to affected area once 30 g 3  . LORazepam (ATIVAN) 0.5 MG tablet Take 1 tablet (0.5 mg total) by mouth every 6 (six) hours as needed (Nausea or vomiting). 30 tablet 0  . Multiple Vitamin (MULTIVITAMIN) tablet Take 1 tablet by mouth daily.    . ondansetron (ZOFRAN) 8 MG tablet Take 1 tablet (8 mg total) by mouth 2 (two) times daily as needed. Start on the third day after chemotherapy. 30 tablet 1  . prochlorperazine (COMPAZINE) 10 MG tablet Take 1 tablet (10 mg total) by mouth every 6 (six) hours as needed (Nausea or vomiting). 30 tablet 1  .  vortioxetine HBr (TRINTELLIX) 5 MG TABS tablet Take 5 mg by mouth daily.     No current facility-administered medications for this visit.   Facility-Administered Medications Ordered in Other Visits  Medication Dose Route Frequency Provider Last Rate Last Admin  . cyclophosphamide (CYTOXAN) 980 mg in sodium chloride 0.9 % 250 mL chemo infusion  500 mg/m2 (Treatment Plan Recorded) Intravenous Once Kimberly Lose, MD      . DOXOrubicin (ADRIAMYCIN) chemo injection 98 mg  50 mg/m2 (Treatment Plan Recorded) Intravenous Once Kimberly Lose, MD      . heparin lock flush 100 unit/mL  500 Units Intracatheter Once PRN Kimberly Lose, MD      . sodium chloride flush (NS) 0.9 % injection 10 mL  10 mL Intracatheter PRN Kimberly Lose, MD        PHYSICAL EXAMINATION: ECOG PERFORMANCE STATUS: 1 - Symptomatic but completely ambulatory  Vitals:   07/08/19 0829  BP: 109/68  Pulse: 83  Resp: 18  Temp: 98.7 F (37.1 C)  SpO2: 100%   Filed Weights   07/08/19 0829  Weight: 166 lb 12.8 oz (75.7 kg)    GENERAL: alert, no distress and comfortable SKIN: skin color, texture, turgor are normal, no rashes or significant lesions EYES: normal, Conjunctiva are pink and non-injected, sclera clear OROPHARYNX: no exudate, no erythema and lips, buccal mucosa, and tongue normal  NECK: supple, thyroid normal size, non-tender, without nodularity LYMPH: no palpable lymphadenopathy in the cervical,  axillary or inguinal LUNGS: clear to auscultation and percussion with normal breathing effort HEART: regular rate & rhythm and no murmurs and no lower extremity edema ABDOMEN: abdomen soft, non-tender and normal bowel sounds MUSCULOSKELETAL: no cyanosis of digits and no clubbing  NEURO: alert & oriented x 3 with fluent speech, no focal motor/sensory deficits EXTREMITIES: No lower extremity edema  LABORATORY DATA:  I have reviewed the data as listed CMP Latest Ref Rng & Units 07/08/2019 06/24/2019 06/22/2019  Glucose 70 -  99 mg/dL 89 92 97  BUN 6 - 20 mg/dL 5(L) 7 9  Creatinine 0.44 - 1.00 mg/dL 0.65 0.65 0.65  Sodium 135 - 145 mmol/L 137 139 138  Potassium 3.5 - 5.1 mmol/L 3.4(L) 3.8 3.4(L)  Chloride 98 - 111 mmol/L 104 105 102  CO2 22 - 32 mmol/L '22 23 22  ' Calcium 8.9 - 10.3 mg/dL 8.8(L) 9.0 9.1  Total Protein 6.5 - 8.1 g/dL 6.5 6.6 6.7  Total Bilirubin 0.3 - 1.2 mg/dL 0.4 0.5 0.7  Alkaline Phos 38 - 126 U/L 67 75 63  AST 15 - 41 U/L '22 29 23  ' ALT 0 - 44 U/L 23 33 34    Lab Results  Component Value Date   WBC 4.3 07/08/2019   HGB 11.7 (L) 07/08/2019   HCT 34.1 (L) 07/08/2019   MCV 84.6 07/08/2019   PLT 205 07/08/2019   NEUTROABS 2.9 07/08/2019    ASSESSMENT & PLAN:  Malignant neoplasm of lower-inner quadrant of right breast of female, estrogen receptor positive ([place]) 05/15/2019:Patient palpated a right breast lump for two years. Diagnostic mammogram showed a 4.5cm right breast mass at the 6 o'clock position involving the overlying skin, 2 benign appearing left breast mass at the 10 o'clock position measuring 2.4cm and 2.1cm, and no axillary adenopathy. 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%, HER-2 negative ratio 1.41 T2 N0 stage IIa clinical stage  Treatment plan: 1.Neoadjuvant chemotherapywith dose dense Adriamycin and Cytoxan x4 followed by Taxol x12starting 06/10/2019 2.Breast conserving surgery with sentinel lymph node biopsy 3.Adjuvant radiation 4.Follow-up adjuvant antiestrogen therapy (patient may be opting for oophorectomy plus anastrozole) ----------------------------------------------------------------------------------------------------------------------------------------------- Current treatment:Cycle 3 dose dense Adriamycin and Cytoxan Echocardiogram: 06/05/2019: EF 55 to 60% normal LV function  Chemo toxicities: 1.Fatigue 2.Leukopenia/neutropenia:  We reduced  the dosage of cycle 2. 3.  Constipation: Required visit to the urgent care for anal fissure  I instructed her to get the contraceptive implant removed as soon as possible. Denies any nausea or vomiting  ultrasound-guided liver biopsy: Benign X-ray left femur: Corresponding to the site of uptake on bone scan is an ill-defined sclerotic focus in the distal femoral diaphysis.  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  The patient has a good understanding of the overall plan. she agrees with it. she will call with any problems that may develop before the next visit here.  Kimberly Lose, MD 07/08/2019  Julious Oka Dorshimer, am acting as scribe for Dr. Nicholas Mueller.  I have reviewed the above document for accuracy and completeness, and I agree with the above.

## 2019-07-08 ENCOUNTER — Other Ambulatory Visit: Payer: Self-pay

## 2019-07-08 ENCOUNTER — Inpatient Hospital Stay (HOSPITAL_BASED_OUTPATIENT_CLINIC_OR_DEPARTMENT_OTHER): Payer: No Typology Code available for payment source | Admitting: Hematology and Oncology

## 2019-07-08 ENCOUNTER — Inpatient Hospital Stay: Payer: No Typology Code available for payment source

## 2019-07-08 DIAGNOSIS — Z170 Estrogen receptor positive status [ER+]: Secondary | ICD-10-CM

## 2019-07-08 DIAGNOSIS — Z5111 Encounter for antineoplastic chemotherapy: Secondary | ICD-10-CM | POA: Diagnosis not present

## 2019-07-08 DIAGNOSIS — C50311 Malignant neoplasm of lower-inner quadrant of right female breast: Secondary | ICD-10-CM | POA: Diagnosis not present

## 2019-07-08 LAB — CBC WITH DIFFERENTIAL (CANCER CENTER ONLY)
Abs Immature Granulocytes: 0.27 10*3/uL — ABNORMAL HIGH (ref 0.00–0.07)
Basophils Absolute: 0.1 10*3/uL (ref 0.0–0.1)
Basophils Relative: 2 %
Eosinophils Absolute: 0 10*3/uL (ref 0.0–0.5)
Eosinophils Relative: 0 %
HCT: 34.1 % — ABNORMAL LOW (ref 36.0–46.0)
Hemoglobin: 11.7 g/dL — ABNORMAL LOW (ref 12.0–15.0)
Immature Granulocytes: 6 %
Lymphocytes Relative: 12 %
Lymphs Abs: 0.5 10*3/uL — ABNORMAL LOW (ref 0.7–4.0)
MCH: 29 pg (ref 26.0–34.0)
MCHC: 34.3 g/dL (ref 30.0–36.0)
MCV: 84.6 fL (ref 80.0–100.0)
Monocytes Absolute: 0.6 10*3/uL (ref 0.1–1.0)
Monocytes Relative: 13 %
Neutro Abs: 2.9 10*3/uL (ref 1.7–7.7)
Neutrophils Relative %: 67 %
Platelet Count: 205 10*3/uL (ref 150–400)
RBC: 4.03 MIL/uL (ref 3.87–5.11)
RDW: 13.2 % (ref 11.5–15.5)
WBC Count: 4.3 10*3/uL (ref 4.0–10.5)
nRBC: 0.5 % — ABNORMAL HIGH (ref 0.0–0.2)

## 2019-07-08 LAB — PREGNANCY, URINE: Preg Test, Ur: NEGATIVE

## 2019-07-08 LAB — CMP (CANCER CENTER ONLY)
ALT: 23 U/L (ref 0–44)
AST: 22 U/L (ref 15–41)
Albumin: 3.9 g/dL (ref 3.5–5.0)
Alkaline Phosphatase: 67 U/L (ref 38–126)
Anion gap: 11 (ref 5–15)
BUN: 5 mg/dL — ABNORMAL LOW (ref 6–20)
CO2: 22 mmol/L (ref 22–32)
Calcium: 8.8 mg/dL — ABNORMAL LOW (ref 8.9–10.3)
Chloride: 104 mmol/L (ref 98–111)
Creatinine: 0.65 mg/dL (ref 0.44–1.00)
GFR, Est AFR Am: >60 mL/min (ref >60)
GFR, Estimated: >60 mL/min (ref >60)
Glucose, Bld: 89 mg/dL (ref 70–99)
Potassium: 3.4 mmol/L — ABNORMAL LOW (ref 3.5–5.1)
Sodium: 137 mmol/L (ref 135–145)
Total Bilirubin: 0.4 mg/dL (ref 0.3–1.2)
Total Protein: 6.5 g/dL (ref 6.5–8.1)

## 2019-07-08 MED ORDER — SODIUM CHLORIDE 0.9 % IV SOLN
500.0000 mg/m2 | Freq: Once | INTRAVENOUS | Status: AC
  Administered 2019-07-08: 980 mg via INTRAVENOUS
  Filled 2019-07-08: qty 49

## 2019-07-08 MED ORDER — SODIUM CHLORIDE 0.9% FLUSH
10.0000 mL | Freq: Once | INTRAVENOUS | Status: AC
  Administered 2019-07-08: 10 mL
  Filled 2019-07-08: qty 10

## 2019-07-08 MED ORDER — DOXORUBICIN HCL CHEMO IV INJECTION 2 MG/ML
50.0000 mg/m2 | Freq: Once | INTRAVENOUS | Status: AC
  Administered 2019-07-08: 98 mg via INTRAVENOUS
  Filled 2019-07-08: qty 49

## 2019-07-08 MED ORDER — PALONOSETRON HCL INJECTION 0.25 MG/5ML
INTRAVENOUS | Status: AC
  Filled 2019-07-08: qty 5

## 2019-07-08 MED ORDER — SODIUM CHLORIDE 0.9 % IV SOLN
Freq: Once | INTRAVENOUS | Status: AC
  Filled 2019-07-08: qty 250

## 2019-07-08 MED ORDER — HEPARIN SOD (PORK) LOCK FLUSH 100 UNIT/ML IV SOLN
500.0000 [IU] | Freq: Once | INTRAVENOUS | Status: AC | PRN
  Administered 2019-07-08: 500 [IU]
  Filled 2019-07-08: qty 5

## 2019-07-08 MED ORDER — SODIUM CHLORIDE 0.9 % IV SOLN
Freq: Once | INTRAVENOUS | Status: AC
  Filled 2019-07-08: qty 5

## 2019-07-08 MED ORDER — SODIUM CHLORIDE 0.9% FLUSH
10.0000 mL | INTRAVENOUS | Status: DC | PRN
  Administered 2019-07-08: 10 mL
  Filled 2019-07-08: qty 10

## 2019-07-08 MED ORDER — PALONOSETRON HCL INJECTION 0.25 MG/5ML
0.2500 mg | Freq: Once | INTRAVENOUS | Status: AC
  Administered 2019-07-08: 0.25 mg via INTRAVENOUS

## 2019-07-08 NOTE — Assessment & Plan Note (Signed)
05/15/2019:Patient palpated a right breast lump for two years. Diagnostic mammogram showed a 4.5cm right breast mass at the 6 o'clock position involving the overlying skin, 2 benign appearing left breast mass at the 10 o'clock position measuring 2.4cm and 2.1cm, and no axillary adenopathy. 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%, HER-2 negative ratio 1.41 T2 N0 stage IIa clinical stage  Treatment plan: 1.Neoadjuvant chemotherapywith dose dense Adriamycin and Cytoxan x4 followed by Taxol x12starting 06/10/2019 2.Breast conserving surgery with sentinel lymph node biopsy 3.Adjuvant radiation 4.Follow-up adjuvant antiestrogen therapy ----------------------------------------------------------------------------------------------------------------------------------------------- Current treatment:Cycle 3 dose dense Adriamycin and Cytoxan Echocardiogram: 06/05/2019: EF 55 to 60% normal LV function  Chemo toxicities: 1.Fatigue 2.Leukopenia/neutropenia: We reduced  the dosage of cycle 2.  Denies any nausea or vomiting  ultrasound-guided liver biopsy: Benign X-ray left femur: Corresponding to the site of uptake on bone scan is an ill-defined sclerotic focus in the distal femoral diaphysis.  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

## 2019-07-08 NOTE — Progress Notes (Addendum)
Nutrition Follow-up:  Patient with right breast cancer.  Followed by Dr. Lindi Adie and receiving chemotherapy.  [place]  Noted visit to ED with constipation and anal fissure.  Met with patient during infusion. Patient reports decreased appetite with pain from constipation and anal fissure.  Patient was mostly doing liquids until few days ago.  Reports yesterday ate applesauce. 2 saltine crackers, broccoli casserole (no meat), a bite of [place], 1/2 of crack and egg protein scramble and sips of cranberry juice.  Reports that she is trying to drink plenty of water as it is tasting good to her at this time.  Likes and is using genepro protein powder (30g per scoop).  Drinking premier protein shakes but not as often as before.    Has ordered bariatric MVI but they have not come in yet.   Modified Nutrition focused physical exam done:  Normal orbital region and buccal region, mild temporal region, normal clavicle region, normal shoulder/acromion process/clavicle region, normal thigh, calf and patellar region    Medications: colace about every other day  Labs: K 3.4  Anthropometrics:   Weight 166 lb today decreased from 175 lb on 12/16  5% weight loss in the last 2 weeks  190 lb  05/27/2019 13% weight loss in the last month and half.     NUTRITION DIAGNOSIS: Unintentional weight loss related to cancer, recent bariatric surgery, constipation as evidenced by    INTERVENTION:  Goal would be to slow weight loss down and discussed this with patient today.  Recommend patient eat q 3 hours protein food first then other foods.   Recommend protein goal of 80 g daily with treatment. Discussed ways to add protein into diet using protein foods and protein powders and shakes.   Encouraged 45-64 oz fluids daily to help with constipation. Use stool softners to as needed. Liberalize diet to include more carbohydrate as tolerated to help with  rapid weight loss. Cautioned high sugary foods with possibility of dumping syndrome. Patient has contact information    MONITORING, EVALUATION, GOAL: Patient will continue to meet nutritional needs with gastric bypass and receiving treatment for breast cancer.     NEXT VISIT: Jan 13th during infusion  Billyjoe Go B. Zenia Resides, [place], [place] Registered Dietitian [phone] (pager)

## 2019-07-08 NOTE — Patient Instructions (Addendum)
[facility] Discharge Instructions for Patients Receiving Chemotherapy  Today you received the following chemotherapy agents:  Adriamycin (Doxorubicin), Cytoxan (Cyclophosphamide)  To help prevent nausea and vomiting after your treatment, we encourage you to take your nausea medication as prescribed.   If you develop nausea and vomiting that is not controlled by your nausea medication, call the clinic.   BELOW ARE SYMPTOMS THAT SHOULD BE REPORTED IMMEDIATELY:  *FEVER GREATER THAN 100.5 F  *CHILLS WITH OR WITHOUT FEVER  NAUSEA AND VOMITING THAT IS NOT CONTROLLED WITH YOUR NAUSEA MEDICATION  *UNUSUAL SHORTNESS OF BREATH  *UNUSUAL BRUISING OR BLEEDING  TENDERNESS IN MOUTH AND THROAT WITH OR WITHOUT PRESENCE OF ULCERS  *URINARY PROBLEMS  *BOWEL PROBLEMS  UNUSUAL RASH Items with * indicate a potential emergency and should be followed up as soon as possible.  Feel free to call the clinic should you have any questions or concerns. The clinic phone number is [phone].  Please show the [place] at check-in to the Emergency Department and triage nurse.

## 2019-07-10 ENCOUNTER — Inpatient Hospital Stay: Payer: No Typology Code available for payment source | Attending: Hematology and Oncology

## 2019-07-10 VITALS — BP 96/71 | HR 76 | Temp 98.3°F | Resp 18

## 2019-07-10 DIAGNOSIS — Z800 Family history of malignant neoplasm of digestive organs: Secondary | ICD-10-CM | POA: Diagnosis not present

## 2019-07-10 DIAGNOSIS — Z5111 Encounter for antineoplastic chemotherapy: Secondary | ICD-10-CM | POA: Insufficient documentation

## 2019-07-10 DIAGNOSIS — E876 Hypokalemia: Secondary | ICD-10-CM | POA: Diagnosis not present

## 2019-07-10 DIAGNOSIS — R5383 Other fatigue: Secondary | ICD-10-CM | POA: Diagnosis not present

## 2019-07-10 DIAGNOSIS — R110 Nausea: Secondary | ICD-10-CM | POA: Insufficient documentation

## 2019-07-10 DIAGNOSIS — Z8042 Family history of malignant neoplasm of prostate: Secondary | ICD-10-CM | POA: Diagnosis not present

## 2019-07-10 DIAGNOSIS — K5900 Constipation, unspecified: Secondary | ICD-10-CM | POA: Diagnosis not present

## 2019-07-10 DIAGNOSIS — D72819 Decreased white blood cell count, unspecified: Secondary | ICD-10-CM | POA: Insufficient documentation

## 2019-07-10 DIAGNOSIS — K602 Anal fissure, unspecified: Secondary | ICD-10-CM | POA: Insufficient documentation

## 2019-07-10 DIAGNOSIS — C50311 Malignant neoplasm of lower-inner quadrant of right female breast: Secondary | ICD-10-CM | POA: Diagnosis not present

## 2019-07-10 DIAGNOSIS — Z170 Estrogen receptor positive status [ER+]: Secondary | ICD-10-CM | POA: Diagnosis not present

## 2019-07-10 MED ORDER — PEGFILGRASTIM-CBQV 6 MG/0.6ML ~~LOC~~ SOSY
6.0000 mg | PREFILLED_SYRINGE | Freq: Once | SUBCUTANEOUS | Status: AC
  Administered 2019-07-10: 6 mg via SUBCUTANEOUS

## 2019-07-14 ENCOUNTER — Telehealth: Payer: Self-pay | Admitting: Hematology and Oncology

## 2019-07-14 NOTE — Telephone Encounter (Signed)
Scheduled per 12/30 los, patient has been called and notified.

## 2019-07-16 ENCOUNTER — Other Ambulatory Visit: Payer: Self-pay

## 2019-07-16 ENCOUNTER — Telehealth: Payer: Self-pay | Admitting: *Deleted

## 2019-07-16 ENCOUNTER — Inpatient Hospital Stay (HOSPITAL_BASED_OUTPATIENT_CLINIC_OR_DEPARTMENT_OTHER): Payer: No Typology Code available for payment source | Admitting: Medical

## 2019-07-16 VITALS — BP 107/73 | HR 104 | Temp 98.2°F | Resp 19

## 2019-07-16 DIAGNOSIS — R110 Nausea: Secondary | ICD-10-CM | POA: Diagnosis not present

## 2019-07-16 DIAGNOSIS — Z5111 Encounter for antineoplastic chemotherapy: Secondary | ICD-10-CM | POA: Diagnosis not present

## 2019-07-16 DIAGNOSIS — C50311 Malignant neoplasm of lower-inner quadrant of right female breast: Secondary | ICD-10-CM

## 2019-07-16 DIAGNOSIS — S3992XA Unspecified injury of lower back, initial encounter: Secondary | ICD-10-CM

## 2019-07-16 DIAGNOSIS — K602 Anal fissure, unspecified: Secondary | ICD-10-CM

## 2019-07-16 DIAGNOSIS — Z95828 Presence of other vascular implants and grafts: Secondary | ICD-10-CM

## 2019-07-16 DIAGNOSIS — Z170 Estrogen receptor positive status [ER+]: Secondary | ICD-10-CM

## 2019-07-16 MED ORDER — PREDNISONE 5 MG PO TABS: ORAL_TABLET | ORAL | 0 refills | Status: DC

## 2019-07-16 MED ORDER — MORPHINE SULFATE (PF) 4 MG/ML IV SOLN
INTRAVENOUS | Status: AC
  Filled 2019-07-16: qty 1

## 2019-07-16 MED ORDER — SODIUM CHLORIDE 0.9% FLUSH
10.0000 mL | Freq: Once | INTRAVENOUS | Status: AC
  Administered 2019-07-16: 10 mL
  Filled 2019-07-16: qty 10

## 2019-07-16 MED ORDER — PROCHLORPERAZINE EDISYLATE 10 MG/2ML IJ SOLN
INTRAMUSCULAR | Status: AC
  Filled 2019-07-16: qty 2

## 2019-07-16 MED ORDER — SODIUM CHLORIDE 0.9 % IV SOLN
Freq: Once | INTRAVENOUS | Status: AC
  Filled 2019-07-16: qty 250

## 2019-07-16 MED ORDER — METHOCARBAMOL 500 MG PO TABS: 500.0000 mg | ORAL_TABLET | Freq: Four times a day (QID) | ORAL | 1 refills | Status: DC

## 2019-07-16 MED ORDER — PROCHLORPERAZINE EDISYLATE 10 MG/2ML IJ SOLN
10.0000 mg | Freq: Once | INTRAMUSCULAR | Status: AC
  Administered 2019-07-16: 17:00:00 10 mg via INTRAVENOUS

## 2019-07-16 MED ORDER — MORPHINE SULFATE 4 MG/ML IJ SOLN
2.0000 mg | Freq: Once | INTRAMUSCULAR | Status: AC
  Administered 2019-07-16: 17:00:00 2 mg via INTRAVENOUS
  Filled 2019-07-16: qty 1

## 2019-07-16 MED ORDER — HEPARIN SOD (PORK) LOCK FLUSH 100 UNIT/ML IV SOLN
500.0000 [IU] | Freq: Once | INTRAVENOUS | Status: AC
  Administered 2019-07-16: 500 [IU] via INTRAVENOUS
  Filled 2019-07-16: qty 5

## 2019-07-16 NOTE — Telephone Encounter (Signed)
Received call from pt husband Larkin Ina that pt is having severe back pain since this morning. Pt unable to walk or move on own. Pt reached for hot water for a sitz bath and "tweeked" her back. Pt also with fissure tear 2 days ago. Spoke to [place] PA, pt to come to [facility]. Informed pt, will come in now from home. Pt requesting wheelchair on arrival.

## 2019-07-16 NOTE — Patient Instructions (Signed)
Constipation Management  Magnesium Citrate, drink 1/2 bottle, drink remainder if no bowel movement with 30 to 60 minutes  Or  30 mg (1 tablespoon) of Milk of Magnesia in 8 ounces of prune juice, warm in microwave for 20 seconds    Begin the following after you have had a bowel movement:  Senna-S, 1 to 2 tablets twice daily  MiraLAX 17 grams in 8 ounces of liquids 1 to 2 times daily as needed   Remember to remain well hydrated. Drink, Drink, Drink non-caffeinated beverages.   Adjust these medications based on your response. If your bowel movements become too loose then decrease the amount of Senna-S and/or MiraLAX that you are using. If your bowel movements become too firm or are difficult to pass, the increase the amount of Senna-S and/or MiraLAX that you are using and increase your intake of water.   NEVER, NEVER, NEVER use an enema or suppositories unless your provider has given their approval.                   

## 2019-07-17 ENCOUNTER — Telehealth: Payer: Self-pay | Admitting: Medical

## 2019-07-17 NOTE — Telephone Encounter (Signed)
Kimberly Mueller reports that she is feeling significantly better this afternoon and is up and walking around her home with significant improvement in her pain.

## 2019-07-17 NOTE — Progress Notes (Signed)
Symptoms Management Clinic Progress Note   Misaki Burrowes [id number] [birth year] 34 y.o.  Annaelizabeth Raisbeck is managed by Dr. Nicholas Lose  Actively treated with chemotherapy/immunotherapy/hormonal therapy: yes  Current therapy: Cytoxan and Adriamycin with Udenyca support.  Last treated: 07/08/2019 (cycle 3, day 1)  Next scheduled appointment with provider: 07/22/2019 Assessment: Plan:    Nausea without vomiting - Plan: 0.9 %  sodium chloride infusion, prochlorperazine (COMPAZINE) injection 10 mg, prochlorperazine (COMPAZINE) 10 MG/2ML injection  Injury of back, initial encounter - Plan: morphine 4 MG/ML injection 2 mg, morphine 4 MG/ML injection, predniSONE (DELTASONE) 5 MG tablet, methocarbamol (ROBAXIN) 500 MG tablet  Port-A-Cath in place - Plan: sodium chloride flush (NS) 0.9 % injection 10 mL, heparin lock flush 100 unit/mL  Malignant neoplasm of lower-inner quadrant of right breast of female, estrogen receptor positive (HCC)  Anal fissure   Nausea: The patient was given Compazine 10 mg IV x1  Back injury suspected secondary to muscle spasms: Ms. Dow was given morphine 2 mg IV x1 and placed on Robaxin 500 mg p.o. 3 times daily which she will begin today and a prednisone taper which she will begin tomorrow.  It was discussed today that she may need to be referred for further imaging studies should she continue to have numbness in her bilateral lower extremities after her injury today.  She was told that she could use heat for 20 minutes on and 20 minutes off as needed.  ER positive malignant neoplasm of the right breast: Ms. Seabourn continues to be managed by Dr. Lindi Adie and is status post cycle 3, day 1 of Cytoxan and Adriamycin with Udenyca support which was dosed on 07/08/2019.  She is scheduled to return for follow-up on 07/22/2019.  History of anal fissure aggravated by constipation: She was given information regarding stool softeners and MiraLAX with educational material  given regarding their dosing.  Please see After Visit Summary for patient specific instructions.  Future Appointments  Date Time Provider [place]  07/22/2019  9:30 AM [facility] LAB 1 [facility] None  07/22/2019  9:45 AM [facility] [place] FLUSH [facility] None  07/22/2019 10:15 AM Nicholas Lose, MD [facility] None  07/22/2019 11:00 AM [facility] INFUSION [facility] None  07/22/2019 12:00 PM Jennet Maduro, RD [facility] None  07/24/2019 10:15 AM [facility] [place] FLUSH [facility] None  08/05/2019  8:45 AM [facility] LAB 3 [facility] None  08/05/2019  9:00 AM [facility] [place] FLUSH [facility] None  08/05/2019  9:30 AM Nicholas Lose, MD [facility] None  08/05/2019 10:45 AM [facility] INFUSION [facility] None  08/11/2019 11:00 AM [facility] LAB 1 [facility] None  08/11/2019 11:15 AM [facility] [place] FLUSH [facility] None  08/11/2019 11:45 AM Nicholas Lose, MD [facility] None  08/11/2019  1:00 PM [facility] INFUSION [facility] None  08/19/2019 10:00 AM [facility] LAB 6 [facility] None  08/19/2019 10:15 AM [facility] FLUSH [facility] None  08/19/2019 11:00 AM [facility] INFUSION [facility] None  08/25/2019  9:15 AM [facility] LAB 6 [facility] None  08/25/2019  9:30 AM [facility] [place] FLUSH [facility] None  08/25/2019 10:00 AM Nicholas Lose, MD [facility] None  08/25/2019 11:00 AM [facility] INFUSION [facility] None  09/02/2019  8:45 AM [facility] LAB 6 [facility] None  09/02/2019  9:00 AM [facility] [place] FLUSH [facility] None  09/02/2019 10:00 AM [facility] INFUSION [facility] None  09/09/2019  1:00 PM [facility] LAB 5 [facility] None  09/09/2019  1:15 PM [facility] [place] FLUSH [facility] None  09/09/2019  1:45 PM Nicholas Lose, MD [facility] None  09/09/2019  2:30 PM [facility] INFUSION [facility] None  09/16/2019  8:45 AM [facility] LAB 6 [facility] None  09/16/2019  9:00 AM [facility] [place] FLUSH [facility] None  09/16/2019 10:00 AM [facility] INFUSION [facility] None    No orders of the defined types  were placed in this encounter.      Subjective:   Patient ID:  Amiera Gratz is a 34 y.o. (DOB 08/10/1985) female.  Chief Complaint:  Chief Complaint  Patient presents with  . Back Pain    HPI Willistine Crider  Is a 34 y.o. female with a diagnosis of an ER positive malignant neoplasm of the right breast.  She is managed by Dr. Lindi Adie and is status post cycle 3, day 1 of Cytoxan and Adriamycin with Udenyca support which was dosed on 07/08/2019.  She has a history of anal fissure which was exacerbated by constipation.  She was using a sitz bath this morning when she reopened her anal fissure injured her lower back and the sitz bath.  She now cannot sit up or straighten.  She reports having some bilateral numbness in her lower extremities since this occurred.  She took 2 Tylenol at 9 AM and OxyContin at 2:30 PM.  She has fatigue and weakness.  Medications: I have reviewed the patient's current medications.  Allergies: No Known Allergies  Past Medical History:  Diagnosis Date  . Cancer [facility])    breast  . Family history of pancreatic cancer   . Family history of prostate cancer     Past Surgical History:  Procedure Laterality Date  . GASTRIC BYPASS    . HERNIA REPAIR    . PORTACATH PLACEMENT Right 06/09/2019   Procedure: INSERTION PORT-A-CATH WITH ULTRASOUND;  Surgeon: Rolm Bookbinder, MD;  Location: [facility];  Service: General;  Laterality: Right;    Family History  Problem Relation Age of Onset  . Prostate cancer Father   . Pancreatic cancer Paternal Grandmother     Social History   Socioeconomic History  . Marital status: Married    Spouse name: Not on file  . Number of children: Not on file  . Years of education: Not on file  . Highest education level: Not on file  Occupational History  . Not on file  Tobacco Use  . Smoking status: Former Research scientist (life sciences)  . Smokeless tobacco: Never Used  Substance and Sexual Activity  . Alcohol use: Never  . Drug  use: Never  . Sexual activity: Not on file  Other Topics Concern  . Not on file  Social History Narrative  . Not on file   Social Determinants of Health   Financial Resource Strain:   . Difficulty of Paying Living Expenses: Not on file  Food Insecurity:   . Worried About Charity fundraiser in the Last Year: Not on file  . Ran Out of Food in the Last Year: Not on file  Transportation Needs:   . Lack of Transportation (Medical): Not on file  . Lack of Transportation (Non-Medical): Not on file  Physical Activity:   . Days of Exercise per Week: Not on file  . Minutes of Exercise per Session: Not on file  Stress:   . Feeling of Stress : Not on file  Social Connections:   . Frequency of Communication with Friends and Family: Not on file  . Frequency of Social Gatherings with Friends and Family: Not on file  . Attends Religious Services: Not on file  . Active Member of Clubs or Organizations: Not on file  . Attends Club  or Organization Meetings: Not on file  . Marital Status: Not on file  Intimate Partner Violence:   . Fear of Current or Ex-Partner: Not on file  . Emotionally Abused: Not on file  . Physically Abused: Not on file  . Sexually Abused: Not on file    Past Medical History, Surgical history, Social history, and Family history were reviewed and updated as appropriate.   Please see review of systems for further details on the patient's review from today.   Review of Systems:  Review of Systems  Constitutional: Positive for activity change.  Gastrointestinal: Positive for constipation and rectal pain.  Musculoskeletal: Positive for back pain and gait problem.  Neurological: Positive for numbness.    Objective:   Physical Exam:  BP 107/73 (BP Location: Left Arm, Patient Position: Sitting)   Pulse (!) 104   Temp 98.2 F (36.8 C) (Temporal)   Resp 19   SpO2 100%  ECOG: 0  Physical Exam Constitutional:      General: She is not in acute distress.     Appearance: She is not ill-appearing.     Comments: The patient is an adult female who appears to be in a moderate amount of discomfort.  HENT:     Head: Normocephalic and atraumatic.  Cardiovascular:     Rate and Rhythm: Regular rhythm. Tachycardia present.     Heart sounds: No murmur. No friction rub.  Pulmonary:     Effort: Pulmonary effort is normal. No respiratory distress.     Breath sounds: Normal breath sounds. No wheezing.  Musculoskeletal:       Back:  Neurological:     Gait: Gait abnormal (The patient is ambulating with the use of a wheelchair.).  Psychiatric:        Mood and Affect: Mood normal.        Behavior: Behavior normal.        Thought Content: Thought content normal.        Judgment: Judgment normal.     Lab Review:     Component Value Date/Time   NA 137 07/08/2019 0800   K 3.4 (L) 07/08/2019 0800   CL 104 07/08/2019 0800   CO2 22 07/08/2019 0800   GLUCOSE 89 07/08/2019 0800   BUN 5 (L) 07/08/2019 0800   CREATININE 0.65 07/08/2019 0800   CALCIUM 8.8 (L) 07/08/2019 0800   PROT 6.5 07/08/2019 0800   ALBUMIN 3.9 07/08/2019 0800   AST 22 07/08/2019 0800   ALT 23 07/08/2019 0800   ALKPHOS 67 07/08/2019 0800   BILITOT 0.4 07/08/2019 0800   GFRNONAA >60 07/08/2019 0800   GFRAA >60 07/08/2019 0800       Component Value Date/Time   WBC 4.3 07/08/2019 0800   WBC 3.3 (L) 06/22/2019 1135   RBC 4.03 07/08/2019 0800   HGB 11.7 (L) 07/08/2019 0800   HCT 34.1 (L) 07/08/2019 0800   PLT 205 07/08/2019 0800   MCV 84.6 07/08/2019 0800   MCH 29.0 07/08/2019 0800   MCHC 34.3 07/08/2019 0800   RDW 13.2 07/08/2019 0800   LYMPHSABS 0.5 (L) 07/08/2019 0800   MONOABS 0.6 07/08/2019 0800   EOSABS 0.0 07/08/2019 0800   BASOSABS 0.1 07/08/2019 0800   -------------------------------  Imaging from last 24 hours (if applicable):  Radiology interpretation: MR FEMUR LEFT W WO CONTRAST  Result Date: 07/02/2019 CLINICAL DATA:  History of metastatic breast cancer.  History of metastatic breast cancer. EXAM: MR OF THE LEFT LOWER EXTREMITY WITHOUT AND WITH CONTRAST  TECHNIQUE: Multiplanar, multisequence MR imaging of the left femur was performed both before and after administration of intravenous contrast. CONTRAST:  7 mL GADAVIST IV COMPARISON:  Whole-body bone scan 06/12/2019. Plain films left femur 06/17/2019. FINDINGS: Bones/Joint/Cartilage There is no evidence of metastatic disease or worrisome marrow lesion. A tiny focus of mildly increased T2 and decreased T1 signal in the diaphysis of the distal femur could be a tiny enchondroma or focus active marrow. The patient also has a well-marginated T2 hyperintense lesion in the medial metaphysis of the right femur which is visualized in the coronal plane only. This may be a bone cyst or old nonossifying fibroma. There is no fracture or other acute abnormality. Ligaments Negative. Muscles and Tendons Negative. Soft tissues Negative. IMPRESSION: Negative for metastatic disease or acute abnormality. Focus on bone scan likely represents a tiny enchondroma or focus of active marrow. No follow-up imaging is recommended. Electronically Signed   By: Inge Rise M.D.   On: 07/02/2019 12:04   US BIOPSY (LIVER)  Result Date: 06/22/2019 INDICATION: 34 year old female with right-sided breast cancer and a hepatic lesion which may represent an adenoma or breast cancer metastasis based on MRI imaging. She presents for ultrasound-guided core biopsy to confirm the tissue diagnosis. EXAM: ULTRASOUND BIOPSY CORE LIVER MEDICATIONS: None. ANESTHESIA/SEDATION: Moderate (conscious) sedation was employed during this procedure. A total of Versed 4 mg and Fentanyl 50 mcg was administered intravenously. Moderate Sedation Time: 30 minutes. The patient's level of consciousness and vital signs were monitored continuously by radiology nursing throughout the procedure under my direct supervision. FLUOROSCOPY TIME:  None. COMPLICATIONS: None immediate.  PROCEDURE: Informed written consent was obtained from the patient after a thorough discussion of the procedural risks, benefits and alternatives. All questions were addressed. Maximal Sterile Barrier Technique was utilized including caps, mask, sterile gowns, sterile gloves, sterile drape, hand hygiene and skin antiseptic. A timeout was performed prior to the initiation of the procedure. Ultrasound was used to interrogate the left lobe of the liver. The left hemi liver is high riding and lies posterior to the xiphoid process. With the patient performing a deep inspiration, the left lobe of the liver descended sufficiently for adequate visualization. There is a lesion in the left hemi liver superior and medial however it is very nearly occult by sonographic imaging and is visible only as a slight distortion in the normal hepatic architecture with a curved margin. A suitable skin entry site was selected and marked. The region was sterilely prepped and draped in the standard fashion using chlorhexidine skin prep. Local anesthesia was attained by infiltration with 1% lidocaine. A small dermatotomy was made. Under real-time sonographic guidance and using intermittent deep inspiration, a 17 gauge introducer needle was carefully advanced through the left hemi liver and positioned at the margin of the mass. Several 18 gauge core biopsies were then coaxially obtained using the bio Pince automated biopsy device. Real-time ultrasound imaging was used to confirm needle location on all biopsies. Biopsy specimens were placed in formalin and delivered to pathology for further analysis. As the introducer needle was removed, the biopsy tract was embolized with a Gel-Foam slurry. The patient tolerated the procedure well. IMPRESSION: 1. The lesion is nearly invisible sonographically. 2. Technically successful ultrasound-guided core biopsy of the difficult to see lesion in hepatic segment 2. Electronically Signed   By: Jacqulynn Cadet M.D.   On: 06/22/2019 15:04

## 2019-07-21 ENCOUNTER — Encounter: Payer: Self-pay | Admitting: *Deleted

## 2019-07-21 NOTE — Progress Notes (Signed)
Patient Care Team: Jilda Panda, MD as PCP - General (Internal Medicine) Nicholas Lose, MD as Consulting Physician (Hematology and Oncology) Rolm Bookbinder, MD as Consulting Physician (General Surgery) Gery Pray, MD as Consulting Physician (Radiation Oncology) Rockwell Germany, RN as Oncology Nurse Navigator Mauro Kaufmann, RN as Oncology Nurse Navigator  DIAGNOSIS:    ICD-10-CM   1. Malignant neoplasm of lower-inner quadrant of right breast of female, estrogen receptor positive ([place])  C50.311    Z17.0     SUMMARY OF ONCOLOGIC HISTORY: Oncology History  Malignant neoplasm of lower-inner quadrant of right breast of female, estrogen receptor positive ([place])  05/25/2019 Initial Diagnosis   Patient palpated a right breast lump for two years. Diagnostic mammogram showed a 4.5cm right breast mass at the 6 o'clock position involving the overlying skin, 2 benign appearing left breast mass at the 10 o'clock position measuring 2.4cm and 2.1cm, and no axillary adenopathy. Biopsy showed: in the left breast, fibroadenoma and no evidence of malignancy, and in the right breast, IDC with DCIS, grade 3.  ER 70%, PR 30%, Ki-67 40%, HER-2 negative by [facility]    06/10/2019 -  Chemotherapy   The patient had DOXOrubicin (ADRIAMYCIN) chemo injection 118 mg, 60 mg/m2 = 118 mg, Intravenous,  Once, 3 of 4 cycles Dose modification: 50 mg/m2 (original dose 60 mg/m2, Cycle 2, Reason: Dose not tolerated) Administration: 118 mg (06/10/2019), 98 mg (06/24/2019), 98 mg (07/08/2019) palonosetron (ALOXI) injection 0.25 mg, 0.25 mg, Intravenous,  Once, 3 of 4 cycles Administration: 0.25 mg (06/10/2019), 0.25 mg (06/24/2019), 0.25 mg (07/08/2019) pegfilgrastim-cbqv (UDENYCA) injection 6 mg, 6 mg, Subcutaneous, Once, 3 of 4 cycles Administration: 6 mg (06/12/2019), 6 mg (06/26/2019), 6 mg (07/10/2019) cyclophosphamide (CYTOXAN) 1,180 mg in sodium chloride 0.9 % 250 mL chemo infusion, 600 mg/m2 = 1,180 mg, Intravenous,   Once, 3 of 4 cycles Dose modification: 500 mg/m2 (original dose 600 mg/m2, Cycle 2, Reason: Dose not tolerated) Administration: 1,180 mg (06/10/2019), 980 mg (06/24/2019), 980 mg (07/08/2019) PACLitaxel (TAXOL) 156 mg in sodium chloride 0.9 % 250 mL chemo infusion (</= 45m/m2), 80 mg/m2 = 156 mg, Intravenous,  Once, 0 of 12 cycles fosaprepitant (EMEND) 150 mg, dexamethasone (DECADRON) 12 mg in sodium chloride 0.9 % 145 mL IVPB, , Intravenous,  Once, 3 of 4 cycles Administration:  (06/10/2019),  (06/24/2019),  (07/08/2019)  for chemotherapy treatment.     Genetic Testing   No pathogenic variants identified. VUS in DDriggscalled c.2378A>G identified on the [facility] Common Hereditary Cancers Panel. The report date is 06/15/2019.  The Common Hereditary Cancers Panel offered by [facility] includes sequencing and/or deletion duplication testing of the following 48 genes: APC, ATM, AXIN2, BARD1, BMPR1A, BRCA1, BRCA2, BRIP1, CDH1, CDKN2A (p14ARF), CDKN2A (p16INK4a), CKD4, CHEK2, CTNNA1, DICER1, EPCAM (Deletion/duplication testing only), GREM1 (promoter region deletion/duplication testing only), KIT, MEN1, MLH1, MSH2, MSH3, MSH6, MUTYH, NBN, NF1, NHTL1, PALB2, PDGFRA, PMS2, POLD1, POLE, PTEN, RAD50, RAD51C, RAD51D, RNF43, SDHB, SDHC, SDHD, SMAD4, SMARCA4. STK11, TP53, TSC1, TSC2, and VHL.  The following genes were evaluated for sequence changes only: SDHA and HOXB13 c.251G>A variant only.     CHIEF COMPLIANT: Cycle4Adriamycin and Cytoxan  INTERVAL HISTORY: BWayne Brunkeris a 34y.o. with above-mentioned history of right breast cancer currently on neoadjuvant chemotherapy with dose dense Adriamycin and Cytoxan. She presents to the clinic todayfor cycle4.    ALLERGIES:  has No Known Allergies.  MEDICATIONS:  Current Outpatient Medications  Medication Sig Dispense Refill  . Cyanocobalamin (B-12 COMPLIANCE INJECTION) 1000 MCG/ML KIT  Inject as directed.    Marland Kitchen dexamethasone (DECADRON) 4 MG tablet Take 1  tablet (4 mg total) by mouth daily. Take 1 tablet day after chemo and 1 tablet 2 days after chemo with food 8 tablet 0  . etonogestrel (NEXPLANON) 68 MG IMPL implant 1 each by Subdermal route once.    . lidocaine-prilocaine (EMLA) cream Apply to affected area once 30 g 3  . LORazepam (ATIVAN) 0.5 MG tablet Take 1 tablet (0.5 mg total) by mouth every 6 (six) hours as needed (Nausea or vomiting). 30 tablet 0  . methocarbamol (ROBAXIN) 500 MG tablet Take 1 tablet (500 mg total) by mouth 4 (four) times daily. 40 tablet 1  . Multiple Vitamin (MULTIVITAMIN) tablet Take 1 tablet by mouth daily.    . ondansetron (ZOFRAN) 8 MG tablet Take 1 tablet (8 mg total) by mouth 2 (two) times daily as needed. Start on the third day after chemotherapy. 30 tablet 1  . predniSONE (DELTASONE) 5 MG tablet 6 tab x 1 day, 5 tab x 1 day, 4 tab x 1 day, 3 tab x 1 day, 2 tab x 1 day, 1 tab x 1 day, stop 21 tablet 0  . prochlorperazine (COMPAZINE) 10 MG tablet Take 1 tablet (10 mg total) by mouth every 6 (six) hours as needed (Nausea or vomiting). 30 tablet 1  . vortioxetine HBr (TRINTELLIX) 5 MG TABS tablet Take 5 mg by mouth daily.     No current facility-administered medications for this visit.    PHYSICAL EXAMINATION: ECOG PERFORMANCE STATUS: 1 - Symptomatic but completely ambulatory  Vitals:   07/22/19 1027  BP: 113/71  Pulse: 85  Resp: 18  Temp: 98.9 F (37.2 C)  SpO2: 99%   Filed Weights   07/22/19 1027  Weight: 158 lb 3.2 oz (71.8 kg)    LABORATORY DATA:  I have reviewed the data as listed CMP Latest Ref Rng & Units 07/22/2019 07/08/2019 06/24/2019  Glucose 70 - 99 mg/dL 85 89 92  BUN 6 - 20 mg/dL 8 5(L) 7  Creatinine 0.44 - 1.00 mg/dL 0.71 0.65 0.65  Sodium 135 - 145 mmol/L 140 137 139  Potassium 3.5 - 5.1 mmol/L 3.2(L) 3.4(L) 3.8  Chloride 98 - 111 mmol/L 106 104 105  CO2 22 - 32 mmol/L '22 22 23  ' Calcium 8.9 - 10.3 mg/dL 9.1 8.8(L) 9.0  Total Protein 6.5 - 8.1 g/dL 6.7 6.5 6.6  Total Bilirubin  0.3 - 1.2 mg/dL 0.5 0.4 0.5  Alkaline Phos 38 - 126 U/L 97 67 75  AST 15 - 41 U/L '27 22 29  ' ALT 0 - 44 U/L 80(H) 23 33    Lab Results  Component Value Date   WBC 7.2 07/22/2019   HGB 11.2 (L) 07/22/2019   HCT 32.8 (L) 07/22/2019   MCV 86.3 07/22/2019   PLT 240 07/22/2019   NEUTROABS 4.4 07/22/2019    ASSESSMENT & PLAN:  Malignant neoplasm of lower-inner quadrant of right breast of female, estrogen receptor positive ([place]) 05/15/2019:Patient palpated a right breast lump for two years. Diagnostic mammogram showed a 4.5cm right breast mass at the 6 o'clock position involving the overlying skin, 2 benign appearing left breast mass at the 10 o'clock position measuring 2.4cm and 2.1cm, and no axillary adenopathy. 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%, HER-2 negative ratio 1.41 T2 N0 stage IIa clinical stage  Treatment  plan: 1.Neoadjuvant chemotherapywith dose dense Adriamycin and Cytoxan x4 followed by Taxol x12starting 06/10/2019 2.Breast conserving surgery with sentinel lymph node biopsy 3.Adjuvant radiation 4.Follow-up adjuvant antiestrogen therapy (patient may be opting for oophorectomy plus anastrozole) ----------------------------------------------------------------------------------------------------------------------------------------------- Current treatment:Cycle4dose dense Adriamycin and Cytoxan Echocardiogram: 06/05/2019: EF 55 to 60% normal LV function  Chemo toxicities: 1.Fatigue: Being monitored 2.Leukopenia/neutropenia: Stable 3.  Constipation: Improving 4. ALT: 80: Monitoring closely. continue with the current treatment. 5. Hypokalemia: I prescribed potassium 20 mEq daily.  I suspect that is the cause of her back spasms  ultrasound-guided liver biopsy: Benign X-ray left femur: Corresponding to the site of uptake on bone scan is an  ill-defined sclerotic focus in the distal femoral diaphysis. It supports a benign etiology 07/02/2019 MRI left femur: Negative for metastatic disease likely tiny enchondroma or focus of active marrow  Return to clinic in2 weeks for cycle 1 Taxol    No orders of the defined types were placed in this encounter.  The patient has a good understanding of the overall plan. she agrees with it. she will call with any problems that may develop before the next visit here.  Total time spent: 30 mins including face to face time and time spent for planning, charting and coordination of care  Nicholas Lose, MD 07/22/2019  I, Cloyde Reams Dorshimer, am acting as scribe for Dr. Nicholas Lose.  I have reviewed the above documentation for accuracy and completeness, and I agree with the above.

## 2019-07-22 ENCOUNTER — Inpatient Hospital Stay: Payer: No Typology Code available for payment source

## 2019-07-22 ENCOUNTER — Other Ambulatory Visit: Payer: Self-pay

## 2019-07-22 ENCOUNTER — Inpatient Hospital Stay (HOSPITAL_BASED_OUTPATIENT_CLINIC_OR_DEPARTMENT_OTHER): Payer: No Typology Code available for payment source | Admitting: Hematology and Oncology

## 2019-07-22 DIAGNOSIS — Z170 Estrogen receptor positive status [ER+]: Secondary | ICD-10-CM

## 2019-07-22 DIAGNOSIS — Z95828 Presence of other vascular implants and grafts: Secondary | ICD-10-CM | POA: Insufficient documentation

## 2019-07-22 DIAGNOSIS — C50311 Malignant neoplasm of lower-inner quadrant of right female breast: Secondary | ICD-10-CM | POA: Diagnosis not present

## 2019-07-22 DIAGNOSIS — Z5111 Encounter for antineoplastic chemotherapy: Secondary | ICD-10-CM | POA: Diagnosis not present

## 2019-07-22 LAB — CMP (CANCER CENTER ONLY)
ALT: 80 U/L — ABNORMAL HIGH (ref 0–44)
AST: 27 U/L (ref 15–41)
Albumin: 4 g/dL (ref 3.5–5.0)
Alkaline Phosphatase: 97 U/L (ref 38–126)
Anion gap: 12 (ref 5–15)
BUN: 8 mg/dL (ref 6–20)
CO2: 22 mmol/L (ref 22–32)
Calcium: 9.1 mg/dL (ref 8.9–10.3)
Chloride: 106 mmol/L (ref 98–111)
Creatinine: 0.71 mg/dL (ref 0.44–1.00)
GFR, Est AFR Am: >60 mL/min (ref >60)
GFR, Estimated: >60 mL/min (ref >60)
Glucose, Bld: 85 mg/dL (ref 70–99)
Potassium: 3.2 mmol/L — ABNORMAL LOW (ref 3.5–5.1)
Sodium: 140 mmol/L (ref 135–145)
Total Bilirubin: 0.5 mg/dL (ref 0.3–1.2)
Total Protein: 6.7 g/dL (ref 6.5–8.1)

## 2019-07-22 LAB — CBC WITH DIFFERENTIAL (CANCER CENTER ONLY)
Abs Immature Granulocytes: 0.8 10*3/uL — ABNORMAL HIGH (ref 0.00–0.07)
Basophils Absolute: 0.1 10*3/uL (ref 0.0–0.1)
Basophils Relative: 1 %
Eosinophils Absolute: 0 10*3/uL (ref 0.0–0.5)
Eosinophils Relative: 0 %
HCT: 32.8 % — ABNORMAL LOW (ref 36.0–46.0)
Hemoglobin: 11.2 g/dL — ABNORMAL LOW (ref 12.0–15.0)
Immature Granulocytes: 11 %
Lymphocytes Relative: 13 %
Lymphs Abs: 1 10*3/uL (ref 0.7–4.0)
MCH: 29.5 pg (ref 26.0–34.0)
MCHC: 34.1 g/dL (ref 30.0–36.0)
MCV: 86.3 fL (ref 80.0–100.0)
Monocytes Absolute: 1 10*3/uL (ref 0.1–1.0)
Monocytes Relative: 13 %
Neutro Abs: 4.4 10*3/uL (ref 1.7–7.7)
Neutrophils Relative %: 62 %
Platelet Count: 240 10*3/uL (ref 150–400)
RBC: 3.8 MIL/uL — ABNORMAL LOW (ref 3.87–5.11)
RDW: 15.9 % — ABNORMAL HIGH (ref 11.5–15.5)
WBC Count: 7.2 10*3/uL (ref 4.0–10.5)
nRBC: 0.7 % — ABNORMAL HIGH (ref 0.0–0.2)

## 2019-07-22 LAB — PREGNANCY, URINE: Preg Test, Ur: NEGATIVE

## 2019-07-22 MED ORDER — HEPARIN SOD (PORK) LOCK FLUSH 100 UNIT/ML IV SOLN
500.0000 [IU] | Freq: Once | INTRAVENOUS | Status: AC | PRN
  Administered 2019-07-22: 500 [IU]
  Filled 2019-07-22: qty 5

## 2019-07-22 MED ORDER — SODIUM CHLORIDE 0.9 % IV SOLN
Freq: Once | INTRAVENOUS | Status: AC
  Filled 2019-07-22: qty 250

## 2019-07-22 MED ORDER — SODIUM CHLORIDE 0.9% FLUSH
10.0000 mL | INTRAVENOUS | Status: DC | PRN
  Administered 2019-07-22: 10 mL
  Filled 2019-07-22: qty 10

## 2019-07-22 MED ORDER — SODIUM CHLORIDE 0.9 % IV SOLN
Freq: Once | INTRAVENOUS | Status: AC
  Filled 2019-07-22: qty 5

## 2019-07-22 MED ORDER — POTASSIUM CHLORIDE CRYS ER 20 MEQ PO TBCR: 20.0000 meq | EXTENDED_RELEASE_TABLET | Freq: Two times a day (BID) | ORAL | 0 refills | Status: DC

## 2019-07-22 MED ORDER — PALONOSETRON HCL INJECTION 0.25 MG/5ML
INTRAVENOUS | Status: AC
  Filled 2019-07-22: qty 5

## 2019-07-22 MED ORDER — PALONOSETRON HCL INJECTION 0.25 MG/5ML
0.2500 mg | Freq: Once | INTRAVENOUS | Status: AC
  Administered 2019-07-22: 0.25 mg via INTRAVENOUS

## 2019-07-22 MED ORDER — SODIUM CHLORIDE 0.9 % IV SOLN
500.0000 mg/m2 | Freq: Once | INTRAVENOUS | Status: AC
  Administered 2019-07-22: 980 mg via INTRAVENOUS
  Filled 2019-07-22: qty 49

## 2019-07-22 MED ORDER — DOXORUBICIN HCL CHEMO IV INJECTION 2 MG/ML
50.0000 mg/m2 | Freq: Once | INTRAVENOUS | Status: AC
  Administered 2019-07-22: 98 mg via INTRAVENOUS
  Filled 2019-07-22: qty 49

## 2019-07-22 MED ORDER — SODIUM CHLORIDE 0.9% FLUSH
10.0000 mL | INTRAVENOUS | Status: DC | PRN
  Administered 2019-07-22: 10:00:00 10 mL
  Filled 2019-07-22: qty 10

## 2019-07-22 NOTE — Patient Instructions (Signed)
[facility] Discharge Instructions for Patients Receiving Chemotherapy  Today you received the following chemotherapy agents:  Adriamycin (Doxorubicin), Cytoxan (Cyclophosphamide)  To help prevent nausea and vomiting after your treatment, we encourage you to take your nausea medication as prescribed.   If you develop nausea and vomiting that is not controlled by your nausea medication, call the clinic.   BELOW ARE SYMPTOMS THAT SHOULD BE REPORTED IMMEDIATELY:  *FEVER GREATER THAN 100.5 F  *CHILLS WITH OR WITHOUT FEVER  NAUSEA AND VOMITING THAT IS NOT CONTROLLED WITH YOUR NAUSEA MEDICATION  *UNUSUAL SHORTNESS OF BREATH  *UNUSUAL BRUISING OR BLEEDING  TENDERNESS IN MOUTH AND THROAT WITH OR WITHOUT PRESENCE OF ULCERS  *URINARY PROBLEMS  *BOWEL PROBLEMS  UNUSUAL RASH Items with * indicate a potential emergency and should be followed up as soon as possible.  Feel free to call the clinic should you have any questions or concerns. The clinic phone number is [phone].  Please show the [facility] at check-in to the Emergency Department and triage nurse.

## 2019-07-22 NOTE — Assessment & Plan Note (Signed)
05/15/2019:Patient palpated a right breast lump for two years. Diagnostic mammogram showed a 4.5cm right breast mass at the 6 o'clock position involving the overlying skin, 2 benign appearing left breast mass at the 10 o'clock position measuring 2.4cm and 2.1cm, and no axillary adenopathy. 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%, HER-2 negative ratio 1.41 T2 N0 stage IIa clinical stage  Treatment plan: 1.Neoadjuvant chemotherapywith dose dense Adriamycin and Cytoxan x4 followed by Taxol x12starting 06/10/2019 2.Breast conserving surgery with sentinel lymph node biopsy 3.Adjuvant radiation 4.Follow-up adjuvant antiestrogen therapy (patient may be opting for oophorectomy plus anastrozole) ----------------------------------------------------------------------------------------------------------------------------------------------- Current treatment:Cycle4dose dense Adriamycin and Cytoxan Echocardiogram: 06/05/2019: EF 55 to 60% normal LV function  Chemo toxicities: 1.Fatigue: Being monitored 2.Leukopenia/neutropenia: Stable 3.  Constipation: Improving  ultrasound-guided liver biopsy: Benign X-ray left femur: Corresponding to the site of uptake on bone scan is an ill-defined sclerotic focus in the distal femoral diaphysis. It supports a benign etiology 07/02/2019 MRI left femur: Negative for metastatic disease likely tiny enchondroma or focus of active marrow  Return to clinic in2 weeks for cycle 1 Taxol

## 2019-07-22 NOTE — Progress Notes (Signed)
Nutrition Follow-up:  Patient with right breast cancer.  Followed by Dr. Lindi Adie and receiving chemotherapy.  [place]  Noted patient seen by Lucianne Lei, PA for back injury.    Met with patient during infusion.  Patient has been recording intake since last visit as well as bowel movements, exercise and mood.  Reports intake following this past treatment was down for longer than typical.  Intake of protein between 30-60 g per day.  Patient liberalizing diet. Eating greek yogurt, fruits, toast, soups, chicken and vegetables, and using genepro protein powders in foods.  Drinking water and Gatorade zero.  Has tried isopure but did not tolerate it.  Has trouble drinking premier protein the week after chemotherapy.     Patient reports issues with nausea but afraid to take too much nausea medication as will contribute to constipation.    Medications: miralax, colace, zofran, compazine  Labs: K 3.2  Anthropometrics:   Weight 158 lb 3.2 oz decreased from 166 lb on 07/08/2019   NUTRITION DIAGNOSIS: Unintentional weight loss continues   INTERVENTION:  Encouraged patient to continue daily journal of intake, exercise, etc.  Discussed importance of taking nausea medications.  Discussed importance following bowel regimen as well to prevent constipation.  Patient to continue to try and meet protein goal.  Patient to continue to liberalize diet to help prevent rapid weight loss.   Patient has contact information     MONITORING, EVALUATION, GOAL: Patient will continue to meet nutritional needs with gastric bypass and receiving treatment for breast cancer.    NEXT VISIT: Jan 27th during treatment  Kimberly Mueller B. Zenia Resides, [place], [place] Registered Dietitian [phone] (pager)

## 2019-07-24 ENCOUNTER — Inpatient Hospital Stay: Payer: No Typology Code available for payment source

## 2019-07-24 ENCOUNTER — Other Ambulatory Visit: Payer: Self-pay

## 2019-07-24 VITALS — BP 111/68 | HR 92 | Temp 99.0°F | Resp 18

## 2019-07-24 DIAGNOSIS — C50311 Malignant neoplasm of lower-inner quadrant of right female breast: Secondary | ICD-10-CM

## 2019-07-24 DIAGNOSIS — Z170 Estrogen receptor positive status [ER+]: Secondary | ICD-10-CM

## 2019-07-24 DIAGNOSIS — Z5111 Encounter for antineoplastic chemotherapy: Secondary | ICD-10-CM | POA: Diagnosis not present

## 2019-07-24 MED ORDER — PEGFILGRASTIM-CBQV 6 MG/0.6ML ~~LOC~~ SOSY
6.0000 mg | PREFILLED_SYRINGE | Freq: Once | SUBCUTANEOUS | Status: AC
  Administered 2019-07-24: 6 mg via SUBCUTANEOUS

## 2019-07-24 MED ORDER — PEGFILGRASTIM-CBQV 6 MG/0.6ML ~~LOC~~ SOSY
PREFILLED_SYRINGE | SUBCUTANEOUS | Status: AC
  Filled 2019-07-24: qty 0.6

## 2019-07-24 NOTE — Patient Instructions (Signed)

## 2019-07-29 ENCOUNTER — Other Ambulatory Visit: Payer: Self-pay | Admitting: Hematology and Oncology

## 2019-07-30 NOTE — Addendum Note (Signed)
Addended by: Tora Kindred on: 07/30/2019 08:22 AM   Modules accepted: Orders

## 2019-07-31 ENCOUNTER — Telehealth: Payer: Self-pay | Admitting: *Deleted

## 2019-07-31 ENCOUNTER — Other Ambulatory Visit: Payer: Self-pay | Admitting: Medical

## 2019-07-31 DIAGNOSIS — K6289 Other specified diseases of anus and rectum: Secondary | ICD-10-CM

## 2019-07-31 MED ORDER — LIDOCAINE VISCOUS HCL 2 % MT SOLN: OROMUCOSAL | 1 refills | Status: DC

## 2019-07-31 NOTE — Telephone Encounter (Signed)
Pt called with complaints of pain 10/10 with BM from tear in anal fissure. Pt taking stool softeners as directed per Lucianne Lei, sitz baths and using nifedipine cream x 4 daily. Gave pt recommendations per Lucianne Lei and use of OTC Nupricainal ointment.  Pt would like to try viscous lidocaine- informed Lucianne Lei of pt request. Pt also request to hold initiation of taxol treatment until anal fissures are healed. Msg sent to Dr. Lindi Adie regarding this.

## 2019-08-03 ENCOUNTER — Telehealth: Payer: Self-pay | Admitting: *Deleted

## 2019-08-03 NOTE — Telephone Encounter (Signed)
Per Dr. Lindi Adie pt may take 1-2 wk break before starting Taxol. Pt would like to cancel 1st taxol treatment. Appts cx, pt notified.

## 2019-08-05 ENCOUNTER — Inpatient Hospital Stay: Payer: No Typology Code available for payment source

## 2019-08-05 ENCOUNTER — Other Ambulatory Visit: Payer: Self-pay | Admitting: *Deleted

## 2019-08-05 ENCOUNTER — Inpatient Hospital Stay: Payer: No Typology Code available for payment source | Admitting: Hematology and Oncology

## 2019-08-05 MED ORDER — GABAPENTIN 100 MG PO CAPS: 100.0000 mg | ORAL_CAPSULE | Freq: Every day | ORAL | 3 refills | Status: DC

## 2019-08-11 ENCOUNTER — Ambulatory Visit: Payer: 59 | Admitting: Hematology and Oncology

## 2019-08-11 ENCOUNTER — Other Ambulatory Visit: Payer: 59

## 2019-08-11 ENCOUNTER — Ambulatory Visit: Payer: 59

## 2019-08-12 ENCOUNTER — Encounter: Payer: Self-pay | Admitting: *Deleted

## 2019-08-12 ENCOUNTER — Inpatient Hospital Stay: Payer: No Typology Code available for payment source

## 2019-08-12 ENCOUNTER — Other Ambulatory Visit: Payer: Self-pay

## 2019-08-12 ENCOUNTER — Inpatient Hospital Stay (HOSPITAL_BASED_OUTPATIENT_CLINIC_OR_DEPARTMENT_OTHER): Payer: No Typology Code available for payment source | Admitting: Adult Health

## 2019-08-12 ENCOUNTER — Other Ambulatory Visit: Payer: Self-pay | Admitting: Hematology and Oncology

## 2019-08-12 ENCOUNTER — Inpatient Hospital Stay: Payer: No Typology Code available for payment source | Attending: Hematology and Oncology

## 2019-08-12 VITALS — BP 106/70 | HR 88 | Temp 98.9°F | Resp 18

## 2019-08-12 DIAGNOSIS — Z800 Family history of malignant neoplasm of digestive organs: Secondary | ICD-10-CM | POA: Diagnosis not present

## 2019-08-12 DIAGNOSIS — R5383 Other fatigue: Secondary | ICD-10-CM | POA: Diagnosis not present

## 2019-08-12 DIAGNOSIS — Z95828 Presence of other vascular implants and grafts: Secondary | ICD-10-CM

## 2019-08-12 DIAGNOSIS — C50311 Malignant neoplasm of lower-inner quadrant of right female breast: Secondary | ICD-10-CM

## 2019-08-12 DIAGNOSIS — Z8042 Family history of malignant neoplasm of prostate: Secondary | ICD-10-CM | POA: Insufficient documentation

## 2019-08-12 DIAGNOSIS — G629 Polyneuropathy, unspecified: Secondary | ICD-10-CM | POA: Insufficient documentation

## 2019-08-12 DIAGNOSIS — C50511 Malignant neoplasm of lower-outer quadrant of right female breast: Secondary | ICD-10-CM | POA: Insufficient documentation

## 2019-08-12 DIAGNOSIS — Z5111 Encounter for antineoplastic chemotherapy: Secondary | ICD-10-CM | POA: Insufficient documentation

## 2019-08-12 DIAGNOSIS — Z803 Family history of malignant neoplasm of breast: Secondary | ICD-10-CM | POA: Diagnosis not present

## 2019-08-12 DIAGNOSIS — Z170 Estrogen receptor positive status [ER+]: Secondary | ICD-10-CM | POA: Diagnosis not present

## 2019-08-12 DIAGNOSIS — Z87891 Personal history of nicotine dependence: Secondary | ICD-10-CM | POA: Diagnosis not present

## 2019-08-12 DIAGNOSIS — K5900 Constipation, unspecified: Secondary | ICD-10-CM | POA: Insufficient documentation

## 2019-08-12 DIAGNOSIS — Z9884 Bariatric surgery status: Secondary | ICD-10-CM | POA: Diagnosis not present

## 2019-08-12 LAB — CBC WITH DIFFERENTIAL (CANCER CENTER ONLY)
Abs Immature Granulocytes: 0.03 10*3/uL (ref 0.00–0.07)
Basophils Absolute: 0 10*3/uL (ref 0.0–0.1)
Basophils Relative: 1 %
Eosinophils Absolute: 0 10*3/uL (ref 0.0–0.5)
Eosinophils Relative: 0 %
HCT: 33.2 % — ABNORMAL LOW (ref 36.0–46.0)
Hemoglobin: 11 g/dL — ABNORMAL LOW (ref 12.0–15.0)
Immature Granulocytes: 1 %
Lymphocytes Relative: 16 %
Lymphs Abs: 0.5 10*3/uL — ABNORMAL LOW (ref 0.7–4.0)
MCH: 30.4 pg (ref 26.0–34.0)
MCHC: 33.1 g/dL (ref 30.0–36.0)
MCV: 91.7 fL (ref 80.0–100.0)
Monocytes Absolute: 0.8 10*3/uL (ref 0.1–1.0)
Monocytes Relative: 27 %
Neutro Abs: 1.7 10*3/uL (ref 1.7–7.7)
Neutrophils Relative %: 55 %
Platelet Count: 393 10*3/uL (ref 150–400)
RBC: 3.62 MIL/uL — ABNORMAL LOW (ref 3.87–5.11)
RDW: 19.2 % — ABNORMAL HIGH (ref 11.5–15.5)
WBC Count: 3.1 10*3/uL — ABNORMAL LOW (ref 4.0–10.5)
nRBC: 0 % (ref 0.0–0.2)

## 2019-08-12 LAB — CMP (CANCER CENTER ONLY)
ALT: 60 U/L — ABNORMAL HIGH (ref 0–44)
AST: 50 U/L — ABNORMAL HIGH (ref 15–41)
Albumin: 3.7 g/dL (ref 3.5–5.0)
Alkaline Phosphatase: 76 U/L (ref 38–126)
Anion gap: 7 (ref 5–15)
BUN: 6 mg/dL (ref 6–20)
CO2: 25 mmol/L (ref 22–32)
Calcium: 9 mg/dL (ref 8.9–10.3)
Chloride: 107 mmol/L (ref 98–111)
Creatinine: 0.62 mg/dL (ref 0.44–1.00)
GFR, Est AFR Am: >60 mL/min (ref >60)
GFR, Estimated: >60 mL/min (ref >60)
Glucose, Bld: 85 mg/dL (ref 70–99)
Potassium: 4.1 mmol/L (ref 3.5–5.1)
Sodium: 139 mmol/L (ref 135–145)
Total Bilirubin: 0.7 mg/dL (ref 0.3–1.2)
Total Protein: 6.2 g/dL — ABNORMAL LOW (ref 6.5–8.1)

## 2019-08-12 LAB — PREGNANCY, URINE: Preg Test, Ur: NEGATIVE

## 2019-08-12 LAB — VITAMIN D 25 HYDROXY (VIT D DEFICIENCY, FRACTURES): Vit D, 25-Hydroxy: 39.89 ng/mL (ref 30–100)

## 2019-08-12 LAB — VITAMIN B12: Vitamin B-12: 3868 pg/mL — ABNORMAL HIGH (ref 180–914)

## 2019-08-12 LAB — FOLATE: Folate: 8.5 ng/mL (ref >5.9)

## 2019-08-12 LAB — MAGNESIUM: Magnesium: 1.8 mg/dL (ref 1.7–2.4)

## 2019-08-12 MED ORDER — DIPHENHYDRAMINE HCL 50 MG/ML IJ SOLN
25.0000 mg | Freq: Once | INTRAMUSCULAR | Status: AC
  Administered 2019-08-12: 25 mg via INTRAVENOUS

## 2019-08-12 MED ORDER — SODIUM CHLORIDE 0.9% FLUSH
10.0000 mL | INTRAVENOUS | Status: DC | PRN
  Administered 2019-08-12: 14:00:00 10 mL
  Filled 2019-08-12: qty 10

## 2019-08-12 MED ORDER — DIPHENHYDRAMINE HCL 50 MG/ML IJ SOLN
INTRAMUSCULAR | Status: AC
  Filled 2019-08-12: qty 1

## 2019-08-12 MED ORDER — HEPARIN SOD (PORK) LOCK FLUSH 100 UNIT/ML IV SOLN
500.0000 [IU] | Freq: Once | INTRAVENOUS | Status: AC | PRN
  Administered 2019-08-12: 14:00:00 500 [IU]
  Filled 2019-08-12: qty 5

## 2019-08-12 MED ORDER — SODIUM CHLORIDE 0.9 % IV SOLN
80.0000 mg/m2 | Freq: Once | INTRAVENOUS | Status: AC
  Administered 2019-08-12: 156 mg via INTRAVENOUS
  Filled 2019-08-12: qty 26

## 2019-08-12 MED ORDER — FAMOTIDINE IN NACL 20-0.9 MG/50ML-% IV SOLN
20.0000 mg | Freq: Once | INTRAVENOUS | Status: AC
  Administered 2019-08-12: 20 mg via INTRAVENOUS

## 2019-08-12 MED ORDER — FAMOTIDINE IN NACL 20-0.9 MG/50ML-% IV SOLN
INTRAVENOUS | Status: AC
  Filled 2019-08-12: qty 50

## 2019-08-12 MED ORDER — SODIUM CHLORIDE 0.9 % IV SOLN
Freq: Once | INTRAVENOUS | Status: AC
  Filled 2019-08-12: qty 250

## 2019-08-12 MED ORDER — SODIUM CHLORIDE 0.9% FLUSH
10.0000 mL | INTRAVENOUS | Status: DC | PRN
  Administered 2019-08-12: 10 mL
  Filled 2019-08-12: qty 10

## 2019-08-12 MED ORDER — DEXAMETHASONE SODIUM PHOSPHATE 10 MG/ML IJ SOLN
INTRAMUSCULAR | Status: AC
  Filled 2019-08-12: qty 1

## 2019-08-12 MED ORDER — DEXAMETHASONE SODIUM PHOSPHATE 10 MG/ML IJ SOLN
10.0000 mg | Freq: Once | INTRAMUSCULAR | Status: AC
  Administered 2019-08-12: 10 mg via INTRAVENOUS

## 2019-08-12 NOTE — Assessment & Plan Note (Addendum)
05/15/2019:Patient palpated a right breast lump for two years. Diagnostic mammogram showed a 4.5cm right breast mass at the 6 o'clock position involving the overlying skin, 2 benign appearing left breast mass at the 10 o'clock position measuring 2.4cm and 2.1cm, and no axillary adenopathy. 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    To note:   ultrasound-guided liver biopsy: Benign X-ray left femur: Corresponding to the site of uptake on bone scan is an ill-defined sclerotic focus in the distal femoral diaphysis. It supports a benign etiology 07/02/2019 MRI left femur: Negative for metastatic disease likely tiny enchondroma or focus of active marrow   Treatment plan: 1.Neoadjuvant chemotherapywith dose dense Adriamycin and Cytoxan x4 followed by Taxol x12starting 06/10/2019 2.Breast conserving surgery with sentinel lymph node biopsy 3.Adjuvant radiation 4.Follow-up adjuvant antiestrogen therapy (patient may be opting for oophorectomy plus anastrozole) ----------------------------------------------------------------------------------------------------------------------------------------------- Current treatment:Cycle 1 weekly Taxol  Reviewed risks and benefits of weekly taxol in detail.  Discussed peripheral  Neuropathy in detail.  Reviewed cryotherapy recommendations.  I recommended that she drink plenty of fluids and we also discussed her updated anti emetics.  She will take colace daily x 2 days after chemotherapy, then go to every other day.    She will return weekly for treatment, then in 2 weeks for f/u with [place].

## 2019-08-12 NOTE — Progress Notes (Signed)
[facility] Follow up:    Jilda Panda, MD [address] [place] [address]   DIAGNOSIS: Cancer Staging Malignant neoplasm of lower-inner quadrant of right breast of female, estrogen receptor positive ([place]) Staging form: Breast, AJCC 8th Edition - Clinical stage from 05/27/2019: Stage IIA (cT2, cN0, cM0, G3, ER+, PR+, HER2: Equivocal) - Unsigned   SUMMARY OF ONCOLOGIC HISTORY: Oncology History  Malignant neoplasm of lower-inner quadrant of right breast of female, estrogen receptor positive ([place])  05/25/2019 Initial Diagnosis   Patient palpated a right breast lump for two years. Diagnostic mammogram showed a 4.5cm right breast mass at the 6 o'clock position involving the overlying skin, 2 benign appearing left breast mass at the 10 o'clock position measuring 2.4cm and 2.1cm, and no axillary adenopathy. Biopsy showed: in the left breast, fibroadenoma and no evidence of malignancy, and in the right breast, IDC with DCIS, grade 3.  ER 70%, PR 30%, Ki-67 40%, HER-2 negative by [facility]    06/10/2019 -  Chemotherapy   The patient had DOXOrubicin (ADRIAMYCIN) chemo injection 118 mg, 60 mg/m2 = 118 mg, Intravenous,  Once, 4 of 4 cycles Dose modification: 50 mg/m2 (original dose 60 mg/m2, Cycle 2, Reason: Dose not tolerated) Administration: 118 mg (06/10/2019), 98 mg (06/24/2019), 98 mg (07/08/2019), 98 mg (07/22/2019) palonosetron (ALOXI) injection 0.25 mg, 0.25 mg, Intravenous,  Once, 4 of 4 cycles Administration: 0.25 mg (06/10/2019), 0.25 mg (06/24/2019), 0.25 mg (07/08/2019), 0.25 mg (07/22/2019) pegfilgrastim-cbqv (UDENYCA) injection 6 mg, 6 mg, Subcutaneous, Once, 4 of 4 cycles Administration: 6 mg (06/12/2019), 6 mg (06/26/2019), 6 mg (07/10/2019), 6 mg (07/24/2019) cyclophosphamide (CYTOXAN) 1,180 mg in sodium chloride 0.9 % 250 mL chemo infusion, 600 mg/m2 = 1,180 mg, Intravenous,  Once, 4 of 4 cycles Dose modification: 500 mg/m2 (original dose 600 mg/m2, Cycle 2, Reason:  Dose not tolerated) Administration: 1,180 mg (06/10/2019), 980 mg (06/24/2019), 980 mg (07/08/2019), 980 mg (07/22/2019) PACLitaxel (TAXOL) 156 mg in sodium chloride 0.9 % 250 mL chemo infusion (</= 56m/m2), 80 mg/m2 = 156 mg, Intravenous,  Once, 1 of 12 cycles fosaprepitant (EMEND) 150 mg, dexamethasone (DECADRON) 12 mg in sodium chloride 0.9 % 145 mL IVPB, , Intravenous,  Once, 4 of 4 cycles Administration:  (06/10/2019),  (06/24/2019),  (07/08/2019),  (07/22/2019)  for chemotherapy treatment.     Genetic Testing   No pathogenic variants identified. [place] c.[id number] identified on the [facility] Common Hereditary Cancers Panel. The report date is 06/15/2019.  The Common Hereditary Cancers Panel offered by [facility] includes sequencing and/or deletion duplication testing of the following 48 genes: APC, ATM, AXIN2, BARD1, BMPR1A, BRCA1, BRCA2, BRIP1, CDH1, CDKN2A (p14ARF), CDKN2A (p16INK4a), CKD4, CHEK2, CTNNA1, DICER1, EPCAM (Deletion/duplication testing only), GREM1 (promoter region deletion/duplication testing only), KIT, MEN1, MLH1, MSH2, MSH3, MSH6, MUTYH, NBN, NF1, NHTL1, PALB2, PDGFRA, PMS2, POLD1, POLE, PTEN, RAD50, RAD51C, RAD51D, RNF43, SDHB, SDHC, SDHD, SMAD4, SMARCA4. STK11, TP53, TSC1, TSC2, and VHL.  The following genes were evaluated for sequence changes only: SDHA and HOXB13 c.251G>A variant only.     CURRENT THERAPY: weekly Taxol  INTERVAL HISTORY: [name].[id number]. female returns for follow up prior to starting neoadjuvant Paclitaxel.  She is feeling well today.  She delayed chemotherapy by one week due to significant constipation that she had last week complicated by an anal fissure.  She is now having regular bowel movements.  She is taking a stool softener every other day.  She also has an odd numbness in her calves and  abdomen.  She started on Gabapentin at night.  She hasn't noted an improvement however.     Patient Active Problem List   Diagnosis Date Noted  .  Port-A-Cath in place 07/22/2019  . Genetic testing 06/15/2019  . Family history of prostate cancer   . Family history of pancreatic cancer   . Malignant neoplasm of lower-inner quadrant of right breast of female, estrogen receptor positive ([place]) 05/25/2019    has No Known Allergies.  MEDICAL HISTORY: Past Medical History:  Diagnosis Date  . Cancer [facility])    breast  . Family history of pancreatic cancer   . Family history of prostate cancer     SURGICAL HISTORY: Past Surgical History:  Procedure Laterality Date  . GASTRIC BYPASS    . HERNIA REPAIR    . PORTACATH PLACEMENT Right 06/09/2019   Procedure: INSERTION PORT-A-CATH WITH ULTRASOUND;  Surgeon: Rolm Bookbinder, MD;  Location: [facility];  Service: General;  Laterality: Right;    SOCIAL HISTORY: Social History   Socioeconomic History  . Marital status: Married    Spouse name: Not on file  . Number of children: Not on file  . Years of education: Not on file  . Highest education level: Not on file  Occupational History  . Not on file  Tobacco Use  . Smoking status: Former Research scientist (life sciences)  . Smokeless tobacco: Never Used  Substance and Sexual Activity  . Alcohol use: Never  . Drug use: Never  . Sexual activity: Not on file  Other Topics Concern  . Not on file  Social History Narrative  . Not on file   Social Determinants of Health   Financial Resource Strain:   . Difficulty of Paying Living Expenses: Not on file  Food Insecurity:   . Worried About Charity fundraiser in the Last Year: Not on file  . Ran Out of Food in the Last Year: Not on file  Transportation Needs:   . Lack of Transportation (Medical): Not on file  . Lack of Transportation (Non-Medical): Not on file  Physical Activity:   . Days of Exercise per Week: Not on file  . Minutes of Exercise per Session: Not on file  Stress:   . Feeling of Stress : Not on file  Social Connections:   . Frequency of Communication with Friends and  Family: Not on file  . Frequency of Social Gatherings with Friends and Family: Not on file  . Attends Religious Services: Not on file  . Active Member of Clubs or Organizations: Not on file  . Attends Archivist Meetings: Not on file  . Marital Status: Not on file  Intimate Partner Violence:   . Fear of Current or Ex-Partner: Not on file  . Emotionally Abused: Not on file  . Physically Abused: Not on file  . Sexually Abused: Not on file    FAMILY HISTORY: Family History  Problem Relation Age of Onset  . Prostate cancer Father   . Pancreatic cancer Paternal Grandmother     Review of Systems  Constitutional: Negative for appetite change, chills, fatigue, fever and unexpected weight change.  HENT:   Negative for hearing loss, lump/mass, sore throat and trouble swallowing.   Eyes: Negative for eye problems and icterus.  Respiratory: Negative for chest tightness, cough and shortness of breath.   Cardiovascular: Negative for chest pain, leg swelling and palpitations.  Gastrointestinal: Negative for abdominal distention, abdominal pain, diarrhea, nausea and vomiting.  Endocrine: Negative for hot flashes.  Genitourinary: Negative for difficulty urinating.   Musculoskeletal: Negative for arthralgias.  Skin: Negative for itching and rash.  Neurological: Negative for dizziness, extremity weakness and headaches.  Hematological: Negative for adenopathy. Does not bruise/bleed easily.  Psychiatric/Behavioral: Negative for depression. The patient is not nervous/anxious.       PHYSICAL EXAMINATION  ECOG PERFORMANCE STATUS: 1 - Symptomatic but completely ambulatory  Vitals:   08/12/19 1002  BP: 102/71  Pulse: 91  Resp: 20  Temp: 99.1 F (37.3 C)  SpO2: 100%    Physical Exam Constitutional:      General: She is not in acute distress.    Appearance: Normal appearance. She is not toxic-appearing.  HENT:     Head: Normocephalic and atraumatic.  Eyes:     General: No  scleral icterus. Cardiovascular:     Rate and Rhythm: Normal rate and regular rhythm.     Pulses: Normal pulses.     Heart sounds: Normal heart sounds.  Pulmonary:     Effort: Pulmonary effort is normal.     Breath sounds: Normal breath sounds.  Abdominal:     General: Abdomen is flat. There is no distension.     Palpations: Abdomen is soft.     Tenderness: There is no abdominal tenderness.  Musculoskeletal:        General: No swelling.     Cervical back: Neck supple.  Skin:    General: Skin is warm and dry.     Capillary Refill: Capillary refill takes less than 2 seconds.     Findings: No rash.  Neurological:     General: No focal deficit present.     Mental Status: She is alert.  Psychiatric:        Mood and Affect: Mood normal.        Behavior: Behavior normal.     LABORATORY DATA:  CBC    Component Value Date/Time   WBC 3.1 (L) 08/12/2019 0940   WBC 3.3 (L) 06/22/2019 1135   RBC 3.62 (L) 08/12/2019 0940   HGB 11.0 (L) 08/12/2019 0940   HCT 33.2 (L) 08/12/2019 0940   PLT 393 08/12/2019 0940   MCV 91.7 08/12/2019 0940   MCH 30.4 08/12/2019 0940   MCHC 33.1 08/12/2019 0940   RDW 19.2 (H) 08/12/2019 0940   LYMPHSABS 0.5 (L) 08/12/2019 0940   MONOABS 0.8 08/12/2019 0940   EOSABS 0.0 08/12/2019 0940   BASOSABS 0.0 08/12/2019 0940    CMP     Component Value Date/Time   NA 139 08/12/2019 0940   K 4.1 08/12/2019 0940   CL 107 08/12/2019 0940   CO2 25 08/12/2019 0940   GLUCOSE 85 08/12/2019 0940   BUN 6 08/12/2019 0940   CREATININE 0.62 08/12/2019 0940   CALCIUM 9.0 08/12/2019 0940   PROT 6.2 (L) 08/12/2019 0940   ALBUMIN 3.7 08/12/2019 0940   AST 50 (H) 08/12/2019 0940   ALT 60 (H) 08/12/2019 0940   ALKPHOS 76 08/12/2019 0940   BILITOT 0.7 08/12/2019 0940   GFRNONAA >60 08/12/2019 0940   GFRAA >60 08/12/2019 0940      ASSESSMENT and THERAPY PLAN:   Malignant neoplasm of lower-inner quadrant of right breast of female, estrogen receptor positive  ([place]) 05/15/2019:Patient palpated a right breast lump for two years. Diagnostic mammogram showed a 4.5cm right breast mass at the 6 o'clock position involving the overlying skin, 2 benign appearing left breast mass at the 10 o'clock position measuring 2.4cm and 2.1cm, and no axillary adenopathy. 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    To note:   ultrasound-guided liver biopsy: Benign X-ray left femur: Corresponding to the site of uptake on bone scan is an ill-defined sclerotic focus in the distal femoral diaphysis. It supports a benign etiology 07/02/2019 MRI left femur: Negative for metastatic disease likely tiny enchondroma or focus of active marrow   Treatment plan: 1.Neoadjuvant chemotherapywith dose dense Adriamycin and Cytoxan x4 followed by Taxol x12starting 06/10/2019 2.Breast conserving surgery with sentinel lymph node biopsy 3.Adjuvant radiation 4.Follow-up adjuvant antiestrogen therapy (patient may be opting for oophorectomy plus anastrozole) ----------------------------------------------------------------------------------------------------------------------------------------------- Current treatment:Cycle 1 weekly Taxol  Reviewed risks and benefits of weekly taxol in detail.  Discussed peripheral  Neuropathy in detail.  Reviewed cryotherapy recommendations.  I recommended that she drink plenty of fluids and we also discussed her updated anti emetics.  She will take colace daily x 2 days after chemotherapy, then go to every other day.    She will return weekly for treatment, then in 2 weeks for f/u with [place].      Orders Placed This Encounter  Procedures  . Vitamin D 25 hydroxy    Standing Status:   Future    Number of Occurrences:   1    Standing Expiration Date:   08/11/2020  . Folate,  Serum    Standing Status:   Future    Number of Occurrences:   1    Standing Expiration Date:   08/11/2020  . Vitamin B12    Standing Status:   Future    Number of Occurrences:   1    Standing Expiration Date:   08/11/2020    All questions were answered. The patient knows to call the clinic with any problems, questions or concerns. We can certainly see the patient much sooner if necessary.  Time spent this encounter: 30 minutes This note was electronically signed. Scot Dock, NP 08/12/2019

## 2019-08-12 NOTE — Progress Notes (Signed)
Nutrition Follow-up:  Patient with right breast cancer.  Followed by Dr. Lindi Adie and receiving chemotherapy.  Starting taxol today.  [place].    Met with patient during infusion.  Patient reports that intake has been limited these last few weeks as last round of chemotherapy took toll on her.  Also issues with constipation and anal fissures.  Reports that she has mostly been eating bananas, almonds, soups (with veggie crumbles and beans included), bagel with cream cheese.  Has not been able to tolerate premier protein shakes or milky food items. Continues to drink water daily. Reports constipation has improved.       Medications: reviewed  Labs: reviewed  Anthropometrics:   Weight 151 lb 1.6 oz today decreased from 158 lb 3.2 oz on 1/13.    NUTRITION DIAGNOSIS: Unintentional weight loss continues   INTERVENTION:  Discussed food options to increase calories and protein.  Encouraged adequate hydration Continue bowel regimen to prevent constipation.     MONITORING, EVALUATION, GOAL: Patient will continue to meet nutritional needs with gastric bypass and receiving treatment for breast cancer.   NEXT VISIT: Wed, Feb 17th during infusion  Sulema Braid B. Zenia Resides, [place], [place] Registered Dietitian [phone] (pager)

## 2019-08-12 NOTE — Patient Instructions (Signed)
[facility] Discharge Instructions for Patients Receiving Chemotherapy  Today you received the following chemotherapy agent Taxol  To help prevent nausea and vomiting after your treatment, we encourage you to take your nausea medication as needed   If you develop nausea and vomiting that is not controlled by your nausea medication, call the clinic.   BELOW ARE SYMPTOMS THAT SHOULD BE REPORTED IMMEDIATELY:  *FEVER GREATER THAN 100.5 F  *CHILLS WITH OR WITHOUT FEVER  NAUSEA AND VOMITING THAT IS NOT CONTROLLED WITH YOUR NAUSEA MEDICATION  *UNUSUAL SHORTNESS OF BREATH  *UNUSUAL BRUISING OR BLEEDING  TENDERNESS IN MOUTH AND THROAT WITH OR WITHOUT PRESENCE OF ULCERS  *URINARY PROBLEMS  *BOWEL PROBLEMS  UNUSUAL RASH Items with * indicate a potential emergency and should be followed up as soon as possible.  Feel free to call the clinic should you have any questions or concerns. The clinic phone number is [phone].  Please show the CHEMO ALERT CARD at check-in to the Emergency Department and triage nurse.  Paclitaxel injection What is this medicine? PACLITAXEL (PAK li TAX el) is a chemotherapy drug. It targets fast dividing cells, like cancer cells, and causes these cells to die. This medicine is used to treat ovarian cancer, breast cancer, lung cancer, Kaposi's sarcoma, and other cancers. This medicine may be used for other purposes; ask your health care provider or pharmacist if you have questions. COMMON BRAND NAME(S): Onxol, Taxol What should I tell my health care provider before I take this medicine? They need to know if you have any of these conditions:  history of irregular heartbeat  liver disease  low blood counts, like low white cell, platelet, or red cell counts  lung or breathing disease, like asthma  tingling of the fingers or toes, or other nerve disorder  an unusual or allergic reaction to paclitaxel, alcohol, polyoxyethylated castor oil,  other chemotherapy, other medicines, foods, dyes, or preservatives  pregnant or trying to get pregnant  breast-feeding How should I use this medicine? This drug is given as an infusion into a vein. It is administered in a hospital or clinic by a specially trained health care professional. Talk to your pediatrician regarding the use of this medicine in children. Special care may be needed. Overdosage: If you think you have taken too much of this medicine contact a poison control center or emergency room at once. NOTE: This medicine is only for you. Do not share this medicine with others. What if I miss a dose? It is important not to miss your dose. Call your doctor or health care professional if you are unable to keep an appointment. What may interact with this medicine? Do not take this medicine with any of the following medications:  disulfiram  metronidazole This medicine may also interact with the following medications:  antiviral medicines for hepatitis, HIV or AIDS  certain antibiotics like erythromycin and clarithromycin  certain medicines for fungal infections like ketoconazole and itraconazole  certain medicines for seizures like carbamazepine, phenobarbital, phenytoin  gemfibrozil  nefazodone  rifampin  St. John's wort This list may not describe all possible interactions. Give your health care provider a list of all the medicines, herbs, non-prescription drugs, or dietary supplements you use. Also tell them if you smoke, drink alcohol, or use illegal drugs. Some items may interact with your medicine. What should I watch for while using this medicine? Your condition will be monitored carefully while you are receiving this medicine. You will need important blood work   done while you are taking this medicine. This medicine can cause serious allergic reactions. To reduce your risk you will need to take other medicine(s) before treatment with this medicine. If you experience  allergic reactions like skin rash, itching or hives, swelling of the face, lips, or tongue, tell your doctor or health care professional right away. In some cases, you may be given additional medicines to help with side effects. Follow all directions for their use. This drug may make you feel generally unwell. This is not uncommon, as chemotherapy can affect healthy cells as well as cancer cells. Report any side effects. Continue your course of treatment even though you feel ill unless your doctor tells you to stop. Call your doctor or health care professional for advice if you get a fever, chills or sore throat, or other symptoms of a cold or flu. Do not treat yourself. This drug decreases your body's ability to fight infections. Try to avoid being around people who are sick. This medicine may increase your risk to bruise or bleed. Call your doctor or health care professional if you notice any unusual bleeding. Be careful brushing and flossing your teeth or using a toothpick because you may get an infection or bleed more easily. If you have any dental work done, tell your dentist you are receiving this medicine. Avoid taking products that contain aspirin, acetaminophen, ibuprofen, naproxen, or ketoprofen unless instructed by your doctor. These medicines may hide a fever. Do not become pregnant while taking this medicine. Women should inform their doctor if they wish to become pregnant or think they might be pregnant. There is a potential for serious side effects to an unborn child. Talk to your health care professional or pharmacist for more information. Do not breast-feed an infant while taking this medicine. Men are advised not to father a child while receiving this medicine. This product may contain alcohol. Ask your pharmacist or healthcare provider if this medicine contains alcohol. Be sure to tell all healthcare providers you are taking this medicine. Certain medicines, like metronidazole and  disulfiram, can cause an unpleasant reaction when taken with alcohol. The reaction includes flushing, headache, nausea, vomiting, sweating, and increased thirst. The reaction can last from 30 minutes to several hours. What side effects may I notice from receiving this medicine? Side effects that you should report to your doctor or health care professional as soon as possible:  allergic reactions like skin rash, itching or hives, swelling of the face, lips, or tongue  breathing problems  changes in vision  fast, irregular heartbeat  high or low blood pressure  mouth sores  pain, tingling, numbness in the hands or feet  signs of decreased platelets or bleeding - bruising, pinpoint red spots on the skin, black, tarry stools, blood in the urine  signs of decreased red blood cells - unusually weak or tired, feeling faint or lightheaded, falls  signs of infection - fever or chills, cough, sore throat, pain or difficulty passing urine  signs and symptoms of liver injury like dark yellow or brown urine; general ill feeling or flu-like symptoms; light-colored stools; loss of appetite; nausea; right upper belly pain; unusually weak or tired; yellowing of the eyes or skin  swelling of the ankles, feet, hands  unusually slow heartbeat Side effects that usually do not require medical attention (report to your doctor or health care professional if they continue or are bothersome):  diarrhea  hair loss  loss of appetite  muscle or joint pain    nausea, vomiting  pain, redness, or irritation at site where injected  tiredness This list may not describe all possible side effects. Call your doctor for medical advice about side effects. You may report side effects to FDA at 1-800-FDA-1088. Where should I keep my medicine? This drug is given in a hospital or clinic and will not be stored at home. NOTE: This sheet is a summary. It may not cover all possible information. If you have questions  about this medicine, talk to your doctor, pharmacist, or health care provider.  2020 Elsevier/Gold Standard (2017-02-26 13:14:55)      

## 2019-08-12 NOTE — Progress Notes (Signed)
Pt tolerated 1hr Taxol titrated infusion well, able to eat and drink during without any issues.  VSS.

## 2019-08-19 ENCOUNTER — Inpatient Hospital Stay: Payer: No Typology Code available for payment source

## 2019-08-19 ENCOUNTER — Encounter: Payer: Self-pay | Admitting: *Deleted

## 2019-08-19 ENCOUNTER — Other Ambulatory Visit: Payer: Self-pay

## 2019-08-19 VITALS — BP 111/73 | HR 87 | Temp 97.6°F | Resp 18

## 2019-08-19 DIAGNOSIS — Z170 Estrogen receptor positive status [ER+]: Secondary | ICD-10-CM

## 2019-08-19 DIAGNOSIS — C50311 Malignant neoplasm of lower-inner quadrant of right female breast: Secondary | ICD-10-CM

## 2019-08-19 DIAGNOSIS — Z5111 Encounter for antineoplastic chemotherapy: Secondary | ICD-10-CM | POA: Diagnosis not present

## 2019-08-19 LAB — CBC WITH DIFFERENTIAL (CANCER CENTER ONLY)
Abs Immature Granulocytes: 0.03 10*3/uL (ref 0.00–0.07)
Basophils Absolute: 0.1 10*3/uL (ref 0.0–0.1)
Basophils Relative: 3 %
Eosinophils Absolute: 0 10*3/uL (ref 0.0–0.5)
Eosinophils Relative: 1 %
HCT: 31.7 % — ABNORMAL LOW (ref 36.0–46.0)
Hemoglobin: 10.5 g/dL — ABNORMAL LOW (ref 12.0–15.0)
Immature Granulocytes: 1 %
Lymphocytes Relative: 14 %
Lymphs Abs: 0.5 10*3/uL — ABNORMAL LOW (ref 0.7–4.0)
MCH: 30.5 pg (ref 26.0–34.0)
MCHC: 33.1 g/dL (ref 30.0–36.0)
MCV: 92.2 fL (ref 80.0–100.0)
Monocytes Absolute: 0.5 10*3/uL (ref 0.1–1.0)
Monocytes Relative: 12 %
Neutro Abs: 2.7 10*3/uL (ref 1.7–7.7)
Neutrophils Relative %: 69 %
Platelet Count: 326 10*3/uL (ref 150–400)
RBC: 3.44 MIL/uL — ABNORMAL LOW (ref 3.87–5.11)
RDW: 18 % — ABNORMAL HIGH (ref 11.5–15.5)
WBC Count: 3.8 10*3/uL — ABNORMAL LOW (ref 4.0–10.5)
nRBC: 0 % (ref 0.0–0.2)

## 2019-08-19 LAB — CMP (CANCER CENTER ONLY)
ALT: 88 U/L — ABNORMAL HIGH (ref 0–44)
AST: 72 U/L — ABNORMAL HIGH (ref 15–41)
Albumin: 3.9 g/dL (ref 3.5–5.0)
Alkaline Phosphatase: 72 U/L (ref 38–126)
Anion gap: 10 (ref 5–15)
BUN: <4 mg/dL — ABNORMAL LOW (ref 6–20)
CO2: 23 mmol/L (ref 22–32)
Calcium: 9.1 mg/dL (ref 8.9–10.3)
Chloride: 107 mmol/L (ref 98–111)
Creatinine: 0.6 mg/dL (ref 0.44–1.00)
GFR, Est AFR Am: >60 mL/min (ref >60)
GFR, Estimated: >60 mL/min (ref >60)
Glucose, Bld: 94 mg/dL (ref 70–99)
Potassium: 3.9 mmol/L (ref 3.5–5.1)
Sodium: 140 mmol/L (ref 135–145)
Total Bilirubin: 0.7 mg/dL (ref 0.3–1.2)
Total Protein: 6.5 g/dL (ref 6.5–8.1)

## 2019-08-19 LAB — PREGNANCY, URINE: Preg Test, Ur: NEGATIVE

## 2019-08-19 MED ORDER — SODIUM CHLORIDE 0.9 % IV SOLN
Freq: Once | INTRAVENOUS | Status: AC
  Filled 2019-08-19: qty 250

## 2019-08-19 MED ORDER — DEXAMETHASONE SODIUM PHOSPHATE 10 MG/ML IJ SOLN
INTRAMUSCULAR | Status: AC
  Filled 2019-08-19: qty 1

## 2019-08-19 MED ORDER — FAMOTIDINE IN NACL 20-0.9 MG/50ML-% IV SOLN
INTRAVENOUS | Status: AC
  Filled 2019-08-19: qty 50

## 2019-08-19 MED ORDER — DEXAMETHASONE SODIUM PHOSPHATE 10 MG/ML IJ SOLN
10.0000 mg | Freq: Once | INTRAMUSCULAR | Status: AC
  Administered 2019-08-19: 10 mg via INTRAVENOUS

## 2019-08-19 MED ORDER — FAMOTIDINE IN NACL 20-0.9 MG/50ML-% IV SOLN
20.0000 mg | Freq: Once | INTRAVENOUS | Status: AC
  Administered 2019-08-19: 20 mg via INTRAVENOUS

## 2019-08-19 MED ORDER — SODIUM CHLORIDE 0.9% FLUSH
10.0000 mL | INTRAVENOUS | Status: DC | PRN
  Administered 2019-08-19: 10 mL
  Filled 2019-08-19: qty 10

## 2019-08-19 MED ORDER — SODIUM CHLORIDE 0.9 % IV SOLN
80.0000 mg/m2 | Freq: Once | INTRAVENOUS | Status: AC
  Administered 2019-08-19: 156 mg via INTRAVENOUS
  Filled 2019-08-19: qty 26

## 2019-08-19 MED ORDER — HEPARIN SOD (PORK) LOCK FLUSH 100 UNIT/ML IV SOLN
500.0000 [IU] | Freq: Once | INTRAVENOUS | Status: AC | PRN
  Administered 2019-08-19: 500 [IU]
  Filled 2019-08-19: qty 5

## 2019-08-19 MED ORDER — DIPHENHYDRAMINE HCL 50 MG/ML IJ SOLN
INTRAMUSCULAR | Status: AC
  Filled 2019-08-19: qty 1

## 2019-08-19 MED ORDER — DIPHENHYDRAMINE HCL 50 MG/ML IJ SOLN
25.0000 mg | Freq: Once | INTRAMUSCULAR | Status: AC
  Administered 2019-08-19: 25 mg via INTRAVENOUS

## 2019-08-19 NOTE — Progress Notes (Signed)
Per MD okay to treat with ALT 88

## 2019-08-25 ENCOUNTER — Ambulatory Visit: Payer: 59 | Admitting: Hematology and Oncology

## 2019-08-25 ENCOUNTER — Other Ambulatory Visit: Payer: 59

## 2019-08-25 ENCOUNTER — Ambulatory Visit: Payer: 59

## 2019-08-26 ENCOUNTER — Inpatient Hospital Stay: Payer: No Typology Code available for payment source

## 2019-08-26 ENCOUNTER — Inpatient Hospital Stay (HOSPITAL_BASED_OUTPATIENT_CLINIC_OR_DEPARTMENT_OTHER): Payer: No Typology Code available for payment source | Admitting: Adult Health

## 2019-08-26 ENCOUNTER — Encounter: Payer: Self-pay | Admitting: Adult Health

## 2019-08-26 ENCOUNTER — Other Ambulatory Visit: Payer: Self-pay

## 2019-08-26 VITALS — BP 113/79 | HR 92 | Temp 99.8°F | Resp 18 | Ht 65.0 in | Wt 146.9 lb

## 2019-08-26 DIAGNOSIS — Z170 Estrogen receptor positive status [ER+]: Secondary | ICD-10-CM | POA: Diagnosis not present

## 2019-08-26 DIAGNOSIS — C50311 Malignant neoplasm of lower-inner quadrant of right female breast: Secondary | ICD-10-CM

## 2019-08-26 DIAGNOSIS — Z5111 Encounter for antineoplastic chemotherapy: Secondary | ICD-10-CM | POA: Diagnosis not present

## 2019-08-26 DIAGNOSIS — Z95828 Presence of other vascular implants and grafts: Secondary | ICD-10-CM

## 2019-08-26 LAB — CMP (CANCER CENTER ONLY)
ALT: 89 U/L — ABNORMAL HIGH (ref 0–44)
AST: 66 U/L — ABNORMAL HIGH (ref 15–41)
Albumin: 3.9 g/dL (ref 3.5–5.0)
Alkaline Phosphatase: 59 U/L (ref 38–126)
Anion gap: 9 (ref 5–15)
BUN: 7 mg/dL (ref 6–20)
CO2: 22 mmol/L (ref 22–32)
Calcium: 9.2 mg/dL (ref 8.9–10.3)
Chloride: 109 mmol/L (ref 98–111)
Creatinine: 0.64 mg/dL (ref 0.44–1.00)
GFR, Est AFR Am: >60 mL/min (ref >60)
GFR, Estimated: >60 mL/min (ref >60)
Glucose, Bld: 95 mg/dL (ref 70–99)
Potassium: 3.6 mmol/L (ref 3.5–5.1)
Sodium: 140 mmol/L (ref 135–145)
Total Bilirubin: 0.8 mg/dL (ref 0.3–1.2)
Total Protein: 6.5 g/dL (ref 6.5–8.1)

## 2019-08-26 LAB — CBC WITH DIFFERENTIAL (CANCER CENTER ONLY)
Abs Immature Granulocytes: 0.03 10*3/uL (ref 0.00–0.07)
Basophils Absolute: 0.1 10*3/uL (ref 0.0–0.1)
Basophils Relative: 2 %
Eosinophils Absolute: 0.3 10*3/uL (ref 0.0–0.5)
Eosinophils Relative: 10 %
HCT: 29.7 % — ABNORMAL LOW (ref 36.0–46.0)
Hemoglobin: 10.1 g/dL — ABNORMAL LOW (ref 12.0–15.0)
Immature Granulocytes: 1 %
Lymphocytes Relative: 18 %
Lymphs Abs: 0.5 10*3/uL — ABNORMAL LOW (ref 0.7–4.0)
MCH: 31.3 pg (ref 26.0–34.0)
MCHC: 34 g/dL (ref 30.0–36.0)
MCV: 92 fL (ref 80.0–100.0)
Monocytes Absolute: 0.4 10*3/uL (ref 0.1–1.0)
Monocytes Relative: 13 %
Neutro Abs: 1.7 10*3/uL (ref 1.7–7.7)
Neutrophils Relative %: 56 %
Platelet Count: 194 10*3/uL (ref 150–400)
RBC: 3.23 MIL/uL — ABNORMAL LOW (ref 3.87–5.11)
RDW: 17.8 % — ABNORMAL HIGH (ref 11.5–15.5)
WBC Count: 3 10*3/uL — ABNORMAL LOW (ref 4.0–10.5)
nRBC: 0 % (ref 0.0–0.2)

## 2019-08-26 LAB — PREGNANCY, URINE: Preg Test, Ur: NEGATIVE

## 2019-08-26 MED ORDER — SODIUM CHLORIDE 0.9% FLUSH
10.0000 mL | INTRAVENOUS | Status: DC | PRN
  Administered 2019-08-26: 09:00:00 10 mL
  Filled 2019-08-26: qty 10

## 2019-08-26 MED ORDER — FAMOTIDINE IN NACL 20-0.9 MG/50ML-% IV SOLN
INTRAVENOUS | Status: AC
  Filled 2019-08-26: qty 50

## 2019-08-26 MED ORDER — DIPHENHYDRAMINE HCL 50 MG/ML IJ SOLN
25.0000 mg | Freq: Once | INTRAMUSCULAR | Status: AC
  Administered 2019-08-26: 25 mg via INTRAVENOUS

## 2019-08-26 MED ORDER — HEPARIN SOD (PORK) LOCK FLUSH 100 UNIT/ML IV SOLN
500.0000 [IU] | Freq: Once | INTRAVENOUS | Status: AC | PRN
  Administered 2019-08-26: 500 [IU]
  Filled 2019-08-26: qty 5

## 2019-08-26 MED ORDER — FAMOTIDINE IN NACL 20-0.9 MG/50ML-% IV SOLN
20.0000 mg | Freq: Once | INTRAVENOUS | Status: AC
  Administered 2019-08-26: 20 mg via INTRAVENOUS

## 2019-08-26 MED ORDER — SODIUM CHLORIDE 0.9 % IV SOLN
80.0000 mg/m2 | Freq: Once | INTRAVENOUS | Status: AC
  Administered 2019-08-26: 138 mg via INTRAVENOUS
  Filled 2019-08-26: qty 23

## 2019-08-26 MED ORDER — DEXAMETHASONE SODIUM PHOSPHATE 10 MG/ML IJ SOLN
INTRAMUSCULAR | Status: AC
  Filled 2019-08-26: qty 1

## 2019-08-26 MED ORDER — SODIUM CHLORIDE 0.9% FLUSH
10.0000 mL | INTRAVENOUS | Status: DC | PRN
  Administered 2019-08-26: 10 mL
  Filled 2019-08-26: qty 10

## 2019-08-26 MED ORDER — SODIUM CHLORIDE 0.9 % IV SOLN
Freq: Once | INTRAVENOUS | Status: AC
  Filled 2019-08-26: qty 250

## 2019-08-26 MED ORDER — DEXAMETHASONE SODIUM PHOSPHATE 10 MG/ML IJ SOLN
10.0000 mg | Freq: Once | INTRAMUSCULAR | Status: AC
  Administered 2019-08-26: 10 mg via INTRAVENOUS

## 2019-08-26 MED ORDER — SODIUM CHLORIDE 0.9 % IV SOLN: 80.0000 mg/m2 | Freq: Once | INTRAVENOUS | Status: DC

## 2019-08-26 MED ORDER — DIPHENHYDRAMINE HCL 50 MG/ML IJ SOLN
INTRAMUSCULAR | Status: AC
  Filled 2019-08-26: qty 1

## 2019-08-26 NOTE — Progress Notes (Signed)
Nutrition Follow-up:   Patient with right breast cancer.  Patient receiving taxol.  [place].    Met with patient during infusion. Patient reports that intake has improved over the last 2 weeks.  Reports that she still needs to work on getting more protein.  Has been trying to nibble q 2 hours.  Has been able to eat 1/4 bagel with cream cheese, snacking on nuts, greek yogurt, small amounts of premier protein, tuna.  Reports constipation has resolved. Has been able to walk in neighborhood recently.  Feels better with eating more.     Medications: reviewed  Labs: reviewed  Anthropometrics:   Weight 146 lb 14.4 oz today decreased from 151 lb    NUTRITION DIAGNOSIS: Unintentional weight loss continues   INTERVENTION:  Encouraged patient to continue eating good sources of protein and liberalizing diet.   Patient to continue nibbling q 2 hours. Patient has contact information    MONITORING, EVALUATION, GOAL: Patient will continue to meet nutritional needs with gastric bypass and receiving treatment for breast cancer.   NEXT VISIT: Wed, March 3rd  Annaston Upham B. Zenia Resides, [place], [place] Registered Dietitian [phone] (pager)

## 2019-08-26 NOTE — Progress Notes (Signed)
Per Wilber Bihari, NP it is ok to treat with Taxol today with ALT 89

## 2019-08-26 NOTE — Patient Instructions (Signed)
[facility] Discharge Instructions for Patients Receiving Chemotherapy  Today you received the following chemotherapy agents:  Taxol.  To help prevent nausea and vomiting after your treatment, we encourage you to take your nausea medication as directed.   If you develop nausea and vomiting that is not controlled by your nausea medication, call the clinic.   BELOW ARE SYMPTOMS THAT SHOULD BE REPORTED IMMEDIATELY:  *FEVER GREATER THAN 100.5 F  *CHILLS WITH OR WITHOUT FEVER  NAUSEA AND VOMITING THAT IS NOT CONTROLLED WITH YOUR NAUSEA MEDICATION  *UNUSUAL SHORTNESS OF BREATH  *UNUSUAL BRUISING OR BLEEDING  TENDERNESS IN MOUTH AND THROAT WITH OR WITHOUT PRESENCE OF ULCERS  *URINARY PROBLEMS  *BOWEL PROBLEMS  UNUSUAL RASH Items with * indicate a potential emergency and should be followed up as soon as possible.  Feel free to call the clinic should you have any questions or concerns. The clinic phone number is [phone].  Please show the CHEMO ALERT CARD at check-in to the Emergency Department and triage nurse.   

## 2019-08-26 NOTE — Progress Notes (Signed)
Spoke with the patient in infusion, she stated that she discussed starting Zoladex with Mendel Ryder. She wasn't planning to start the injections today but wanted to get the orders in so that insurance investigation could be initiated. The patient said she would like [place] know if she decides to proceed with the injections. Plan confirmed with Mendel Ryder.   Also, adjust paclitaxel dose for weight loss per Mendel Ryder.   Demetrius Charity, PharmD, [facility] Pharmacist Pharmacy Phone: [phone] 08/26/2019

## 2019-08-26 NOTE — Progress Notes (Signed)
[facility] Follow up:    Kimberly Panda, MD [address] [place] [address]   DIAGNOSIS: Cancer Staging Malignant neoplasm of lower-inner quadrant of right breast of female, estrogen receptor positive ([place]) Staging form: Breast, AJCC 8th Edition - Clinical stage from 05/27/2019: Stage IIA (cT2, cN0, cM0, G3, ER+, PR+, HER2: Equivocal) - Unsigned   SUMMARY OF ONCOLOGIC HISTORY: Oncology History  Malignant neoplasm of lower-inner quadrant of right breast of female, estrogen receptor positive ([place])  05/25/2019 Initial Diagnosis   Patient palpated a right breast lump for two years. Diagnostic mammogram showed a 4.5cm right breast mass at the 6 o'clock position involving the overlying skin, 2 benign appearing left breast mass at the 10 o'clock position measuring 2.4cm and 2.1cm, and no axillary adenopathy. Biopsy showed: in the left breast, fibroadenoma and no evidence of malignancy, and in the right breast, IDC with DCIS, grade 3.  ER 70%, PR 30%, Ki-67 40%, HER-2 negative by [facility]    06/10/2019 -  Chemotherapy   The patient had DOXOrubicin (ADRIAMYCIN) chemo injection 118 mg, 60 mg/m2 = 118 mg, Intravenous,  Once, 4 of 4 cycles Dose modification: 50 mg/m2 (original dose 60 mg/m2, Cycle 2, Reason: Dose not tolerated) Administration: 118 mg (06/10/2019), 98 mg (06/24/2019), 98 mg (07/08/2019), 98 mg (07/22/2019) palonosetron (ALOXI) injection 0.25 mg, 0.25 mg, Intravenous,  Once, 4 of 4 cycles Administration: 0.25 mg (06/10/2019), 0.25 mg (06/24/2019), 0.25 mg (07/08/2019), 0.25 mg (07/22/2019) pegfilgrastim-cbqv (UDENYCA) injection 6 mg, 6 mg, Subcutaneous, Once, 4 of 4 cycles Administration: 6 mg (06/12/2019), 6 mg (06/26/2019), 6 mg (07/10/2019), 6 mg (07/24/2019) cyclophosphamide (CYTOXAN) 1,180 mg in sodium chloride 0.9 % 250 mL chemo infusion, 600 mg/m2 = 1,180 mg, Intravenous,  Once, 4 of 4 cycles Dose modification: 500 mg/m2 (original dose 600 mg/m2, Cycle 2, Reason:  Dose not tolerated) Administration: 1,180 mg (06/10/2019), 980 mg (06/24/2019), 980 mg (07/08/2019), 980 mg (07/22/2019) PACLitaxel (TAXOL) 156 mg in sodium chloride 0.9 % 250 mL chemo infusion (</= 106m/m2), 80 mg/m2 = 156 mg, Intravenous,  Once, 3 of 12 cycles Administration: 156 mg (08/12/2019), 156 mg (08/19/2019), 138 mg (08/26/2019) fosaprepitant (EMEND) 150 mg, dexamethasone (DECADRON) 12 mg in sodium chloride 0.9 % 145 mL IVPB, , Intravenous,  Once, 4 of 4 cycles Administration:  (06/10/2019),  (06/24/2019),  (07/08/2019),  (07/22/2019)  for chemotherapy treatment.     Genetic Testing   No pathogenic variants identified. VUS in DSymsoniacalled c.2378A>G identified on the [facility] Common Hereditary Cancers Panel. The report date is 06/15/2019.  The Common Hereditary Cancers Panel offered by [facility] includes sequencing and/or deletion duplication testing of the following 48 genes: APC, ATM, AXIN2, BARD1, BMPR1A, BRCA1, BRCA2, BRIP1, CDH1, CDKN2A (p14ARF), CDKN2A (p16INK4a), CKD4, CHEK2, CTNNA1, DICER1, EPCAM (Deletion/duplication testing only), GREM1 (promoter region deletion/duplication testing only), KIT, MEN1, MLH1, MSH2, MSH3, MSH6, MUTYH, NBN, NF1, NHTL1, PALB2, PDGFRA, PMS2, POLD1, POLE, PTEN, RAD50, RAD51C, RAD51D, RNF43, SDHB, SDHC, SDHD, SMAD4, SMARCA4. STK11, TP53, TSC1, TSC2, and VHL.  The following genes were evaluated for sequence changes only: SDHA and HOXB13 c.251G>A variant only.     CURRENT THERAPY: Weekly Taxol #3  INTERVAL HISTORY: [name]89y.o. female returns for evaluation prior to her weekly Taxol.  She continues to lose weight (had gastric bypass prior to breast cancer diagnosis).  She is doing well.  She has no peripheral neuropathy.  She is feeling much better with the new chemotherapy.  She is less fatigued and eating well.  She was  able to walk this week.   [place] still has her nexplanon.  She wants to know the difference between oopherectomy and zoladex.      Patient Active Problem List   Diagnosis Date Noted  . Port-A-Cath in place 07/22/2019  . Genetic testing 06/15/2019  . Family history of prostate cancer   . Family history of pancreatic cancer   . Malignant neoplasm of lower-inner quadrant of right breast of female, estrogen receptor positive ([place]) 05/25/2019    has No Known Allergies.  MEDICAL HISTORY: Past Medical History:  Diagnosis Date  . Cancer [facility])    breast  . Family history of pancreatic cancer   . Family history of prostate cancer     SURGICAL HISTORY: Past Surgical History:  Procedure Laterality Date  . GASTRIC BYPASS    . HERNIA REPAIR    . PORTACATH PLACEMENT Right 06/09/2019   Procedure: INSERTION PORT-A-CATH WITH ULTRASOUND;  Surgeon: Rolm Bookbinder, MD;  Location: [place];  Service: General;  Laterality: Right;    SOCIAL HISTORY: Social History   Socioeconomic History  . Marital status: Married    Spouse name: Not on file  . Number of children: Not on file  . Years of education: Not on file  . Highest education level: Not on file  Occupational History  . Not on file  Tobacco Use  . Smoking status: Former Research scientist (life sciences)  . Smokeless tobacco: Never Used  Substance and Sexual Activity  . Alcohol use: Never  . Drug use: Never  . Sexual activity: Not on file  Other Topics Concern  . Not on file  Social History Narrative  . Not on file   Social Determinants of Health   Financial Resource Strain:   . Difficulty of Paying Living Expenses: Not on file  Food Insecurity:   . Worried About Charity fundraiser in the Last Year: Not on file  . Ran Out of Food in the Last Year: Not on file  Transportation Needs:   . Lack of Transportation (Medical): Not on file  . Lack of Transportation (Non-Medical): Not on file  Physical Activity:   . Days of Exercise per Week: Not on file  . Minutes of Exercise per Session: Not on file  Stress:   . Feeling of Stress : Not on file  Social  Connections:   . Frequency of Communication with Friends and Family: Not on file  . Frequency of Social Gatherings with Friends and Family: Not on file  . Attends Religious Services: Not on file  . Active Member of Clubs or Organizations: Not on file  . Attends Archivist Meetings: Not on file  . Marital Status: Not on file  Intimate Partner Violence:   . Fear of Current or Ex-Partner: Not on file  . Emotionally Abused: Not on file  . Physically Abused: Not on file  . Sexually Abused: Not on file    FAMILY HISTORY: Family History  Problem Relation Age of Onset  . Prostate cancer Father   . Pancreatic cancer Paternal Grandmother     Review of Systems  Constitutional: Positive for appetite change and fatigue. Negative for fever.  HENT:   Negative for hearing loss and mouth sores.   Eyes: Negative for eye problems and icterus.  Respiratory: Negative for chest tightness, cough, shortness of breath and wheezing.   Cardiovascular: Negative for chest pain, leg swelling and palpitations.  Gastrointestinal: Negative for abdominal distention, abdominal pain, constipation, diarrhea, nausea and vomiting.  Endocrine: Negative for hot flashes.  Musculoskeletal: Negative for arthralgias.  Skin: Negative for itching and rash.  Neurological: Negative for dizziness, extremity weakness, headaches and numbness.  Hematological: Negative for adenopathy. Does not bruise/bleed easily.  Psychiatric/Behavioral: Negative for depression. The patient is not nervous/anxious.       PHYSICAL EXAMINATION  ECOG PERFORMANCE STATUS: 1 - Symptomatic but completely ambulatory  Vitals:   08/26/19 0910  BP: 113/79  Pulse: 92  Resp: 18  Temp: 99.8 F (37.7 C)  SpO2: 100%    Physical Exam Constitutional:      General: She is not in acute distress.    Appearance: Normal appearance. She is not toxic-appearing.  HENT:     Head: Normocephalic and atraumatic.  Eyes:     General:        Left  eye: No discharge.  Cardiovascular:     Rate and Rhythm: Normal rate and regular rhythm.     Pulses: Normal pulses.     Heart sounds: Normal heart sounds.  Pulmonary:     Effort: Pulmonary effort is normal.     Breath sounds: Normal breath sounds.  Abdominal:     General: Abdomen is flat. Bowel sounds are normal. There is no distension.     Palpations: Abdomen is soft.     Tenderness: There is no abdominal tenderness.  Musculoskeletal:        General: No swelling.     Cervical back: Neck supple.  Lymphadenopathy:     Cervical: No cervical adenopathy.  Skin:    General: Skin is warm and dry.     Capillary Refill: Capillary refill takes less than 2 seconds.     Findings: No rash.  Neurological:     General: No focal deficit present.     Mental Status: She is alert.  Psychiatric:        Mood and Affect: Mood normal.        Behavior: Behavior normal.     LABORATORY DATA:  CBC    Component Value Date/Time   WBC 3.0 (L) 08/26/2019 0817   WBC 3.3 (L) 06/22/2019 1135   RBC 3.23 (L) 08/26/2019 0817   HGB 10.1 (L) 08/26/2019 0817   HCT 29.7 (L) 08/26/2019 0817   PLT 194 08/26/2019 0817   MCV 92.0 08/26/2019 0817   MCH 31.3 08/26/2019 0817   MCHC 34.0 08/26/2019 0817   RDW 17.8 (H) 08/26/2019 0817   LYMPHSABS 0.5 (L) 08/26/2019 0817   MONOABS 0.4 08/26/2019 0817   EOSABS 0.3 08/26/2019 0817   BASOSABS 0.1 08/26/2019 0817    CMP     Component Value Date/Time   NA 140 08/26/2019 0817   K 3.6 08/26/2019 0817   CL 109 08/26/2019 0817   CO2 22 08/26/2019 0817   GLUCOSE 95 08/26/2019 0817   BUN 7 08/26/2019 0817   CREATININE 0.64 08/26/2019 0817   CALCIUM 9.2 08/26/2019 0817   PROT 6.5 08/26/2019 0817   ALBUMIN 3.9 08/26/2019 0817   AST 66 (H) 08/26/2019 0817   ALT 89 (H) 08/26/2019 0817   ALKPHOS 59 08/26/2019 0817   BILITOT 0.8 08/26/2019 0817   GFRNONAA >60 08/26/2019 0817   GFRAA >60 08/26/2019 0817      ASSESSMENT and PLAN:   Malignant neoplasm of  lower-inner quadrant of right breast of female, estrogen receptor positive ([place]) 05/15/2019:Patient palpated a right breast lump for two years. Diagnostic mammogram showed a 4.5cm right breast mass at the 6 o'clock position involving the overlying  skin, 2 benign appearing left breast mass at the 10 o'clock position measuring 2.4cm and 2.1cm, and no axillary adenopathy. 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    To note:   ultrasound-guided liver biopsy: Benign X-ray left femur: Corresponding to the site of uptake on bone scan is an ill-defined sclerotic focus in the distal femoral diaphysis. It supports a benign etiology 07/02/2019 MRI left femur: Negative for metastatic disease likely tiny enchondroma or focus of active marrow   Treatment plan: 1.Neoadjuvant chemotherapywith dose dense Adriamycin and Cytoxan x4 followed by Taxol x12starting 06/10/2019 2.Breast conserving surgery with sentinel lymph node biopsy 3.Adjuvant radiation 4.Follow-up adjuvant antiestrogen therapy (patient may be opting for oophorectomy plus anastrozole) ----------------------------------------------------------------------------------------------------------------------------------------------- Current treatment:Cycle 3 weekly Taxol  Tolerating Taxol quite well Has slight liver enzyme elevation which we are monitoring.  Will decrease dexamethasone to 48m in her premedications I placed an order for Zoladex, so that she can start getting this and have her nexplanon removed.   We discussed bilateral oopherectomy and what that entails.  I referred her to gyn-oncology to review the oopherectomy to help answer her questions and aid with her decision making.    She will continue with weekly Taxol, and see Dr. GLindi Adiein 2 weeks.  I reviewed  the above with him and he is in agreement with it.        Orders Placed This Encounter  Procedures  . Ambulatory referral to Gynecologic Oncology    Referral Priority:   Routine    Referral Type:   Consultation    Referral Reason:   Specialty Services Required    Requested Specialty:   Gynecologic Oncology    Number of Visits Requested:   1    All questions were answered. The patient knows to call the clinic with any problems, questions or concerns. We can certainly see the patient much sooner if necessary.  Total encounter time: 30 minutes*  LWilber Bihari NP 08/26/19 12:57 PM Medical Oncology and H[facility]ology [facility] [id number]. [phone]   Fax. [phone] *Total Encounter Time as defined by the Centers for Medicare and Medicaid Services includes, in addition to the face-to-face time of a patient visit (documented in the note above) non-face-to-face time: obtaining and reviewing outside history, ordering and reviewing medications, tests or procedures, care coordination (communications with other health care professionals or caregivers) and documentation in the medical record.

## 2019-08-26 NOTE — Assessment & Plan Note (Addendum)
05/15/2019:Patient palpated a right breast lump for two years. Diagnostic mammogram showed a 4.5cm right breast mass at the 6 o'clock position involving the overlying skin, 2 benign appearing left breast mass at the 10 o'clock position measuring 2.4cm and 2.1cm, and no axillary adenopathy. 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    To note:   ultrasound-guided liver biopsy: Benign X-ray left femur: Corresponding to the site of uptake on bone scan is an ill-defined sclerotic focus in the distal femoral diaphysis. It supports a benign etiology 07/02/2019 MRI left femur: Negative for metastatic disease likely tiny enchondroma or focus of active marrow   Treatment plan: 1.Neoadjuvant chemotherapywith dose dense Adriamycin and Cytoxan x4 followed by Taxol x12starting 06/10/2019 2.Breast conserving surgery with sentinel lymph node biopsy 3.Adjuvant radiation 4.Follow-up adjuvant antiestrogen therapy (patient may be opting for oophorectomy plus anastrozole) ----------------------------------------------------------------------------------------------------------------------------------------------- Current treatment:Cycle 3 weekly Taxol  Tolerating Taxol quite well Has slight liver enzyme elevation which we are monitoring.  Will decrease dexamethasone to 10m in her premedications I placed an order for Zoladex, so that she can start getting this and have her nexplanon removed.   We discussed bilateral oopherectomy and what that entails.  I referred her to gyn-oncology to review the oopherectomy to help answer her questions and aid with her decision making.    She will continue with weekly Taxol, and see Dr. GLindi Adiein 2 weeks.  I reviewed the above with him and he is in agreement with it.

## 2019-08-26 NOTE — Progress Notes (Signed)
Patient mentioned to this nurse that she has experienced blurred vision near and far since treatment #3 of Adriamycin and Cytoxan. Patient stated she has not mentioned this to her physician or NP. Wilber Bihari, NP made aware and stated she will decrease patient's decadron dose at her next treatment since today's premeds have already been given. Patient made aware.

## 2019-08-26 NOTE — Patient Instructions (Signed)

## 2019-08-27 ENCOUNTER — Telehealth: Payer: Self-pay | Admitting: Hematology and Oncology

## 2019-08-27 ENCOUNTER — Other Ambulatory Visit: Payer: Self-pay | Admitting: *Deleted

## 2019-08-27 ENCOUNTER — Telehealth: Payer: Self-pay | Admitting: *Deleted

## 2019-08-27 MED ORDER — LEVOFLOXACIN 500 MG PO TABS: 500.0000 mg | ORAL_TABLET | Freq: Every day | ORAL | 0 refills | Status: DC

## 2019-08-27 NOTE — Telephone Encounter (Signed)
Called and spoke with the patient, scheduled a new patient appt for 2/25. Explained the policy for mask, parking and visitors. Explained that she will have a pelvic exam

## 2019-08-27 NOTE — Telephone Encounter (Signed)
Pt called with c/o chills without fever and "not feeling well", nasal drainage and sneezing noted yesterday. Pt received Taxol cycle 3 2/17. Per Dr. Lindi Adie, Levaquin sent to pharmacy. Pt notified and given instructions.

## 2019-08-27 NOTE — Telephone Encounter (Signed)
I talk with patient regarding schedule  

## 2019-08-31 ENCOUNTER — Telehealth: Payer: Self-pay | Admitting: *Deleted

## 2019-08-31 NOTE — Telephone Encounter (Signed)
Received after hours message over the weekend regarding chest pain she was experiencing.  Attempt x1 to contact pt to follow up, no answer, LVM to return call to the office.

## 2019-09-01 ENCOUNTER — Other Ambulatory Visit: Payer: Self-pay

## 2019-09-01 ENCOUNTER — Telehealth: Payer: Self-pay | Admitting: *Deleted

## 2019-09-01 ENCOUNTER — Other Ambulatory Visit: Payer: Self-pay | Admitting: Medical

## 2019-09-01 MED ORDER — PROBIOTIC ACIDOPHILUS PO CAPS: 1.0000 | ORAL_CAPSULE | Freq: Every day | ORAL | 0 refills | Status: DC

## 2019-09-01 NOTE — Telephone Encounter (Signed)
Pt called with c/o stomach pressure since Monday evening that she feels is gas or that she needs to replenish her "gut bacteria". Pt has tried greek yogurt, walking, yoga for gas relief, and gas X. Discussed pt symptoms with Lucianne Lei PA. Per Lucianne Lei pt to take otc probiotics and continue yogurt. Pt knows to call if symptoms do not subside to be seen by Lucianne Lei.

## 2019-09-02 ENCOUNTER — Inpatient Hospital Stay: Payer: No Typology Code available for payment source

## 2019-09-02 ENCOUNTER — Other Ambulatory Visit: Payer: Self-pay | Admitting: Hematology and Oncology

## 2019-09-02 ENCOUNTER — Emergency Department (HOSPITAL_COMMUNITY)
Admission: EM | Admit: 2019-09-02 | Discharge: 2019-09-02 | Disposition: A | Discharge status: Home / Self Care | Payer: No Typology Code available for payment source | Attending: Emergency Medicine | Admitting: Emergency Medicine

## 2019-09-02 ENCOUNTER — Other Ambulatory Visit: Payer: Self-pay

## 2019-09-02 ENCOUNTER — Emergency Department (HOSPITAL_COMMUNITY): Payer: No Typology Code available for payment source

## 2019-09-02 ENCOUNTER — Encounter (HOSPITAL_COMMUNITY): Payer: Self-pay

## 2019-09-02 VITALS — BP 110/78 | HR 79 | Temp 98.5°F | Resp 16 | Ht 65.0 in | Wt 143.2 lb

## 2019-09-02 DIAGNOSIS — K5900 Constipation, unspecified: Secondary | ICD-10-CM | POA: Diagnosis not present

## 2019-09-02 DIAGNOSIS — Z87891 Personal history of nicotine dependence: Secondary | ICD-10-CM | POA: Diagnosis not present

## 2019-09-02 DIAGNOSIS — C50311 Malignant neoplasm of lower-inner quadrant of right female breast: Secondary | ICD-10-CM

## 2019-09-02 DIAGNOSIS — Z170 Estrogen receptor positive status [ER+]: Secondary | ICD-10-CM

## 2019-09-02 DIAGNOSIS — Z95828 Presence of other vascular implants and grafts: Secondary | ICD-10-CM

## 2019-09-02 DIAGNOSIS — Z79899 Other long term (current) drug therapy: Secondary | ICD-10-CM | POA: Insufficient documentation

## 2019-09-02 LAB — CBC WITH DIFFERENTIAL (CANCER CENTER ONLY)
Abs Immature Granulocytes: 0.02 10*3/uL (ref 0.00–0.07)
Basophils Absolute: 0 10*3/uL (ref 0.0–0.1)
Basophils Relative: 2 %
Eosinophils Absolute: 0.2 10*3/uL (ref 0.0–0.5)
Eosinophils Relative: 10 %
HCT: 30.4 % — ABNORMAL LOW (ref 36.0–46.0)
Hemoglobin: 10.2 g/dL — ABNORMAL LOW (ref 12.0–15.0)
Immature Granulocytes: 1 %
Lymphocytes Relative: 14 %
Lymphs Abs: 0.3 10*3/uL — ABNORMAL LOW (ref 0.7–4.0)
MCH: 31.7 pg (ref 26.0–34.0)
MCHC: 33.6 g/dL (ref 30.0–36.0)
MCV: 94.4 fL (ref 80.0–100.0)
Monocytes Absolute: 0.3 10*3/uL (ref 0.1–1.0)
Monocytes Relative: 12 %
Neutro Abs: 1.6 10*3/uL — ABNORMAL LOW (ref 1.7–7.7)
Neutrophils Relative %: 61 %
Platelet Count: 207 10*3/uL (ref 150–400)
RBC: 3.22 MIL/uL — ABNORMAL LOW (ref 3.87–5.11)
RDW: 16.8 % — ABNORMAL HIGH (ref 11.5–15.5)
WBC Count: 2.5 10*3/uL — ABNORMAL LOW (ref 4.0–10.5)
nRBC: 0 % (ref 0.0–0.2)

## 2019-09-02 LAB — CMP (CANCER CENTER ONLY)
ALT: 80 U/L — ABNORMAL HIGH (ref 0–44)
AST: 60 U/L — ABNORMAL HIGH (ref 15–41)
Albumin: 4 g/dL (ref 3.5–5.0)
Alkaline Phosphatase: 65 U/L (ref 38–126)
Anion gap: 10 (ref 5–15)
BUN: 7 mg/dL (ref 6–20)
CO2: 23 mmol/L (ref 22–32)
Calcium: 9.2 mg/dL (ref 8.9–10.3)
Chloride: 108 mmol/L (ref 98–111)
Creatinine: 0.61 mg/dL (ref 0.44–1.00)
GFR, Est AFR Am: >60 mL/min (ref >60)
GFR, Estimated: >60 mL/min (ref >60)
Glucose, Bld: 88 mg/dL (ref 70–99)
Potassium: 3.6 mmol/L (ref 3.5–5.1)
Sodium: 141 mmol/L (ref 135–145)
Total Bilirubin: 0.9 mg/dL (ref 0.3–1.2)
Total Protein: 6.6 g/dL (ref 6.5–8.1)

## 2019-09-02 LAB — LIPASE, BLOOD: Lipase: 15 U/L (ref 11–51)

## 2019-09-02 LAB — PREGNANCY, URINE: Preg Test, Ur: NEGATIVE

## 2019-09-02 LAB — I-STAT BETA HCG BLOOD, ED (MC, WL, AP ONLY): I-stat hCG, quantitative: <5 m[IU]/mL

## 2019-09-02 MED ORDER — HEPARIN SOD (PORK) LOCK FLUSH 100 UNIT/ML IV SOLN
500.0000 [IU] | Freq: Once | INTRAVENOUS | Status: AC | PRN
  Administered 2019-09-02: 500 [IU]
  Filled 2019-09-02: qty 5

## 2019-09-02 MED ORDER — SODIUM CHLORIDE 0.9 % IV SOLN
80.0000 mg/m2 | Freq: Once | INTRAVENOUS | Status: AC
  Administered 2019-09-02: 138 mg via INTRAVENOUS
  Filled 2019-09-02: qty 23

## 2019-09-02 MED ORDER — FAMOTIDINE IN NACL 20-0.9 MG/50ML-% IV SOLN
INTRAVENOUS | Status: AC
  Filled 2019-09-02: qty 50

## 2019-09-02 MED ORDER — DIPHENHYDRAMINE HCL 50 MG/ML IJ SOLN
INTRAMUSCULAR | Status: AC
  Filled 2019-09-02: qty 1

## 2019-09-02 MED ORDER — SODIUM CHLORIDE 0.9% FLUSH
3.0000 mL | Freq: Once | INTRAVENOUS | Status: AC
  Administered 2019-09-02: 3 mL via INTRAVENOUS

## 2019-09-02 MED ORDER — IOHEXOL 300 MG/ML  SOLN
100.0000 mL | Freq: Once | INTRAMUSCULAR | Status: AC | PRN
  Administered 2019-09-02: 20:00:00 100 mL via INTRAVENOUS

## 2019-09-02 MED ORDER — SODIUM CHLORIDE 0.9% FLUSH
10.0000 mL | INTRAVENOUS | Status: DC | PRN
  Administered 2019-09-02: 10 mL
  Filled 2019-09-02: qty 10

## 2019-09-02 MED ORDER — SODIUM CHLORIDE 0.9 % IV SOLN
INTRAVENOUS | Status: DC
  Administered 2019-09-02: 20:00:00 20 mL/h via INTRAVENOUS

## 2019-09-02 MED ORDER — FAMOTIDINE IN NACL 20-0.9 MG/50ML-% IV SOLN
20.0000 mg | Freq: Once | INTRAVENOUS | Status: AC
  Administered 2019-09-02: 20 mg via INTRAVENOUS

## 2019-09-02 MED ORDER — DEXAMETHASONE SODIUM PHOSPHATE 10 MG/ML IJ SOLN
4.0000 mg | Freq: Once | INTRAMUSCULAR | Status: AC
  Administered 2019-09-02: 4 mg via INTRAVENOUS

## 2019-09-02 MED ORDER — SODIUM CHLORIDE 0.9 % IV SOLN
Freq: Once | INTRAVENOUS | Status: AC
  Filled 2019-09-02: qty 250

## 2019-09-02 MED ORDER — DIPHENHYDRAMINE HCL 50 MG/ML IJ SOLN
25.0000 mg | Freq: Once | INTRAMUSCULAR | Status: AC
  Administered 2019-09-02: 25 mg via INTRAVENOUS

## 2019-09-02 MED ORDER — DEXAMETHASONE SODIUM PHOSPHATE 10 MG/ML IJ SOLN
INTRAMUSCULAR | Status: AC
  Filled 2019-09-02: qty 1

## 2019-09-02 NOTE — ED Provider Notes (Signed)
[facility] DEPT Provider Note   CSN: [id number] Arrival date & time: 09/02/19  1903     History Chief Complaint  Patient presents with  . Abdominal Pain    Kimberly Mueller is a 34 y.o. female.  34 year old female with history of breast cancer who presents with abdominal comfort x2 days.  Patient's abdominal discomfort is and her left upper and mid epigastric region.  Patient believes that she is constipated and has been taking over-the-counter medication without relief.  States that symptoms become worse after she eats.  She denies any emesis.  No fever or chills.  No vaginal bleeding or discharge.  No urinary symptoms.  Went to the cancer center today for her usual treatment and was sent here for further management.        Past Medical History:  Diagnosis Date  . Cancer [facility])    breast  . Family history of pancreatic cancer   . Family history of prostate cancer     Patient Active Problem List   Diagnosis Date Noted  . Port-A-Cath in place 07/22/2019  . Genetic testing 06/15/2019  . Family history of prostate cancer   . Family history of pancreatic cancer   . Malignant neoplasm of lower-inner quadrant of right breast of female, estrogen receptor positive ([place]) 05/25/2019    Past Surgical History:  Procedure Laterality Date  . GASTRIC BYPASS    . HERNIA REPAIR    . PORTACATH PLACEMENT Right 06/09/2019   Procedure: INSERTION PORT-A-CATH WITH ULTRASOUND;  Surgeon: Rolm Bookbinder, MD;  Location: [place];  Service: General;  Laterality: Right;     OB History   No obstetric history on file.     Family History  Problem Relation Age of Onset  . Prostate cancer Father   . Pancreatic cancer Paternal Grandmother     Social History   Tobacco Use  . Smoking status: Former Research scientist (life sciences)  . Smokeless tobacco: Never Used  Substance Use Topics  . Alcohol use: Never  . Drug use: Never    Home Medications Prior to  Admission medications   Medication Sig Start Date End Date Taking? Authorizing Provider  cyanocobalamin (,VITAMIN B-12,) 1000 MCG/ML injection Inject 1 mL into the muscle every 30 (thirty) days. 08/14/19   [provider]  etonogestrel (NEXPLANON) 68 MG IMPL implant 1 each by Subdermal route once.    [provider]  gabapentin (NEURONTIN) 100 MG capsule Take 1 capsule (100 mg total) by mouth at bedtime. 08/05/19   Nicholas Lose, MD  Lactobacillus (PROBIOTIC ACIDOPHILUS) CAPS Take 1 capsule by mouth daily. 09/01/19   Tanner, Lyndon Code., PA-C  levofloxacin (LEVAQUIN) 500 MG tablet Take 1 tablet (500 mg total) by mouth daily. 08/27/19   Nicholas Lose, MD  lidocaine-prilocaine (EMLA) cream Apply to affected area once 06/05/19   Nicholas Lose, MD  Multiple Vitamin (MULTIVITAMIN) tablet Take 1 tablet by mouth daily.    [provider]  Multiple Vitamins-Minerals (VITAMIN D3 COMPLETE PO) Take 1,000 Units by mouth daily.    [provider]  prochlorperazine (COMPAZINE) 10 MG tablet Take 1 tablet (10 mg total) by mouth every 6 (six) hours as needed (Nausea or vomiting). 06/05/19   Nicholas Lose, MD  vortioxetine HBr (TRINTELLIX) 5 MG TABS tablet Take 5 mg by mouth daily.    [provider]    Allergies    Patient has no known allergies.  Review of Systems   Review of Systems  All other systems  reviewed and are negative.   Physical Exam Updated Vital Signs BP 137/90   Pulse 86   Temp 98.3 F (36.8 C) (Oral)   Resp 18   SpO2 97%   Physical Exam Vitals and nursing note reviewed.  Constitutional:      General: She is not in acute distress.    Appearance: Normal appearance. She is well-developed. She is not toxic-appearing.  HENT:     Head: Normocephalic and atraumatic.  Eyes:     General: Lids are normal.     Conjunctiva/sclera: Conjunctivae normal.     Pupils: Pupils are equal, round, and reactive to light.  Neck:     Thyroid: No thyroid mass.      Trachea: No tracheal deviation.  Cardiovascular:     Rate and Rhythm: Normal rate and regular rhythm.     Heart sounds: Normal heart sounds. No murmur. No gallop.   Pulmonary:     Effort: Pulmonary effort is normal. No respiratory distress.     Breath sounds: Normal breath sounds. No stridor. No decreased breath sounds, wheezing, rhonchi or rales.  Abdominal:     General: Bowel sounds are normal. There is no distension.     Palpations: Abdomen is soft.     Tenderness: There is abdominal tenderness in the epigastric area and left upper quadrant. There is no rebound.    Musculoskeletal:        General: No tenderness. Normal range of motion.     Cervical back: Normal range of motion and neck supple.  Skin:    General: Skin is warm and dry.     Findings: No abrasion or rash.  Neurological:     Mental Status: She is alert and oriented to person, place, and time.     GCS: GCS eye subscore is 4. GCS verbal subscore is 5. GCS motor subscore is 6.     Cranial Nerves: No cranial nerve deficit.     Sensory: No sensory deficit.  Psychiatric:        Speech: Speech normal.        Behavior: Behavior normal.     ED Results / Procedures / Treatments   Labs (all labs ordered are listed, but only abnormal results are displayed) Labs Reviewed  LIPASE, BLOOD  COMPREHENSIVE METABOLIC PANEL  CBC  URINALYSIS, ROUTINE W REFLEX MICROSCOPIC  I-STAT BETA HCG BLOOD, ED ([facility], [facility], [facility] ONLY)    EKG None  Radiology No results found.  Procedures Procedures (including critical care time)  Medications Ordered in ED Medications  sodium chloride flush (NS) 0.9 % injection 3 mL (has no administration in time range)  0.9 %  sodium chloride infusion (has no administration in time range)    ED Course  I have reviewed the triage vital signs and the nursing notes.  Pertinent labs & imaging results that were available during my care of the patient were reviewed by me and considered in my medical decision  making (see chart for details).    MDM Rules/Calculators/A&P                      Patient with make abdominal CT with exception of stool.  Labs are reassuring here and will discharge home Final Clinical Impression(s) / ED Diagnoses Final diagnoses:  None    Rx / DC Orders ED Discharge Orders    None       Lacretia Leigh, MD 09/02/19 2130

## 2019-09-02 NOTE — Patient Instructions (Signed)
[facility] Discharge Instructions for Patients Receiving Chemotherapy  Today you received the following chemotherapy agents: Paclitaxel (Taxol)  To help prevent nausea and vomiting after your treatment, we encourage you to take your nausea medication as prescribed. If you develop nausea and vomiting that is not controlled by your nausea medication, call the clinic.   BELOW ARE SYMPTOMS THAT SHOULD BE REPORTED IMMEDIATELY:  *FEVER GREATER THAN 100.5 F  *CHILLS WITH OR WITHOUT FEVER  NAUSEA AND VOMITING THAT IS NOT CONTROLLED WITH YOUR NAUSEA MEDICATION  *UNUSUAL SHORTNESS OF BREATH  *UNUSUAL BRUISING OR BLEEDING  TENDERNESS IN MOUTH AND THROAT WITH OR WITHOUT PRESENCE OF ULCERS  *URINARY PROBLEMS  *BOWEL PROBLEMS  UNUSUAL RASH Items with * indicate a potential emergency and should be followed up as soon as possible.  Feel free to call the clinic should you have any questions or concerns. The clinic phone number is [phone].  Please show the CHEMO ALERT CARD at check-in to the Emergency Department and triage nurse.   

## 2019-09-02 NOTE — ED Triage Notes (Signed)
Pt reports generalized abdominal pain that worsens with standing straight up. She reports her last BM yesterday morning. She reports that she has tried medications, yoga poses, and exercise without relief. States last time she was constipated, she had a fissure and wants to avoid that.

## 2019-09-02 NOTE — Discharge Instructions (Addendum)
Purchase magnesium citrate to help with your constipation.  Return here for persistent vomiting

## 2019-09-02 NOTE — ED Notes (Signed)
Patient transported to MRI 

## 2019-09-03 ENCOUNTER — Encounter: Payer: Self-pay | Admitting: Gynecologic Oncology

## 2019-09-03 ENCOUNTER — Inpatient Hospital Stay (HOSPITAL_BASED_OUTPATIENT_CLINIC_OR_DEPARTMENT_OTHER): Payer: No Typology Code available for payment source | Admitting: Gynecologic Oncology

## 2019-09-03 ENCOUNTER — Other Ambulatory Visit: Payer: Self-pay

## 2019-09-03 VITALS — BP 105/79 | HR 92 | Temp 98.2°F | Resp 17 | Ht 65.0 in | Wt 141.8 lb

## 2019-09-03 DIAGNOSIS — C50911 Malignant neoplasm of unspecified site of right female breast: Secondary | ICD-10-CM

## 2019-09-03 DIAGNOSIS — C50511 Malignant neoplasm of lower-outer quadrant of right female breast: Secondary | ICD-10-CM | POA: Diagnosis not present

## 2019-09-03 NOTE — Progress Notes (Signed)
Consult Note: Gyn-Onc  Consult was requested by Dr. Lindi Adie for the evaluation of Kimberly Mueller 34 y.o. female  CC:  Chief Complaint  Patient presents with  . Hormone receptor positive malignant neoplasm of right breast    New patient    Assessment/Plan:  Kimberly Mueller  is a 34 y.o.  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  I discussed that options for either goserelin injections or surgical castration with BSO are both reasonable options.  The benefit to surgical castration with BSO is that is definitive with stage II of resolution of estrogen exposure.  However it does involve surgical procedure and risk, albeit low in this particular patient, and result in permanent sterility.  She is leaning towards surgical approach.  She will have her Implanon removed by her OB/GYN and initiate goserelin injections while she completes her chemotherapy, surgery for her breast cancer, and radiation.  I explained that it would be reasonable to consider BSO approximately 1 month after she is completed radiation which will likely be in the early fall or late summer.  She will return to see me in June 2021 for surgical planning.  HPI: [name] is a very pleasant 34 year old G0 who was seen in consultation at the request of Dr Lindi Adie for evaluation of hormone receptor positive breast cancer desiring chemical versus surgical castration.  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  Treatment plan was initiated with neoadjuvant chemotherapy with Adriamycin Cytoxan and Taxol.  She is currently undergoing the Taxol portion of this.  Plan is for surgical excision followed by radiation after chemotherapy.  She underwent genetic testing on June 15, 2019 which was negative for BRCA mutations.  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  The patient is otherwise  very healthy.  She has a history of obesity which was treated with a laparoscopic gastric bypass in [place] in 2020.  She lost 90 pounds after this.  She is of normal body weight with a BMI of 23.  The patient has had no other prior abdominal surgeries besides this gastric bypass.  She has never been pregnant.  She is somewhat ambivalent about children but decided after her breast cancer diagnosis that she would unlikely ever try for pregnancy given the hormone receptor positive nature of her breast cancer.  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  She has been vaccinated against HPV.  Current Meds:  Outpatient Encounter Medications as of 09/03/2019  Medication Sig  . docusate sodium (COLACE) 250 MG capsule Take 250 mg by mouth 2 (two) times daily.  Marland Kitchen etonogestrel (NEXPLANON) 68 MG IMPL implant 1 each by Subdermal route once.  . gabapentin (NEURONTIN) 100 MG capsule Take 1 capsule (100 mg total) by mouth at bedtime.  . Lactobacillus (PROBIOTIC ACIDOPHILUS) CAPS Take 1 capsule by mouth daily.  Marland Kitchen lidocaine-prilocaine (EMLA) cream Apply to affected area once  . prochlorperazine (COMPAZINE) 10 MG tablet Take 1 tablet (10 mg total) by mouth every 6 (six) hours as needed (Nausea or vomiting).  . Sennosides (LAXATIVE) 25 MG TABS Take 1 tablet by mouth 2 (two) times daily as needed (constipation).  . simethicone (MYLICON) 80 MG chewable tablet Chew 80 mg by mouth every 6 (six) hours as needed for  flatulence.  . [DISCONTINUED] cyanocobalamin (,VITAMIN B-12,) 1000 MCG/ML injection Inject 1 mL into the muscle every 30 (thirty) days.  . [DISCONTINUED] levofloxacin (LEVAQUIN) 500 MG tablet Take 1 tablet (500 mg total) by mouth daily. (Patient not taking: Reported on 09/02/2019)  . [DISCONTINUED] Multiple Vitamin (MULTIVITAMIN) tablet Take 1 tablet by mouth daily.  . [DISCONTINUED] Multiple  Vitamins-Minerals (VITAMIN D3 COMPLETE PO) Take 1,000 Units by mouth daily.  . [DISCONTINUED] vortioxetine HBr (TRINTELLIX) 5 MG TABS tablet Take 5 mg by mouth daily.  . [DISCONTINUED] sodium chloride flush (NS) 0.9 % injection 10 mL    No facility-administered encounter medications on file as of 09/03/2019.    Allergy: No Known Allergies  Social Hx:   Social History   Socioeconomic History  . Marital status: Married    Spouse name: Not on file  . Number of children: Not on file  . Years of education: Not on file  . Highest education level: Not on file  Occupational History  . Not on file  Tobacco Use  . Smoking status: Former Research scientist (life sciences)  . Smokeless tobacco: Never Used  Substance and Sexual Activity  . Alcohol use: Never  . Drug use: Never  . Sexual activity: Not on file  Other Topics Concern  . Not on file  Social History Narrative  . Not on file   Social Determinants of Health   Financial Resource Strain:   . Difficulty of Paying Living Expenses: Not on file  Food Insecurity:   . Worried About Charity fundraiser in the Last Year: Not on file  . Ran Out of Food in the Last Year: Not on file  Transportation Needs:   . Lack of Transportation (Medical): Not on file  . Lack of Transportation (Non-Medical): Not on file  Physical Activity:   . Days of Exercise per Week: Not on file  . Minutes of Exercise per Session: Not on file  Stress:   . Feeling of Stress : Not on file  Social Connections:   . Frequency of Communication with Friends and Family: Not on file  . Frequency of Social Gatherings with Friends and Family: Not on file  . Attends Religious Services: Not on file  . Active Member of Clubs or Organizations: Not on file  . Attends Archivist Meetings: Not on file  . Marital Status: Not on file  Intimate Partner Violence:   . Fear of Current or Ex-Partner: Not on file  . Emotionally Abused: Not on file  . Physically Abused: Not on file  . Sexually  Abused: Not on file    Past Surgical Hx:  Past Surgical History:  Procedure Laterality Date  . GASTRIC BYPASS    . HERNIA REPAIR    . PORTACATH PLACEMENT Right 06/09/2019   Procedure: INSERTION PORT-A-CATH WITH ULTRASOUND;  Surgeon: Rolm Bookbinder, MD;  Location: [facility];  Service: General;  Laterality: Right;    Past Medical Hx:  Past Medical History:  Diagnosis Date  . Breast cancer ([place])   . Cancer [facility])    breast  . Family history of pancreatic cancer   . Family history of prostate cancer     Past Gynecological History:   No LMP recorded. Patient has had an implant.  Family Hx:  Family History  Problem Relation Age of Onset  . Prostate cancer Father   . Pancreatic cancer Paternal Grandmother     Review of Systems:  Constitutional  Feels well,    ENT Normal  appearing ears and nares bilaterally Skin/Breast  No rash, sores, jaundice, itching, dryness Cardiovascular  No chest pain, shortness of breath, or edema  Pulmonary  No cough or wheeze.  Gastro Intestinal  No nausea, vomitting, or diarrhoea. No bright red blood per rectum, no abdominal pain, change in bowel movement, +constipation.  Genito Urinary  No frequency, urgency, dysuria, no bleeding Musculo Skeletal  No myalgia, arthralgia, joint swelling or pain  Neurologic  No weakness, numbness, change in gait,  Psychology  No depression, anxiety, insomnia.   Vitals:  Blood pressure 105/79, pulse 92, temperature 98.2 F (36.8 C), temperature source Temporal, resp. rate 17, height '5\' 5"'  (1.651 m), weight 141 lb 12.8 oz (64.3 kg), SpO2 99 %.  Physical Exam: WD in NAD Neck  Supple NROM, without any enlargements.  Lymph Node Survey No cervical supraclavicular or inguinal adenopathy Cardiovascular  Pulse normal rate, regularity and rhythm. S1 and S2 normal.  Lungs  Clear to auscultation bilateraly, without wheezes/crackles/rhonchi. Good air movement.  Skin  No rash/lesions/breakdown   Psychiatry  Alert and oriented to person, place, and time  Abdomen  Normoactive bowel sounds, abdomen soft, non-tender and nonobese without evidence of hernia.  Back No CVA tenderness Genito Urinary  Vulva/vagina: Normal external female genitalia.  No lesions. No discharge or bleeding.  Bladder/urethra:  No lesions or masses, well supported bladder  Vagina: normal, narrow, atrophic  Cervix: Normal appearing, no lesions.  Uterus:  Small, mobile, no parametrial involvement or nodularity.  Adnexa: no palpable masses. Rectal  deferred Extremities  No bilateral cyanosis, clubbing or edema.   Thereasa Solo, MD  09/03/2019, 12:33 PM

## 2019-09-03 NOTE — Patient Instructions (Signed)
Dr Denman George is offering removal of both tubes and ovaries or removal of both tubes and ovaries.  An alternative is to have goserelin shots monthly until you transition to menopause.  Dr Denman George will see you back in June after you are nearing the end of your treatments.  You surgery can likely be performed through small incisions as an outpatient procedure.

## 2019-09-08 ENCOUNTER — Encounter: Payer: Self-pay | Admitting: *Deleted

## 2019-09-09 ENCOUNTER — Inpatient Hospital Stay: Payer: No Typology Code available for payment source

## 2019-09-09 ENCOUNTER — Inpatient Hospital Stay (HOSPITAL_BASED_OUTPATIENT_CLINIC_OR_DEPARTMENT_OTHER): Payer: No Typology Code available for payment source | Admitting: Hematology and Oncology

## 2019-09-09 ENCOUNTER — Inpatient Hospital Stay: Payer: No Typology Code available for payment source | Attending: Hematology and Oncology

## 2019-09-09 ENCOUNTER — Other Ambulatory Visit: Payer: Self-pay

## 2019-09-09 DIAGNOSIS — R748 Abnormal levels of other serum enzymes: Secondary | ICD-10-CM | POA: Diagnosis not present

## 2019-09-09 DIAGNOSIS — Z800 Family history of malignant neoplasm of digestive organs: Secondary | ICD-10-CM | POA: Diagnosis not present

## 2019-09-09 DIAGNOSIS — M791 Myalgia, unspecified site: Secondary | ICD-10-CM | POA: Diagnosis not present

## 2019-09-09 DIAGNOSIS — Z170 Estrogen receptor positive status [ER+]: Secondary | ICD-10-CM

## 2019-09-09 DIAGNOSIS — C50311 Malignant neoplasm of lower-inner quadrant of right female breast: Secondary | ICD-10-CM | POA: Insufficient documentation

## 2019-09-09 DIAGNOSIS — Z87891 Personal history of nicotine dependence: Secondary | ICD-10-CM | POA: Insufficient documentation

## 2019-09-09 DIAGNOSIS — Z79899 Other long term (current) drug therapy: Secondary | ICD-10-CM | POA: Insufficient documentation

## 2019-09-09 DIAGNOSIS — E876 Hypokalemia: Secondary | ICD-10-CM | POA: Diagnosis not present

## 2019-09-09 DIAGNOSIS — D72819 Decreased white blood cell count, unspecified: Secondary | ICD-10-CM | POA: Diagnosis not present

## 2019-09-09 DIAGNOSIS — R197 Diarrhea, unspecified: Secondary | ICD-10-CM | POA: Insufficient documentation

## 2019-09-09 DIAGNOSIS — Z5111 Encounter for antineoplastic chemotherapy: Secondary | ICD-10-CM | POA: Insufficient documentation

## 2019-09-09 DIAGNOSIS — K5900 Constipation, unspecified: Secondary | ICD-10-CM | POA: Diagnosis not present

## 2019-09-09 DIAGNOSIS — Z8042 Family history of malignant neoplasm of prostate: Secondary | ICD-10-CM | POA: Diagnosis not present

## 2019-09-09 DIAGNOSIS — R5383 Other fatigue: Secondary | ICD-10-CM | POA: Diagnosis not present

## 2019-09-09 DIAGNOSIS — Z95828 Presence of other vascular implants and grafts: Secondary | ICD-10-CM

## 2019-09-09 DIAGNOSIS — G629 Polyneuropathy, unspecified: Secondary | ICD-10-CM | POA: Diagnosis not present

## 2019-09-09 DIAGNOSIS — Z803 Family history of malignant neoplasm of breast: Secondary | ICD-10-CM | POA: Insufficient documentation

## 2019-09-09 LAB — CBC WITH DIFFERENTIAL (CANCER CENTER ONLY)
Abs Immature Granulocytes: 0.02 10*3/uL (ref 0.00–0.07)
Basophils Absolute: 0 10*3/uL (ref 0.0–0.1)
Basophils Relative: 1 %
Eosinophils Absolute: 0.1 10*3/uL (ref 0.0–0.5)
Eosinophils Relative: 4 %
HCT: 32.1 % — ABNORMAL LOW (ref 36.0–46.0)
Hemoglobin: 10.8 g/dL — ABNORMAL LOW (ref 12.0–15.0)
Immature Granulocytes: 1 %
Lymphocytes Relative: 17 %
Lymphs Abs: 0.6 10*3/uL — ABNORMAL LOW (ref 0.7–4.0)
MCH: 32.3 pg (ref 26.0–34.0)
MCHC: 33.6 g/dL (ref 30.0–36.0)
MCV: 96.1 fL (ref 80.0–100.0)
Monocytes Absolute: 0.3 10*3/uL (ref 0.1–1.0)
Monocytes Relative: 9 %
Neutro Abs: 2.4 10*3/uL (ref 1.7–7.7)
Neutrophils Relative %: 68 %
Platelet Count: 284 10*3/uL (ref 150–400)
RBC: 3.34 MIL/uL — ABNORMAL LOW (ref 3.87–5.11)
RDW: 15.9 % — ABNORMAL HIGH (ref 11.5–15.5)
WBC Count: 3.4 10*3/uL — ABNORMAL LOW (ref 4.0–10.5)
nRBC: 0 % (ref 0.0–0.2)

## 2019-09-09 LAB — CMP (CANCER CENTER ONLY)
ALT: 56 U/L — ABNORMAL HIGH (ref 0–44)
AST: 46 U/L — ABNORMAL HIGH (ref 15–41)
Albumin: 4.2 g/dL (ref 3.5–5.0)
Alkaline Phosphatase: 68 U/L (ref 38–126)
Anion gap: 9 (ref 5–15)
BUN: 8 mg/dL (ref 6–20)
CO2: 23 mmol/L (ref 22–32)
Calcium: 9.3 mg/dL (ref 8.9–10.3)
Chloride: 108 mmol/L (ref 98–111)
Creatinine: 0.65 mg/dL (ref 0.44–1.00)
GFR, Est AFR Am: >60 mL/min (ref >60)
GFR, Estimated: >60 mL/min (ref >60)
Glucose, Bld: 92 mg/dL (ref 70–99)
Potassium: 3.7 mmol/L (ref 3.5–5.1)
Sodium: 140 mmol/L (ref 135–145)
Total Bilirubin: 0.8 mg/dL (ref 0.3–1.2)
Total Protein: 6.7 g/dL (ref 6.5–8.1)

## 2019-09-09 LAB — PREGNANCY, URINE: Preg Test, Ur: NEGATIVE

## 2019-09-09 MED ORDER — SODIUM CHLORIDE 0.9 % IV SOLN
80.0000 mg/m2 | Freq: Once | INTRAVENOUS | Status: AC
  Administered 2019-09-09: 138 mg via INTRAVENOUS
  Filled 2019-09-09: qty 23

## 2019-09-09 MED ORDER — FAMOTIDINE IN NACL 20-0.9 MG/50ML-% IV SOLN
INTRAVENOUS | Status: AC
  Filled 2019-09-09: qty 50

## 2019-09-09 MED ORDER — DEXAMETHASONE SODIUM PHOSPHATE 10 MG/ML IJ SOLN
4.0000 mg | Freq: Once | INTRAMUSCULAR | Status: AC
  Administered 2019-09-09: 4 mg via INTRAVENOUS

## 2019-09-09 MED ORDER — SODIUM CHLORIDE 0.9% FLUSH
10.0000 mL | INTRAVENOUS | Status: DC | PRN
  Administered 2019-09-09: 10 mL
  Filled 2019-09-09: qty 10

## 2019-09-09 MED ORDER — DEXAMETHASONE SODIUM PHOSPHATE 10 MG/ML IJ SOLN
INTRAMUSCULAR | Status: AC
  Filled 2019-09-09: qty 1

## 2019-09-09 MED ORDER — DIPHENHYDRAMINE HCL 50 MG/ML IJ SOLN
25.0000 mg | Freq: Once | INTRAMUSCULAR | Status: AC
  Administered 2019-09-09: 25 mg via INTRAVENOUS

## 2019-09-09 MED ORDER — FAMOTIDINE IN NACL 20-0.9 MG/50ML-% IV SOLN
20.0000 mg | Freq: Once | INTRAVENOUS | Status: AC
  Administered 2019-09-09: 20 mg via INTRAVENOUS

## 2019-09-09 MED ORDER — DIPHENHYDRAMINE HCL 50 MG/ML IJ SOLN
INTRAMUSCULAR | Status: AC
  Filled 2019-09-09: qty 1

## 2019-09-09 MED ORDER — HEPARIN SOD (PORK) LOCK FLUSH 100 UNIT/ML IV SOLN
500.0000 [IU] | Freq: Once | INTRAVENOUS | Status: AC | PRN
  Administered 2019-09-09: 500 [IU]
  Filled 2019-09-09: qty 5

## 2019-09-09 MED ORDER — SODIUM CHLORIDE 0.9 % IV SOLN
Freq: Once | INTRAVENOUS | Status: AC
  Filled 2019-09-09: qty 250

## 2019-09-09 NOTE — Progress Notes (Signed)
Patient Care Team: Jilda Panda, MD as PCP - General (Internal Medicine) Nicholas Lose, MD as Consulting Physician (Hematology and Oncology) Rolm Bookbinder, MD as Consulting Physician (General Surgery) Gery Pray, MD as Consulting Physician (Radiation Oncology) Rockwell Germany, RN as Oncology Nurse Navigator Mauro Kaufmann, RN as Oncology Nurse Navigator  DIAGNOSIS:    ICD-10-CM   1. Malignant neoplasm of lower-inner quadrant of right breast of female, estrogen receptor positive ([place])  C50.311    Z17.0     SUMMARY OF ONCOLOGIC HISTORY: Oncology History  Malignant neoplasm of lower-inner quadrant of right breast of female, estrogen receptor positive ([name])  05/25/2019 Initial Diagnosis   Patient palpated a right breast lump for two years. Diagnostic mammogram showed a 4.5cm right breast mass at the 6 o'clock position involving the overlying skin, 2 benign appearing left breast mass at the 10 o'clock position measuring 2.4cm and 2.1cm, and no axillary adenopathy. Biopsy showed: in the left breast, fibroadenoma and no evidence of malignancy, and in the right breast, IDC with DCIS, grade 3.  ER 70%, PR 30%, Ki-67 40%, HER-2 negative by [facility]    06/10/2019 -  Chemotherapy   The patient had DOXOrubicin (ADRIAMYCIN) chemo injection 118 mg, 60 mg/m2 = 118 mg, Intravenous,  Once, 4 of 4 cycles Dose modification: 50 mg/m2 (original dose 60 mg/m2, Cycle 2, Reason: Dose not tolerated) Administration: 118 mg (06/10/2019), 98 mg (06/24/2019), 98 mg (07/08/2019), 98 mg (07/22/2019) palonosetron (ALOXI) injection 0.25 mg, 0.25 mg, Intravenous,  Once, 4 of 4 cycles Administration: 0.25 mg (06/10/2019), 0.25 mg (06/24/2019), 0.25 mg (07/08/2019), 0.25 mg (07/22/2019) pegfilgrastim-cbqv (UDENYCA) injection 6 mg, 6 mg, Subcutaneous, Once, 4 of 4 cycles Administration: 6 mg (06/12/2019), 6 mg (06/26/2019), 6 mg (07/10/2019), 6 mg (07/24/2019) cyclophosphamide (CYTOXAN) 1,180 mg in sodium chloride 0.9 % 250  mL chemo infusion, 600 mg/m2 = 1,180 mg, Intravenous,  Once, 4 of 4 cycles Dose modification: 500 mg/m2 (original dose 600 mg/m2, Cycle 2, Reason: Dose not tolerated) Administration: 1,180 mg (06/10/2019), 980 mg (06/24/2019), 980 mg (07/08/2019), 980 mg (07/22/2019) PACLitaxel (TAXOL) 156 mg in sodium chloride 0.9 % 250 mL chemo infusion (</= 73m/m2), 80 mg/m2 = 156 mg, Intravenous,  Once, 4 of 12 cycles Administration: 156 mg (08/12/2019), 156 mg (08/19/2019), 138 mg (08/26/2019), 138 mg (09/02/2019) fosaprepitant (EMEND) 150 mg, dexamethasone (DECADRON) 12 mg in sodium chloride 0.9 % 145 mL IVPB, , Intravenous,  Once, 4 of 4 cycles Administration:  (06/10/2019),  (06/24/2019),  (07/08/2019),  (07/22/2019)  for chemotherapy treatment.     Genetic Testing   No pathogenic variants identified. VUS in DWheelercalled c.2378A>G identified on the [facility] Common Hereditary Cancers Panel. The report date is 06/15/2019.  The Common Hereditary Cancers Panel offered by [facility] includes sequencing and/or deletion duplication testing of the following 48 genes: APC, ATM, AXIN2, BARD1, BMPR1A, BRCA1, BRCA2, BRIP1, CDH1, CDKN2A (p14ARF), CDKN2A (p16INK4a), CKD4, CHEK2, CTNNA1, DICER1, EPCAM (Deletion/duplication testing only), GREM1 (promoter region deletion/duplication testing only), KIT, MEN1, MLH1, MSH2, MSH3, MSH6, MUTYH, NBN, NF1, NHTL1, PALB2, PDGFRA, PMS2, POLD1, POLE, PTEN, RAD50, RAD51C, RAD51D, RNF43, SDHB, SDHC, SDHD, SMAD4, SMARCA4. STK11, TP53, TSC1, TSC2, and VHL.  The following genes were evaluated for sequence changes only: SDHA and HOXB13 c.251G>A variant only.     CHIEF COMPLIANT: Cycle 5 Taxol  INTERVAL HISTORY: Kimberly Mueller a 34y.o. with above-mentioned history of right breast cancer currently on neoadjuvant chemotherapy with weekly Taxol after she completed 4 cycle of Adriamycin and Cytoxan.She presents to the  clinic todayfor cycle5.Her major complaint is constipation for which she had even  gone to the emergency room because she thought she had obstructed.  She has been there is taking a stool softener with Colace and as well as magnesium citrate.  It apparently causes diarrhea intermittently.  She denies any nausea vomiting denies neuropathy.  She does have myalgias for which she has been taking gabapentin which appears to be helping her.  ALLERGIES:  has No Known Allergies.  MEDICATIONS:  Current Outpatient Medications  Medication Sig Dispense Refill  . docusate sodium (COLACE) 250 MG capsule Take 250 mg by mouth 2 (two) times daily.    Marland Kitchen etonogestrel (NEXPLANON) 68 MG IMPL implant 1 each by Subdermal route once.    . gabapentin (NEURONTIN) 100 MG capsule Take 1 capsule (100 mg total) by mouth at bedtime. 30 capsule 3  . Lactobacillus (PROBIOTIC ACIDOPHILUS) CAPS Take 1 capsule by mouth daily. 30 capsule 0  . lidocaine-prilocaine (EMLA) cream Apply to affected area once 30 g 3  . prochlorperazine (COMPAZINE) 10 MG tablet Take 1 tablet (10 mg total) by mouth every 6 (six) hours as needed (Nausea or vomiting). 30 tablet 1  . Sennosides (LAXATIVE) 25 MG TABS Take 1 tablet by mouth 2 (two) times daily as needed (constipation).    . simethicone (MYLICON) 80 MG chewable tablet Chew 80 mg by mouth every 6 (six) hours as needed for flatulence.     No current facility-administered medications for this visit.    PHYSICAL EXAMINATION: ECOG PERFORMANCE STATUS: 1 - Symptomatic but completely ambulatory  Vitals:   09/09/19 1336  BP: 104/72  Pulse: 85  Resp: 17  Temp: 99.3 F (37.4 C)  SpO2: 100%   Filed Weights   09/09/19 1336  Weight: 141 lb (64 kg)    LABORATORY DATA:  I have reviewed the data as listed CMP Latest Ref Rng & Units 09/02/2019 08/26/2019 08/19/2019  Glucose 70 - 99 mg/dL 88 95 94  BUN 6 - 20 mg/dL 7 7 <4(L)  Creatinine 0.44 - 1.00 mg/dL 0.61 0.64 0.60  Sodium 135 - 145 mmol/L 141 140 140  Potassium 3.5 - 5.1 mmol/L 3.6 3.6 3.9  Chloride 98 - 111 mmol/L 108  109 107  CO2 22 - 32 mmol/L '23 22 23  ' Calcium 8.9 - 10.3 mg/dL 9.2 9.2 9.1  Total Protein 6.5 - 8.1 g/dL 6.6 6.5 6.5  Total Bilirubin 0.3 - 1.2 mg/dL 0.9 0.8 0.7  Alkaline Phos 38 - 126 U/L 65 59 72  AST 15 - 41 U/L 60(H) 66(H) 72(H)  ALT 0 - 44 U/L 80(H) 89(H) 88(H)    Lab Results  Component Value Date   WBC 3.4 (L) 09/09/2019   HGB 10.8 (L) 09/09/2019   HCT 32.1 (L) 09/09/2019   MCV 96.1 09/09/2019   PLT 284 09/09/2019   NEUTROABS 2.4 09/09/2019    ASSESSMENT & PLAN:  Malignant neoplasm of lower-inner quadrant of right breast of female, estrogen receptor positive ([place]) 05/15/2019:Patient palpated a right breast lump for two years. Diagnostic mammogram showed a 4.5cm right breast mass at the 6 o'clock position involving the overlying skin, 2 benign appearing left breast mass at the 10 o'clock position measuring 2.4cm and 2.1cm, and no axillary adenopathy. 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%, HER-2 negative ratio 1.41 T2 N0 stage IIa clinical stage  Treatment plan: 1.Neoadjuvant chemotherapywith  dose dense Adriamycin and Cytoxan x4 followed by Taxol x12starting 06/10/2019 2.Breast conserving surgery with sentinel lymph node biopsy 3.Adjuvant radiation 4.Follow-up adjuvant antiestrogen therapy(patient may be opting for oophorectomy plus anastrozole) ----------------------------------------------------------------------------------------------------------------------------------------------- Current treatment:Completed 4 cycles ofdose dense Adriamycin and Cytoxan, today cycle 5 Taxol Echocardiogram: 06/05/2019: EF 55 to 60% normal LV function  Chemo toxicities: 1.  Fatigue 2.  Elevation of LFTs: Monitoring the liver function tests. 3.  Hypokalemia on potassium supplement  Liver biopsy: Benign 07/02/2019: MRI left femur: Benign  Return to clinic  weekly for Taxol and every other week follow-up with me.  No orders of the defined types were placed in this encounter.  The patient has a good understanding of the overall plan. she agrees with it. she will call with any problems that may develop before the next visit here.  Total time spent: 30 mins including face to face time and time spent for planning, charting and coordination of care  Nicholas Lose, MD 09/09/2019  I, Cloyde Reams Dorshimer, am acting as scribe for Dr. Nicholas Lose.  I have reviewed the above documentation for accuracy and completeness, and I agree with the above.

## 2019-09-09 NOTE — Assessment & Plan Note (Signed)
05/15/2019:Patient palpated a right breast lump for two years. Diagnostic mammogram showed a 4.5cm right breast mass at the 6 o'clock position involving the overlying skin, 2 benign appearing left breast mass at the 10 o'clock position measuring 2.4cm and 2.1cm, and no axillary adenopathy. 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%, HER-2 negative ratio 1.41 T2 N0 stage IIa clinical stage  Treatment plan: 1.Neoadjuvant chemotherapywith dose dense Adriamycin and Cytoxan x4 followed by Taxol x12starting 06/10/2019 2.Breast conserving surgery with sentinel lymph node biopsy 3.Adjuvant radiation 4.Follow-up adjuvant antiestrogen therapy(patient may be opting for oophorectomy plus anastrozole) ----------------------------------------------------------------------------------------------------------------------------------------------- Current treatment:Completed 4 cycles ofdose dense Adriamycin and Cytoxan, today cycle 5 Taxol Echocardiogram: 06/05/2019: EF 55 to 60% normal LV function  Chemo toxicities: 1.  Fatigue 2.  Elevation of LFTs 3.  Hypokalemia on potassium supplement  Liver biopsy: Benign 07/02/2019: MRI left femur: Benign  Return to clinic weekly for Taxol and every other week follow-up with me.

## 2019-09-09 NOTE — Patient Instructions (Signed)
[facility] Discharge Instructions for Patients Receiving Chemotherapy  Today you received the following chemotherapy agents: paclitaxel.  To help prevent nausea and vomiting after your treatment, we encourage you to take your nausea medication as directed.   If you develop nausea and vomiting that is not controlled by your nausea medication, call the clinic.   BELOW ARE SYMPTOMS THAT SHOULD BE REPORTED IMMEDIATELY:  *FEVER GREATER THAN 100.5 F  *CHILLS WITH OR WITHOUT FEVER  NAUSEA AND VOMITING THAT IS NOT CONTROLLED WITH YOUR NAUSEA MEDICATION  *UNUSUAL SHORTNESS OF BREATH  *UNUSUAL BRUISING OR BLEEDING  TENDERNESS IN MOUTH AND THROAT WITH OR WITHOUT PRESENCE OF ULCERS  *URINARY PROBLEMS  *BOWEL PROBLEMS  UNUSUAL RASH Items with * indicate a potential emergency and should be followed up as soon as possible.  Feel free to call the clinic should you have any questions or concerns. The clinic phone number is [phone].  Please show the CHEMO ALERT CARD at check-in to the Emergency Department and triage nurse.   

## 2019-09-16 ENCOUNTER — Inpatient Hospital Stay: Payer: No Typology Code available for payment source

## 2019-09-16 ENCOUNTER — Other Ambulatory Visit: Payer: Self-pay

## 2019-09-16 VITALS — BP 108/74 | HR 77 | Temp 99.1°F | Resp 18 | Wt 139.2 lb

## 2019-09-16 DIAGNOSIS — C50311 Malignant neoplasm of lower-inner quadrant of right female breast: Secondary | ICD-10-CM

## 2019-09-16 DIAGNOSIS — Z95828 Presence of other vascular implants and grafts: Secondary | ICD-10-CM

## 2019-09-16 DIAGNOSIS — Z5111 Encounter for antineoplastic chemotherapy: Secondary | ICD-10-CM | POA: Diagnosis not present

## 2019-09-16 LAB — CBC WITH DIFFERENTIAL (CANCER CENTER ONLY)
Abs Immature Granulocytes: 0.01 10*3/uL (ref 0.00–0.07)
Basophils Absolute: 0 10*3/uL (ref 0.0–0.1)
Basophils Relative: 1 %
Eosinophils Absolute: 0.1 10*3/uL (ref 0.0–0.5)
Eosinophils Relative: 6 %
HCT: 30.4 % — ABNORMAL LOW (ref 36.0–46.0)
Hemoglobin: 10.1 g/dL — ABNORMAL LOW (ref 12.0–15.0)
Immature Granulocytes: 0 %
Lymphocytes Relative: 21 %
Lymphs Abs: 0.5 10*3/uL — ABNORMAL LOW (ref 0.7–4.0)
MCH: 31.9 pg (ref 26.0–34.0)
MCHC: 33.2 g/dL (ref 30.0–36.0)
MCV: 95.9 fL (ref 80.0–100.0)
Monocytes Absolute: 0.3 10*3/uL (ref 0.1–1.0)
Monocytes Relative: 14 %
Neutro Abs: 1.4 10*3/uL — ABNORMAL LOW (ref 1.7–7.7)
Neutrophils Relative %: 58 %
Platelet Count: 229 10*3/uL (ref 150–400)
RBC: 3.17 MIL/uL — ABNORMAL LOW (ref 3.87–5.11)
RDW: 14.5 % (ref 11.5–15.5)
WBC Count: 2.3 10*3/uL — ABNORMAL LOW (ref 4.0–10.5)
nRBC: 0 % (ref 0.0–0.2)

## 2019-09-16 LAB — CMP (CANCER CENTER ONLY)
ALT: 49 U/L — ABNORMAL HIGH (ref 0–44)
AST: 47 U/L — ABNORMAL HIGH (ref 15–41)
Albumin: 3.9 g/dL (ref 3.5–5.0)
Alkaline Phosphatase: 62 U/L (ref 38–126)
Anion gap: 9 (ref 5–15)
BUN: 5 mg/dL — ABNORMAL LOW (ref 6–20)
CO2: 25 mmol/L (ref 22–32)
Calcium: 9.1 mg/dL (ref 8.9–10.3)
Chloride: 109 mmol/L (ref 98–111)
Creatinine: 0.58 mg/dL (ref 0.44–1.00)
GFR, Est AFR Am: >60 mL/min (ref >60)
GFR, Estimated: >60 mL/min (ref >60)
Glucose, Bld: 82 mg/dL (ref 70–99)
Potassium: 3.6 mmol/L (ref 3.5–5.1)
Sodium: 143 mmol/L (ref 135–145)
Total Bilirubin: 0.7 mg/dL (ref 0.3–1.2)
Total Protein: 6.4 g/dL — ABNORMAL LOW (ref 6.5–8.1)

## 2019-09-16 LAB — PREGNANCY, URINE: Preg Test, Ur: NEGATIVE

## 2019-09-16 MED ORDER — SODIUM CHLORIDE 0.9 % IV SOLN
80.0000 mg/m2 | Freq: Once | INTRAVENOUS | Status: AC
  Administered 2019-09-16: 138 mg via INTRAVENOUS
  Filled 2019-09-16: qty 23

## 2019-09-16 MED ORDER — DEXAMETHASONE SODIUM PHOSPHATE 10 MG/ML IJ SOLN
4.0000 mg | Freq: Once | INTRAMUSCULAR | Status: AC
  Administered 2019-09-16: 4 mg via INTRAVENOUS

## 2019-09-16 MED ORDER — DEXAMETHASONE SODIUM PHOSPHATE 10 MG/ML IJ SOLN
INTRAMUSCULAR | Status: AC
  Filled 2019-09-16: qty 1

## 2019-09-16 MED ORDER — HEPARIN SOD (PORK) LOCK FLUSH 100 UNIT/ML IV SOLN
500.0000 [IU] | Freq: Once | INTRAVENOUS | Status: AC | PRN
  Administered 2019-09-16: 500 [IU]
  Filled 2019-09-16: qty 5

## 2019-09-16 MED ORDER — SODIUM CHLORIDE 0.9% FLUSH
10.0000 mL | INTRAVENOUS | Status: DC | PRN
  Administered 2019-09-16: 10 mL
  Filled 2019-09-16: qty 10

## 2019-09-16 MED ORDER — SODIUM CHLORIDE 0.9 % IV SOLN
Freq: Once | INTRAVENOUS | Status: AC
  Filled 2019-09-16: qty 250

## 2019-09-16 MED ORDER — FAMOTIDINE IN NACL 20-0.9 MG/50ML-% IV SOLN
INTRAVENOUS | Status: AC
  Filled 2019-09-16: qty 50

## 2019-09-16 MED ORDER — DIPHENHYDRAMINE HCL 50 MG/ML IJ SOLN
INTRAMUSCULAR | Status: AC
  Filled 2019-09-16: qty 1

## 2019-09-16 MED ORDER — FAMOTIDINE IN NACL 20-0.9 MG/50ML-% IV SOLN
20.0000 mg | Freq: Once | INTRAVENOUS | Status: AC
  Administered 2019-09-16: 20 mg via INTRAVENOUS

## 2019-09-16 MED ORDER — DIPHENHYDRAMINE HCL 50 MG/ML IJ SOLN
25.0000 mg | Freq: Once | INTRAMUSCULAR | Status: AC
  Administered 2019-09-16: 25 mg via INTRAVENOUS

## 2019-09-16 NOTE — Progress Notes (Signed)
Nutrition Follow-up:  Patient with right breast cancer, receiving taxol.  [place]  Met with patient during infusion.  Patient reports appetite is better now while on taxol.  Reports eating greek yogurt, tuna salad, nuts, egg whites, vegetables.  Only able to drink 1/2 premier protein shake.   Reports dry mouth. Patient reports trying to eat q 2 hours. Patient reports that she feels better when she eats more.     Medications: reviewed  Labs: reviewed  Anthropometrics:   Weight 139 lb on 3/10 decreased from 146 lb on 2/17   NUTRITION DIAGNOSIS: Unintentional weight loss continues    INTERVENTION:  Encouraged patient to liberalize diet for increased calories and protein to prevent weight loss. Patient to try to get in 1 premier protein shake daily. Continue eating q 2 hours Patient has contact information    MONITORING, EVALUATION, GOAL: Patient will continue to meet nutritional needs with gastric bypass and receiving treatment for breast cancer   NEXT VISIT: March 31 during infusion  Kimberly Mueller B. Zenia Resides, [place], [place] Registered Dietitian [phone] (pager)

## 2019-09-16 NOTE — Progress Notes (Signed)
Okay to proceed with treatment for D1C6 Taxol with ANC of 1.4 per Dr. Jana Hakim.

## 2019-09-16 NOTE — Patient Instructions (Signed)
[facility] Discharge Instructions for Patients Receiving Chemotherapy  Today you received the following chemotherapy agents: paclitaxel.  To help prevent nausea and vomiting after your treatment, we encourage you to take your nausea medication as directed.   If you develop nausea and vomiting that is not controlled by your nausea medication, call the clinic.   BELOW ARE SYMPTOMS THAT SHOULD BE REPORTED IMMEDIATELY:  *FEVER GREATER THAN 100.5 F  *CHILLS WITH OR WITHOUT FEVER  NAUSEA AND VOMITING THAT IS NOT CONTROLLED WITH YOUR NAUSEA MEDICATION  *UNUSUAL SHORTNESS OF BREATH  *UNUSUAL BRUISING OR BLEEDING  TENDERNESS IN MOUTH AND THROAT WITH OR WITHOUT PRESENCE OF ULCERS  *URINARY PROBLEMS  *BOWEL PROBLEMS  UNUSUAL RASH Items with * indicate a potential emergency and should be followed up as soon as possible.  Feel free to call the clinic should you have any questions or concerns. The clinic phone number is [phone].  Please show the CHEMO ALERT CARD at check-in to the Emergency Department and triage nurse.   

## 2019-09-22 NOTE — Progress Notes (Signed)
Patient Care Team: Jilda Panda, MD as PCP - General (Internal Medicine) Nicholas Lose, MD as Consulting Physician (Hematology and Oncology) Rolm Bookbinder, MD as Consulting Physician (General Surgery) Gery Pray, MD as Consulting Physician (Radiation Oncology) Rockwell Germany, RN as Oncology Nurse Navigator Mauro Kaufmann, RN as Oncology Nurse Navigator  DIAGNOSIS:    ICD-10-CM   1. Malignant neoplasm of lower-inner quadrant of right breast of female, estrogen receptor positive ([place])  C50.311    Z17.0     SUMMARY OF ONCOLOGIC HISTORY: Oncology History  Malignant neoplasm of lower-inner quadrant of right breast of female, estrogen receptor positive ([place])  05/25/2019 Initial Diagnosis   Patient palpated a right breast lump for two years. Diagnostic mammogram showed a 4.5cm right breast mass at the 6 o'clock position involving the overlying skin, 2 benign appearing left breast mass at the 10 o'clock position measuring 2.4cm and 2.1cm, and no axillary adenopathy. Biopsy showed: in the left breast, fibroadenoma and no evidence of malignancy, and in the right breast, IDC with DCIS, grade 3.  ER 70%, PR 30%, Ki-67 40%, HER-2 negative by [facility]    06/10/2019 -  Chemotherapy   The patient had DOXOrubicin (ADRIAMYCIN) chemo injection 118 mg, 60 mg/m2 = 118 mg, Intravenous,  Once, 4 of 4 cycles Dose modification: 50 mg/m2 (original dose 60 mg/m2, Cycle 2, Reason: Dose not tolerated) Administration: 118 mg (06/10/2019), 98 mg (06/24/2019), 98 mg (07/08/2019), 98 mg (07/22/2019) palonosetron (ALOXI) injection 0.25 mg, 0.25 mg, Intravenous,  Once, 4 of 4 cycles Administration: 0.25 mg (06/10/2019), 0.25 mg (06/24/2019), 0.25 mg (07/08/2019), 0.25 mg (07/22/2019) pegfilgrastim-cbqv (UDENYCA) injection 6 mg, 6 mg, Subcutaneous, Once, 4 of 4 cycles Administration: 6 mg (06/12/2019), 6 mg (06/26/2019), 6 mg (07/10/2019), 6 mg (07/24/2019) cyclophosphamide (CYTOXAN) 1,180 mg in sodium chloride 0.9 % 250  mL chemo infusion, 600 mg/m2 = 1,180 mg, Intravenous,  Once, 4 of 4 cycles Dose modification: 500 mg/m2 (original dose 600 mg/m2, Cycle 2, Reason: Dose not tolerated) Administration: 1,180 mg (06/10/2019), 980 mg (06/24/2019), 980 mg (07/08/2019), 980 mg (07/22/2019) PACLitaxel (TAXOL) 156 mg in sodium chloride 0.9 % 250 mL chemo infusion (</= 40m/m2), 80 mg/m2 = 156 mg, Intravenous,  Once, 6 of 12 cycles Administration: 156 mg (08/12/2019), 156 mg (08/19/2019), 138 mg (08/26/2019), 138 mg (09/02/2019), 138 mg (09/09/2019), 138 mg (09/16/2019) fosaprepitant (EMEND) 150 mg, dexamethasone (DECADRON) 12 mg in sodium chloride 0.9 % 145 mL IVPB, , Intravenous,  Once, 4 of 4 cycles Administration:  (06/10/2019),  (06/24/2019),  (07/08/2019),  (07/22/2019)  for chemotherapy treatment.     Genetic Testing   No pathogenic variants identified. VUS in [place] c.2378A>G identified on the [facility] Common Hereditary Cancers Panel. The report date is 06/15/2019.  The Common Hereditary Cancers Panel offered by [facility] includes sequencing and/or deletion duplication testing of the following 48 genes: APC, ATM, AXIN2, BARD1, BMPR1A, BRCA1, BRCA2, BRIP1, CDH1, CDKN2A (p14ARF), CDKN2A (p16INK4a), CKD4, CHEK2, CTNNA1, DICER1, EPCAM (Deletion/duplication testing only), GREM1 (promoter region deletion/duplication testing only), KIT, MEN1, MLH1, MSH2, MSH3, MSH6, MUTYH, NBN, NF1, NHTL1, PALB2, PDGFRA, PMS2, POLD1, POLE, PTEN, RAD50, RAD51C, RAD51D, RNF43, SDHB, SDHC, SDHD, SMAD4, SMARCA4. STK11, TP53, TSC1, TSC2, and VHL.  The following genes were evaluated for sequence changes only: SDHA and HOXB13 c.251G>A variant only.     CHIEF COMPLIANT: Cycle 7 Taxol  INTERVAL HISTORY: Kimberly Murpheyis a 34y.o. with above-mentioned history of right breast cancer currently on neoadjuvant chemotherapy with weekly Taxol after she completed 4 cycle of  Adriamycin and Cytoxan.She presents to the clinic todayfor cycle7. She denies any  signs or symptoms of peripheral neuropathy.  She does have constipation and fatigue.  ALLERGIES:  has No Known Allergies.  MEDICATIONS:  Current Outpatient Medications  Medication Sig Dispense Refill  . docusate sodium (COLACE) 250 MG capsule Take 250 mg by mouth 2 (two) times daily.    Marland Kitchen etonogestrel (NEXPLANON) 68 MG IMPL implant 1 each by Subdermal route once.    . gabapentin (NEURONTIN) 100 MG capsule Take 1 capsule (100 mg total) by mouth at bedtime. 30 capsule 3  . Lactobacillus (PROBIOTIC ACIDOPHILUS) CAPS Take 1 capsule by mouth daily. 30 capsule 0  . lidocaine-prilocaine (EMLA) cream Apply to affected area once 30 g 3  . prochlorperazine (COMPAZINE) 10 MG tablet Take 1 tablet (10 mg total) by mouth every 6 (six) hours as needed (Nausea or vomiting). 30 tablet 1  . Sennosides (LAXATIVE) 25 MG TABS Take 1 tablet by mouth 2 (two) times daily as needed (constipation).    . simethicone (MYLICON) 80 MG chewable tablet Chew 80 mg by mouth every 6 (six) hours as needed for flatulence.     No current facility-administered medications for this visit.    PHYSICAL EXAMINATION: ECOG PERFORMANCE STATUS: 1 - Symptomatic but completely ambulatory  Vitals:   09/23/19 0821  BP: 103/67  Pulse: 100  Resp: 18  Temp: 98.5 F (36.9 C)  SpO2: 100%    LABORATORY DATA:  I have reviewed the data as listed CMP Latest Ref Rng & Units 09/16/2019 09/09/2019 09/02/2019  Glucose 70 - 99 mg/dL 82 92 88  BUN 6 - 20 mg/dL 5(L) 8 7  Creatinine 0.44 - 1.00 mg/dL 0.58 0.65 0.61  Sodium 135 - 145 mmol/L 143 140 141  Potassium 3.5 - 5.1 mmol/L 3.6 3.7 3.6  Chloride 98 - 111 mmol/L 109 108 108  CO2 22 - 32 mmol/L '25 23 23  ' Calcium 8.9 - 10.3 mg/dL 9.1 9.3 9.2  Total Protein 6.5 - 8.1 g/dL 6.4(L) 6.7 6.6  Total Bilirubin 0.3 - 1.2 mg/dL 0.7 0.8 0.9  Alkaline Phos 38 - 126 U/L 62 68 65  AST 15 - 41 U/L 47(H) 46(H) 60(H)  ALT 0 - 44 U/L 49(H) 56(H) 80(H)    Lab Results  Component Value Date   WBC 2.0 (L)  09/23/2019   HGB 10.3 (L) 09/23/2019   HCT 30.9 (L) 09/23/2019   MCV 96.6 09/23/2019   PLT 208 09/23/2019   NEUTROABS 1.3 (L) 09/23/2019    ASSESSMENT & PLAN:  Malignant neoplasm of lower-inner quadrant of right breast of female, estrogen receptor positive ([place]) 05/15/2019:Patient palpated a right breast lump for two years. Diagnostic mammogram showed a 4.5cm right breast mass at the 6 o'clock position involving the overlying skin, 2 benign appearing left breast mass at the 10 o'clock position measuring 2.4cm and 2.1cm, and no axillary adenopathy. 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%, HER-2 negative ratio 1.41 T2 N0 stage IIa clinical stage  Treatment plan: 1.Neoadjuvant chemotherapywith dose dense Adriamycin and Cytoxan x4 followed by Taxol x12starting 06/10/2019 2.Breast conserving surgery with sentinel lymph node biopsy 3.Adjuvant radiation 4.Follow-up adjuvant antiestrogen therapy(patient may be opting for oophorectomy plus anastrozole) ----------------------------------------------------------------------------------------------------------------------------------------------- Current treatment:Completed 4 cycles ofdose dense Adriamycin and Cytoxan, today cycle 7 Taxol Echocardiogram: 06/05/2019: EF 55 to 60% normal LV function  Chemo toxicities: 1.  Fatigue:  Continues to be mild to moderate. 2.  Elevation of LFTs: Monitoring the liver function tests.  They seem to be getting better with time. 3.  Hypokalemia on potassium supplement 4.  Leukopenia: I reduce the dosage of her chemotherapy today.  Her ANC is 1.3.  Okay to treat.  Liver biopsy: Benign 07/02/2019: MRI left femur: Benign  Return to clinic weekly for Taxol and every other week follow-up with me.    No orders of the defined types were placed in this encounter.  The patient has a good  understanding of the overall plan. she agrees with it. she will call with any problems that may develop before the next visit here.  Total time spent: 30 mins including face to face time and time spent for planning, charting and coordination of care  Nicholas Lose, MD 09/23/2019  I, Cloyde Reams Dorshimer, am acting as scribe for Dr. Nicholas Lose.  I have reviewed the above documentation for accuracy and completeness, and I agree with the above.

## 2019-09-23 ENCOUNTER — Inpatient Hospital Stay: Payer: No Typology Code available for payment source

## 2019-09-23 ENCOUNTER — Inpatient Hospital Stay (HOSPITAL_BASED_OUTPATIENT_CLINIC_OR_DEPARTMENT_OTHER): Payer: No Typology Code available for payment source | Admitting: Hematology and Oncology

## 2019-09-23 ENCOUNTER — Other Ambulatory Visit: Payer: Self-pay

## 2019-09-23 DIAGNOSIS — C50311 Malignant neoplasm of lower-inner quadrant of right female breast: Secondary | ICD-10-CM

## 2019-09-23 DIAGNOSIS — Z95828 Presence of other vascular implants and grafts: Secondary | ICD-10-CM

## 2019-09-23 DIAGNOSIS — Z170 Estrogen receptor positive status [ER+]: Secondary | ICD-10-CM | POA: Diagnosis not present

## 2019-09-23 DIAGNOSIS — Z5111 Encounter for antineoplastic chemotherapy: Secondary | ICD-10-CM | POA: Diagnosis not present

## 2019-09-23 LAB — CBC WITH DIFFERENTIAL (CANCER CENTER ONLY)
Abs Immature Granulocytes: 0.01 10*3/uL (ref 0.00–0.07)
Basophils Absolute: 0 10*3/uL (ref 0.0–0.1)
Basophils Relative: 1 %
Eosinophils Absolute: 0.1 10*3/uL (ref 0.0–0.5)
Eosinophils Relative: 4 %
HCT: 30.9 % — ABNORMAL LOW (ref 36.0–46.0)
Hemoglobin: 10.3 g/dL — ABNORMAL LOW (ref 12.0–15.0)
Immature Granulocytes: 1 %
Lymphocytes Relative: 21 %
Lymphs Abs: 0.4 10*3/uL — ABNORMAL LOW (ref 0.7–4.0)
MCH: 32.2 pg (ref 26.0–34.0)
MCHC: 33.3 g/dL (ref 30.0–36.0)
MCV: 96.6 fL (ref 80.0–100.0)
Monocytes Absolute: 0.2 10*3/uL (ref 0.1–1.0)
Monocytes Relative: 10 %
Neutro Abs: 1.3 10*3/uL — ABNORMAL LOW (ref 1.7–7.7)
Neutrophils Relative %: 63 %
Platelet Count: 208 10*3/uL (ref 150–400)
RBC: 3.2 MIL/uL — ABNORMAL LOW (ref 3.87–5.11)
RDW: 13.7 % (ref 11.5–15.5)
WBC Count: 2 10*3/uL — ABNORMAL LOW (ref 4.0–10.5)
nRBC: 0 % (ref 0.0–0.2)

## 2019-09-23 LAB — CMP (CANCER CENTER ONLY)
ALT: 69 U/L — ABNORMAL HIGH (ref 0–44)
AST: 61 U/L — ABNORMAL HIGH (ref 15–41)
Albumin: 3.7 g/dL (ref 3.5–5.0)
Alkaline Phosphatase: 64 U/L (ref 38–126)
Anion gap: 10 (ref 5–15)
BUN: <4 mg/dL — ABNORMAL LOW (ref 6–20)
CO2: 22 mmol/L (ref 22–32)
Calcium: 9.2 mg/dL (ref 8.9–10.3)
Chloride: 110 mmol/L (ref 98–111)
Creatinine: 0.57 mg/dL (ref 0.44–1.00)
GFR, Est AFR Am: >60 mL/min (ref >60)
GFR, Estimated: >60 mL/min (ref >60)
Glucose, Bld: 85 mg/dL (ref 70–99)
Potassium: 3.4 mmol/L — ABNORMAL LOW (ref 3.5–5.1)
Sodium: 142 mmol/L (ref 135–145)
Total Bilirubin: 0.7 mg/dL (ref 0.3–1.2)
Total Protein: 6.1 g/dL — ABNORMAL LOW (ref 6.5–8.1)

## 2019-09-23 MED ORDER — SODIUM CHLORIDE 0.9 % IV SOLN
Freq: Once | INTRAVENOUS | Status: AC
  Filled 2019-09-23: qty 250

## 2019-09-23 MED ORDER — DEXAMETHASONE SODIUM PHOSPHATE 10 MG/ML IJ SOLN
4.0000 mg | Freq: Once | INTRAMUSCULAR | Status: AC
  Administered 2019-09-23: 4 mg via INTRAVENOUS

## 2019-09-23 MED ORDER — SODIUM CHLORIDE 0.9 % IV SOLN
65.0000 mg/m2 | Freq: Once | INTRAVENOUS | Status: AC
  Administered 2019-09-23: 114 mg via INTRAVENOUS
  Filled 2019-09-23: qty 19

## 2019-09-23 MED ORDER — DIPHENHYDRAMINE HCL 50 MG/ML IJ SOLN
INTRAMUSCULAR | Status: AC
  Filled 2019-09-23: qty 1

## 2019-09-23 MED ORDER — FAMOTIDINE IN NACL 20-0.9 MG/50ML-% IV SOLN
20.0000 mg | Freq: Once | INTRAVENOUS | Status: AC
  Administered 2019-09-23: 20 mg via INTRAVENOUS

## 2019-09-23 MED ORDER — SODIUM CHLORIDE 0.9% FLUSH
10.0000 mL | INTRAVENOUS | Status: DC | PRN
  Administered 2019-09-23: 10 mL
  Filled 2019-09-23: qty 10

## 2019-09-23 MED ORDER — DIPHENHYDRAMINE HCL 50 MG/ML IJ SOLN
25.0000 mg | Freq: Once | INTRAMUSCULAR | Status: AC
  Administered 2019-09-23: 25 mg via INTRAVENOUS

## 2019-09-23 MED ORDER — HEPARIN SOD (PORK) LOCK FLUSH 100 UNIT/ML IV SOLN
500.0000 [IU] | Freq: Once | INTRAVENOUS | Status: AC | PRN
  Administered 2019-09-23: 500 [IU]
  Filled 2019-09-23: qty 5

## 2019-09-23 MED ORDER — FAMOTIDINE IN NACL 20-0.9 MG/50ML-% IV SOLN
INTRAVENOUS | Status: AC
  Filled 2019-09-23: qty 50

## 2019-09-23 MED ORDER — DEXAMETHASONE SODIUM PHOSPHATE 10 MG/ML IJ SOLN
INTRAMUSCULAR | Status: AC
  Filled 2019-09-23: qty 1

## 2019-09-23 NOTE — Progress Notes (Signed)
Per Dr. Lindi Adie, patient is OK to treat without pregnancy test today

## 2019-09-23 NOTE — Assessment & Plan Note (Signed)
05/15/2019:Patient palpated a right breast lump for two years. Diagnostic mammogram showed a 4.5cm right breast mass at the 6 o'clock position involving the overlying skin, 2 benign appearing left breast mass at the 10 o'clock position measuring 2.4cm and 2.1cm, and no axillary adenopathy. 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%, HER-2 negative ratio 1.41 T2 N0 stage IIa clinical stage  Treatment plan: 1.Neoadjuvant chemotherapywith dose dense Adriamycin and Cytoxan x4 followed by Taxol x12starting 06/10/2019 2.Breast conserving surgery with sentinel lymph node biopsy 3.Adjuvant radiation 4.Follow-up adjuvant antiestrogen therapy(patient may be opting for oophorectomy plus anastrozole) ----------------------------------------------------------------------------------------------------------------------------------------------- Current treatment:Completed 4 cycles ofdose dense Adriamycin and Cytoxan, today cycle 7 Taxol Echocardiogram: 06/05/2019: EF 55 to 60% normal LV function  Chemo toxicities: 1.  Fatigue 2.  Elevation of LFTs: Monitoring the liver function tests. 3.  Hypokalemia on potassium supplement  Liver biopsy: Benign 07/02/2019: MRI left femur: Benign  Return to clinic weekly for Taxol and every other week follow-up with me.

## 2019-09-23 NOTE — Patient Instructions (Signed)
[facility] Discharge Instructions for Patients Receiving Chemotherapy  Today you received the following chemotherapy agents: paclitaxel.  To help prevent nausea and vomiting after your treatment, we encourage you to take your nausea medication as directed.   If you develop nausea and vomiting that is not controlled by your nausea medication, call the clinic.   BELOW ARE SYMPTOMS THAT SHOULD BE REPORTED IMMEDIATELY:  *FEVER GREATER THAN 100.5 F  *CHILLS WITH OR WITHOUT FEVER  NAUSEA AND VOMITING THAT IS NOT CONTROLLED WITH YOUR NAUSEA MEDICATION  *UNUSUAL SHORTNESS OF BREATH  *UNUSUAL BRUISING OR BLEEDING  TENDERNESS IN MOUTH AND THROAT WITH OR WITHOUT PRESENCE OF ULCERS  *URINARY PROBLEMS  *BOWEL PROBLEMS  UNUSUAL RASH Items with * indicate a potential emergency and should be followed up as soon as possible.  Feel free to call the clinic should you have any questions or concerns. The clinic phone number is [phone].  Please show the CHEMO ALERT CARD at check-in to the Emergency Department and triage nurse.   

## 2019-09-23 NOTE — Progress Notes (Signed)
Per MD okay to treat with ANC 1.3. ?

## 2019-09-30 ENCOUNTER — Other Ambulatory Visit: Payer: Self-pay

## 2019-09-30 ENCOUNTER — Inpatient Hospital Stay: Payer: No Typology Code available for payment source

## 2019-09-30 ENCOUNTER — Other Ambulatory Visit: Payer: Self-pay | Admitting: Medical

## 2019-09-30 ENCOUNTER — Other Ambulatory Visit: Payer: Self-pay | Admitting: Hematology and Oncology

## 2019-09-30 ENCOUNTER — Inpatient Hospital Stay (HOSPITAL_BASED_OUTPATIENT_CLINIC_OR_DEPARTMENT_OTHER): Payer: No Typology Code available for payment source | Admitting: Medical

## 2019-09-30 VITALS — BP 101/72 | HR 72 | Temp 99.1°F | Resp 18

## 2019-09-30 DIAGNOSIS — C50311 Malignant neoplasm of lower-inner quadrant of right female breast: Secondary | ICD-10-CM

## 2019-09-30 DIAGNOSIS — Z170 Estrogen receptor positive status [ER+]: Secondary | ICD-10-CM

## 2019-09-30 DIAGNOSIS — Z5111 Encounter for antineoplastic chemotherapy: Secondary | ICD-10-CM | POA: Diagnosis not present

## 2019-09-30 DIAGNOSIS — R1013 Epigastric pain: Secondary | ICD-10-CM

## 2019-09-30 LAB — CMP (CANCER CENTER ONLY)
ALT: 58 U/L — ABNORMAL HIGH (ref 0–44)
AST: 48 U/L — ABNORMAL HIGH (ref 15–41)
Albumin: 3.8 g/dL (ref 3.5–5.0)
Alkaline Phosphatase: 68 U/L (ref 38–126)
Anion gap: 9 (ref 5–15)
BUN: 5 mg/dL — ABNORMAL LOW (ref 6–20)
CO2: 23 mmol/L (ref 22–32)
Calcium: 9.3 mg/dL (ref 8.9–10.3)
Chloride: 110 mmol/L (ref 98–111)
Creatinine: 0.58 mg/dL (ref 0.44–1.00)
GFR, Est AFR Am: >60 mL/min (ref >60)
GFR, Estimated: >60 mL/min (ref >60)
Glucose, Bld: 86 mg/dL (ref 70–99)
Potassium: 3.4 mmol/L — ABNORMAL LOW (ref 3.5–5.1)
Sodium: 142 mmol/L (ref 135–145)
Total Bilirubin: 0.7 mg/dL (ref 0.3–1.2)
Total Protein: 6.3 g/dL — ABNORMAL LOW (ref 6.5–8.1)

## 2019-09-30 LAB — CBC WITH DIFFERENTIAL (CANCER CENTER ONLY)
Abs Immature Granulocytes: 0 10*3/uL (ref 0.00–0.07)
Basophils Absolute: 0 10*3/uL (ref 0.0–0.1)
Basophils Relative: 1 %
Eosinophils Absolute: 0.1 10*3/uL (ref 0.0–0.5)
Eosinophils Relative: 3 %
HCT: 32.6 % — ABNORMAL LOW (ref 36.0–46.0)
Hemoglobin: 10.7 g/dL — ABNORMAL LOW (ref 12.0–15.0)
Immature Granulocytes: 0 %
Lymphocytes Relative: 27 %
Lymphs Abs: 0.5 10*3/uL — ABNORMAL LOW (ref 0.7–4.0)
MCH: 31.4 pg (ref 26.0–34.0)
MCHC: 32.8 g/dL (ref 30.0–36.0)
MCV: 95.6 fL (ref 80.0–100.0)
Monocytes Absolute: 0.2 10*3/uL (ref 0.1–1.0)
Monocytes Relative: 12 %
Neutro Abs: 1.1 10*3/uL — ABNORMAL LOW (ref 1.7–7.7)
Neutrophils Relative %: 57 %
Platelet Count: 226 10*3/uL (ref 150–400)
RBC: 3.41 MIL/uL — ABNORMAL LOW (ref 3.87–5.11)
RDW: 13.5 % (ref 11.5–15.5)
WBC Count: 1.9 10*3/uL — ABNORMAL LOW (ref 4.0–10.5)
nRBC: 0 % (ref 0.0–0.2)

## 2019-09-30 LAB — PREGNANCY, URINE: Preg Test, Ur: NEGATIVE

## 2019-09-30 MED ORDER — SODIUM CHLORIDE 0.9 % IV SOLN
Freq: Once | INTRAVENOUS | Status: AC
  Filled 2019-09-30: qty 250

## 2019-09-30 MED ORDER — SODIUM CHLORIDE 0.9 % IV SOLN
50.0000 mg/m2 | Freq: Once | INTRAVENOUS | Status: AC
  Administered 2019-09-30: 90 mg via INTRAVENOUS
  Filled 2019-09-30: qty 15

## 2019-09-30 MED ORDER — SODIUM CHLORIDE 0.9% FLUSH
10.0000 mL | INTRAVENOUS | Status: DC | PRN
  Administered 2019-09-30: 10 mL
  Filled 2019-09-30: qty 10

## 2019-09-30 MED ORDER — FAMOTIDINE IN NACL 20-0.9 MG/50ML-% IV SOLN
INTRAVENOUS | Status: AC
  Filled 2019-09-30: qty 50

## 2019-09-30 MED ORDER — DEXAMETHASONE SODIUM PHOSPHATE 10 MG/ML IJ SOLN
INTRAMUSCULAR | Status: AC
  Filled 2019-09-30: qty 1

## 2019-09-30 MED ORDER — FAMOTIDINE IN NACL 20-0.9 MG/50ML-% IV SOLN
20.0000 mg | Freq: Once | INTRAVENOUS | Status: AC
  Administered 2019-09-30: 20 mg via INTRAVENOUS

## 2019-09-30 MED ORDER — DIPHENHYDRAMINE HCL 50 MG/ML IJ SOLN
25.0000 mg | Freq: Once | INTRAMUSCULAR | Status: AC
  Administered 2019-09-30: 25 mg via INTRAVENOUS

## 2019-09-30 MED ORDER — HEPARIN SOD (PORK) LOCK FLUSH 100 UNIT/ML IV SOLN
500.0000 [IU] | Freq: Once | INTRAVENOUS | Status: AC | PRN
  Administered 2019-09-30: 500 [IU]
  Filled 2019-09-30: qty 5

## 2019-09-30 MED ORDER — DEXAMETHASONE SODIUM PHOSPHATE 10 MG/ML IJ SOLN
4.0000 mg | Freq: Once | INTRAMUSCULAR | Status: AC
  Administered 2019-09-30: 4 mg via INTRAVENOUS

## 2019-09-30 MED ORDER — OMEPRAZOLE 40 MG PO CPDR: 40.0000 mg | DELAYED_RELEASE_CAPSULE | Freq: Every day | ORAL | 3 refills | Status: DC

## 2019-09-30 MED ORDER — DIPHENHYDRAMINE HCL 50 MG/ML IJ SOLN
INTRAMUSCULAR | Status: AC
  Filled 2019-09-30: qty 1

## 2019-09-30 NOTE — Progress Notes (Signed)
Okay to treat with ANC 1.1 per Dr. Lindi Adie and he will dose reduce.

## 2019-09-30 NOTE — Progress Notes (Signed)
ANC 1.1: We will decrease the dosage of Taxol to 50 mg meter square

## 2019-09-30 NOTE — Patient Instructions (Signed)
[facility] Discharge Instructions for Patients Receiving Chemotherapy  Today you received the following chemotherapy agents Paclitaxel  To help prevent nausea and vomiting after your treatment, we encourage you to take your nausea medication as directed   If you develop nausea and vomiting that is not controlled by your nausea medication, call the clinic.   BELOW ARE SYMPTOMS THAT SHOULD BE REPORTED IMMEDIATELY:  *FEVER GREATER THAN 100.5 F  *CHILLS WITH OR WITHOUT FEVER  NAUSEA AND VOMITING THAT IS NOT CONTROLLED WITH YOUR NAUSEA MEDICATION  *UNUSUAL SHORTNESS OF BREATH  *UNUSUAL BRUISING OR BLEEDING  TENDERNESS IN MOUTH AND THROAT WITH OR WITHOUT PRESENCE OF ULCERS  *URINARY PROBLEMS  *BOWEL PROBLEMS  UNUSUAL RASH Items with * indicate a potential emergency and should be followed up as soon as possible.  Feel free to call the clinic should you have any questions or concerns. The clinic phone number is [phone].  Please show the CHEMO ALERT CARD at check-in to the Emergency Department and triage nurse.   

## 2019-10-01 NOTE — Progress Notes (Signed)
Pharmacist Chemotherapy Monitoring - Follow Up Assessment    I verify that I have reviewed each item in the below checklist:  . Regimen for the patient is scheduled for the appropriate day and plan matches scheduled date. Marland Kitchen Appropriate non-routine labs are ordered dependent on drug ordered. . If applicable, additional medications reviewed and ordered per protocol based on lifetime cumulative doses and/or treatment regimen.   Plan for follow-up and/or issues identified: Yes . I-vent associated with next due treatment: Yes . MD and/or nursing notified: No   Kennith Center, Pharm.D., CPP 10/01/2019@3 :00 PM

## 2019-10-02 ENCOUNTER — Encounter: Payer: Self-pay | Admitting: Medical

## 2019-10-02 ENCOUNTER — Telehealth: Payer: Self-pay | Admitting: *Deleted

## 2019-10-02 NOTE — Progress Notes (Signed)
[name] was seen in the infusion room after she indicated that she had been having epigastric pain. She has a h/o GERD and a hiatal hernia which has been surgically repaired. This pain is episodic and is not associated with any activity. It is described as dull and lasts "a couple of hours." She takes TUMS occasionally. She denies nausea or vomiting. An EKG was done which showed NSR at 63 BPM with a sinus arrhythmia. She will begin a trial of Prilosec 40 mg once daily.  Sandi Mealy, MHS, PA-C Physician Assistant

## 2019-10-02 NOTE — Telephone Encounter (Signed)
Pt called to notify of epigastric discomfort and gas pains as well as neuropathy. Encourage pt to continue prilosec as prescribed by Lucianne Lei PA as well as simethicone for gas pains. Discussed avoiding spicy foods and those that are difficult to digest. Received verbal understanding. Pt relate had neuropathy the night of last treatment but dissipated by following morning. No further neuropathy noted. Dr. Lindi Adie and Mendel Ryder, NP notified. Denies further needs or questions at this time.

## 2019-10-07 ENCOUNTER — Telehealth: Payer: Self-pay | Admitting: *Deleted

## 2019-10-07 ENCOUNTER — Inpatient Hospital Stay: Payer: No Typology Code available for payment source

## 2019-10-07 ENCOUNTER — Inpatient Hospital Stay (HOSPITAL_BASED_OUTPATIENT_CLINIC_OR_DEPARTMENT_OTHER): Payer: No Typology Code available for payment source | Admitting: Adult Health

## 2019-10-07 ENCOUNTER — Encounter: Payer: Self-pay | Admitting: Adult Health

## 2019-10-07 ENCOUNTER — Other Ambulatory Visit: Payer: Self-pay

## 2019-10-07 VITALS — BP 125/52 | HR 64 | Temp 98.5°F | Resp 18 | Ht 65.0 in | Wt 136.1 lb

## 2019-10-07 DIAGNOSIS — C50311 Malignant neoplasm of lower-inner quadrant of right female breast: Secondary | ICD-10-CM

## 2019-10-07 DIAGNOSIS — Z170 Estrogen receptor positive status [ER+]: Secondary | ICD-10-CM | POA: Diagnosis not present

## 2019-10-07 DIAGNOSIS — Z5111 Encounter for antineoplastic chemotherapy: Secondary | ICD-10-CM | POA: Diagnosis not present

## 2019-10-07 DIAGNOSIS — Z95828 Presence of other vascular implants and grafts: Secondary | ICD-10-CM

## 2019-10-07 LAB — CMP (CANCER CENTER ONLY)
ALT: 45 U/L — ABNORMAL HIGH (ref 0–44)
AST: 42 U/L — ABNORMAL HIGH (ref 15–41)
Albumin: 3.8 g/dL (ref 3.5–5.0)
Alkaline Phosphatase: 64 U/L (ref 38–126)
Anion gap: 10 (ref 5–15)
BUN: 6 mg/dL (ref 6–20)
CO2: 21 mmol/L — ABNORMAL LOW (ref 22–32)
Calcium: 9.3 mg/dL (ref 8.9–10.3)
Chloride: 109 mmol/L (ref 98–111)
Creatinine: 0.62 mg/dL (ref 0.44–1.00)
GFR, Est AFR Am: >60 mL/min (ref >60)
GFR, Estimated: >60 mL/min (ref >60)
Glucose, Bld: 87 mg/dL (ref 70–99)
Potassium: 3.7 mmol/L (ref 3.5–5.1)
Sodium: 140 mmol/L (ref 135–145)
Total Bilirubin: 0.6 mg/dL (ref 0.3–1.2)
Total Protein: 6.5 g/dL (ref 6.5–8.1)

## 2019-10-07 LAB — CBC WITH DIFFERENTIAL (CANCER CENTER ONLY)
Abs Immature Granulocytes: 0 10*3/uL (ref 0.00–0.07)
Basophils Absolute: 0 10*3/uL (ref 0.0–0.1)
Basophils Relative: 0 %
Eosinophils Absolute: 0.1 10*3/uL (ref 0.0–0.5)
Eosinophils Relative: 3 %
HCT: 33.1 % — ABNORMAL LOW (ref 36.0–46.0)
Hemoglobin: 11 g/dL — ABNORMAL LOW (ref 12.0–15.0)
Immature Granulocytes: 0 %
Lymphocytes Relative: 23 %
Lymphs Abs: 0.5 10*3/uL — ABNORMAL LOW (ref 0.7–4.0)
MCH: 31.8 pg (ref 26.0–34.0)
MCHC: 33.2 g/dL (ref 30.0–36.0)
MCV: 95.7 fL (ref 80.0–100.0)
Monocytes Absolute: 0.3 10*3/uL (ref 0.1–1.0)
Monocytes Relative: 13 %
Neutro Abs: 1.4 10*3/uL — ABNORMAL LOW (ref 1.7–7.7)
Neutrophils Relative %: 61 %
Platelet Count: 215 10*3/uL (ref 150–400)
RBC: 3.46 MIL/uL — ABNORMAL LOW (ref 3.87–5.11)
RDW: 13.3 % (ref 11.5–15.5)
WBC Count: 2.4 10*3/uL — ABNORMAL LOW (ref 4.0–10.5)
nRBC: 0 % (ref 0.0–0.2)

## 2019-10-07 MED ORDER — DIPHENHYDRAMINE HCL 50 MG/ML IJ SOLN
25.0000 mg | Freq: Once | INTRAMUSCULAR | Status: AC
  Administered 2019-10-07: 25 mg via INTRAVENOUS

## 2019-10-07 MED ORDER — SODIUM CHLORIDE 0.9% FLUSH
10.0000 mL | INTRAVENOUS | Status: DC | PRN
  Administered 2019-10-07: 10 mL
  Filled 2019-10-07: qty 10

## 2019-10-07 MED ORDER — HEPARIN SOD (PORK) LOCK FLUSH 100 UNIT/ML IV SOLN
500.0000 [IU] | Freq: Once | INTRAVENOUS | Status: AC | PRN
  Administered 2019-10-07: 500 [IU]
  Filled 2019-10-07: qty 5

## 2019-10-07 MED ORDER — FAMOTIDINE IN NACL 20-0.9 MG/50ML-% IV SOLN
20.0000 mg | Freq: Once | INTRAVENOUS | Status: AC
  Administered 2019-10-07: 20 mg via INTRAVENOUS

## 2019-10-07 MED ORDER — DEXAMETHASONE SODIUM PHOSPHATE 10 MG/ML IJ SOLN
INTRAMUSCULAR | Status: AC
  Filled 2019-10-07: qty 1

## 2019-10-07 MED ORDER — DEXAMETHASONE SODIUM PHOSPHATE 10 MG/ML IJ SOLN
4.0000 mg | Freq: Once | INTRAMUSCULAR | Status: AC
  Administered 2019-10-07: 4 mg via INTRAVENOUS

## 2019-10-07 MED ORDER — FAMOTIDINE IN NACL 20-0.9 MG/50ML-% IV SOLN
INTRAVENOUS | Status: AC
  Filled 2019-10-07: qty 50

## 2019-10-07 MED ORDER — DIPHENHYDRAMINE HCL 50 MG/ML IJ SOLN
INTRAMUSCULAR | Status: AC
  Filled 2019-10-07: qty 1

## 2019-10-07 MED ORDER — SODIUM CHLORIDE 0.9 % IV SOLN
50.0000 mg/m2 | Freq: Once | INTRAVENOUS | Status: AC
  Administered 2019-10-07: 90 mg via INTRAVENOUS
  Filled 2019-10-07: qty 15

## 2019-10-07 MED ORDER — SODIUM CHLORIDE 0.9 % IV SOLN
Freq: Once | INTRAVENOUS | Status: AC
  Filled 2019-10-07: qty 250

## 2019-10-07 NOTE — Progress Notes (Signed)
[facility] Follow up:    Kimberly Panda, MD [address] [place] [address]   DIAGNOSIS: Cancer Staging Malignant neoplasm of lower-inner quadrant of right breast of female, estrogen receptor positive ([place]) Staging form: Breast, AJCC 8th Edition - Clinical stage from 05/27/2019: Stage IIA (cT2, cN0, cM0, G3, ER+, PR+, HER2: Equivocal) - Unsigned   SUMMARY OF ONCOLOGIC HISTORY: Oncology History  Malignant neoplasm of lower-inner quadrant of right breast of female, estrogen receptor positive ([place])  05/25/2019 Initial Diagnosis   Patient palpated a right breast lump for two years. Diagnostic mammogram showed a 4.5cm right breast mass at the 6 o'clock position involving the overlying skin, 2 benign appearing left breast mass at the 10 o'clock position measuring 2.4cm and 2.1cm, and no axillary adenopathy. Biopsy showed: in the left breast, fibroadenoma and no evidence of malignancy, and in the right breast, IDC with DCIS, grade 3.  ER 70%, PR 30%, Ki-67 40%, HER-2 negative by [facility]    06/10/2019 -  Chemotherapy   The patient had DOXOrubicin (ADRIAMYCIN) chemo injection 118 mg, 60 mg/m2 = 118 mg, Intravenous,  Once, 4 of 4 cycles Dose modification: 50 mg/m2 (original dose 60 mg/m2, Cycle 2, Reason: Dose not tolerated) Administration: 118 mg (06/10/2019), 98 mg (06/24/2019), 98 mg (07/08/2019), 98 mg (07/22/2019) palonosetron (ALOXI) injection 0.25 mg, 0.25 mg, Intravenous,  Once, 4 of 4 cycles Administration: 0.25 mg (06/10/2019), 0.25 mg (06/24/2019), 0.25 mg (07/08/2019), 0.25 mg (07/22/2019) pegfilgrastim-cbqv (UDENYCA) injection 6 mg, 6 mg, Subcutaneous, Once, 4 of 4 cycles Administration: 6 mg (06/12/2019), 6 mg (06/26/2019), 6 mg (07/10/2019), 6 mg (07/24/2019) cyclophosphamide (CYTOXAN) 1,180 mg in sodium chloride 0.9 % 250 mL chemo infusion, 600 mg/m2 = 1,180 mg, Intravenous,  Once, 4 of 4 cycles Dose modification: 500 mg/m2 (original dose 600 mg/m2, Cycle 2, Reason:  Dose not tolerated) Administration: 1,180 mg (06/10/2019), 980 mg (06/24/2019), 980 mg (07/08/2019), 980 mg (07/22/2019) PACLitaxel (TAXOL) 156 mg in sodium chloride 0.9 % 250 mL chemo infusion (</= 61m/m2), 80 mg/m2 = 156 mg, Intravenous,  Once, 8 of 12 cycles Dose modification: 65 mg/m2 (original dose 80 mg/m2, Cycle 11, Reason: Provider Judgment), 50 mg/m2 (original dose 80 mg/m2, Cycle 12, Reason: Dose not tolerated) Administration: 156 mg (08/12/2019), 156 mg (08/19/2019), 138 mg (08/26/2019), 138 mg (09/02/2019), 138 mg (09/09/2019), 138 mg (09/16/2019), 114 mg (09/23/2019), 90 mg (09/30/2019) fosaprepitant (EMEND) 150 mg, dexamethasone (DECADRON) 12 mg in sodium chloride 0.9 % 145 mL IVPB, , Intravenous,  Once, 4 of 4 cycles Administration:  (06/10/2019),  (06/24/2019),  (07/08/2019),  (07/22/2019)  for chemotherapy treatment.     Genetic Testing   No pathogenic variants identified. VUS in DDuncancalled c.2378A>G identified on the [facility] Common Hereditary Cancers Panel. The report date is 06/15/2019.  The Common Hereditary Cancers Panel offered by [facility] includes sequencing and/or deletion duplication testing of the following 48 genes: APC, ATM, AXIN2, BARD1, BMPR1A, BRCA1, BRCA2, BRIP1, CDH1, CDKN2A (p14ARF), CDKN2A (p16INK4a), CKD4, CHEK2, CTNNA1, DICER1, EPCAM (Deletion/duplication testing only), GREM1 (promoter region deletion/duplication testing only), KIT, MEN1, MLH1, MSH2, MSH3, MSH6, MUTYH, NBN, NF1, NHTL1, PALB2, PDGFRA, PMS2, POLD1, POLE, PTEN, RAD50, RAD51C, RAD51D, RNF43, SDHB, SDHC, SDHD, SMAD4, SMARCA4. STK11, TP53, TSC1, TSC2, and VHL.  The following genes were evaluated for sequence changes only: SDHA and HOXB13 c.251G>A variant only.     CURRENT THERAPY: Weekly Taxol  INTERVAL HISTORY: [name]89y.o. female returns for evaluation prior to receiving her weekly neoadjuvant Taxol.  She continues to do  well.  She is receiving reduced dose.  Her ANC is 1.4 today, and she has no  increase in neuropathy.  She does have some numbness on her left lateral hand into her 5th digit.  No motor changes, no other neuropathy in her fingertips or toes.  She notes her breast mass is much smaller than prior.    Kimberly Mueller is having an increase in blurry vision and wants to know if she should go ahead and get her eyes checked.  Her vision insurance starts in June.     Patient Active Problem List   Diagnosis Date Noted  . Port-A-Cath in place 07/22/2019  . Genetic testing 06/15/2019  . Family history of prostate cancer   . Family history of pancreatic cancer   . Malignant neoplasm of lower-inner quadrant of right breast of female, estrogen receptor positive ([place]) 05/25/2019    has No Known Allergies.  MEDICAL HISTORY: Past Medical History:  Diagnosis Date  . Breast cancer ([place])   . Cancer [facility])    breast  . Family history of pancreatic cancer   . Family history of prostate cancer     SURGICAL HISTORY: Past Surgical History:  Procedure Laterality Date  . GASTRIC BYPASS    . HERNIA REPAIR    . PORTACATH PLACEMENT Right 06/09/2019   Procedure: INSERTION PORT-A-CATH WITH ULTRASOUND;  Surgeon: Rolm Bookbinder, MD;  Location: [facility];  Service: General;  Laterality: Right;    SOCIAL HISTORY: Social History   Socioeconomic History  . Marital status: Married    Spouse name: Not on file  . Number of children: Not on file  . Years of education: Not on file  . Highest education level: Not on file  Occupational History  . Not on file  Tobacco Use  . Smoking status: Former Research scientist (life sciences)  . Smokeless tobacco: Never Used  Substance and Sexual Activity  . Alcohol use: Never  . Drug use: Never  . Sexual activity: Not on file  Other Topics Concern  . Not on file  Social History Narrative  . Not on file   Social Determinants of Health   Financial Resource Strain:   . Difficulty of Paying Living Expenses:   Food Insecurity:   . Worried About Ship broker in the Last Year:   . Arboriculturist in the Last Year:   Transportation Needs:   . Film/video editor (Medical):   Marland Kitchen Lack of Transportation (Non-Medical):   Physical Activity:   . Days of Exercise per Week:   . Minutes of Exercise per Session:   Stress:   . Feeling of Stress :   Social Connections:   . Frequency of Communication with Friends and Family:   . Frequency of Social Gatherings with Friends and Family:   . Attends Religious Services:   . Active Member of Clubs or Organizations:   . Attends Archivist Meetings:   Marland Kitchen Marital Status:   Intimate Partner Violence:   . Fear of Current or Ex-Partner:   . Emotionally Abused:   Marland Kitchen Physically Abused:   . Sexually Abused:     FAMILY HISTORY: Family History  Problem Relation Age of Onset  . Prostate cancer Father   . Pancreatic cancer Paternal Grandmother     Review of Systems  Constitutional: Positive for fatigue. Negative for appetite change, chills, fever and unexpected weight change.  HENT:   Negative for hearing loss, lump/mass and mouth sores.   Eyes: Negative  for eye problems and icterus.  Respiratory: Negative for chest tightness, cough and shortness of breath.   Cardiovascular: Negative for chest pain, leg swelling and palpitations.  Gastrointestinal: Negative for abdominal distention, abdominal pain, blood in stool, constipation, diarrhea and nausea.  Endocrine: Negative for hot flashes.  Genitourinary: Negative for difficulty urinating.   Musculoskeletal: Negative for arthralgias.  Skin: Negative for itching and rash.  Neurological: Negative for dizziness, extremity weakness, headaches and numbness.  Hematological: Negative for adenopathy. Does not bruise/bleed easily.  Psychiatric/Behavioral: Negative for depression. The patient is not nervous/anxious.       PHYSICAL EXAMINATION  ECOG PERFORMANCE STATUS: 1 - Symptomatic but completely ambulatory  Vitals:   10/07/19 0920  BP: (!)  125/52  Pulse: 64  Resp: 18  Temp: 98.5 F (36.9 C)  SpO2: 97%    Physical Exam Constitutional:      General: She is not in acute distress.    Appearance: Normal appearance. She is not toxic-appearing.  HENT:     Head: Normocephalic and atraumatic.  Eyes:     General: No scleral icterus. Cardiovascular:     Rate and Rhythm: Normal rate and regular rhythm.     Pulses: Normal pulses.     Heart sounds: Normal heart sounds.  Pulmonary:     Effort: Pulmonary effort is normal.     Breath sounds: Normal breath sounds.  Abdominal:     General: Abdomen is flat.     Palpations: Abdomen is soft.  Musculoskeletal:        General: No swelling.     Cervical back: Neck supple.  Skin:    General: Skin is warm and dry.     Capillary Refill: Capillary refill takes less than 2 seconds.     Findings: No rash.  Neurological:     General: No focal deficit present.     Mental Status: She is alert and oriented to person, place, and time.  Psychiatric:        Mood and Affect: Mood normal.        Behavior: Behavior normal.     LABORATORY DATA:  CBC    Component Value Date/Time   WBC 2.4 (L) 10/07/2019 0838   WBC 3.3 (L) 06/22/2019 1135   RBC 3.46 (L) 10/07/2019 0838   HGB 11.0 (L) 10/07/2019 0838   HCT 33.1 (L) 10/07/2019 0838   PLT 215 10/07/2019 0838   MCV 95.7 10/07/2019 0838   MCH 31.8 10/07/2019 0838   MCHC 33.2 10/07/2019 0838   RDW 13.3 10/07/2019 0838   LYMPHSABS 0.5 (L) 10/07/2019 0838   MONOABS 0.3 10/07/2019 0838   EOSABS 0.1 10/07/2019 0838   BASOSABS 0.0 10/07/2019 0838    CMP     Component Value Date/Time   NA 140 10/07/2019 0838   K 3.7 10/07/2019 0838   CL 109 10/07/2019 0838   CO2 21 (L) 10/07/2019 0838   GLUCOSE 87 10/07/2019 0838   BUN 6 10/07/2019 0838   CREATININE 0.62 10/07/2019 0838   CALCIUM 9.3 10/07/2019 0838   PROT 6.5 10/07/2019 0838   ALBUMIN 3.8 10/07/2019 0838   AST 42 (H) 10/07/2019 0838   ALT 45 (H) 10/07/2019 0838   ALKPHOS 64  10/07/2019 0838   BILITOT 0.6 10/07/2019 0838   GFRNONAA >60 10/07/2019 0838   GFRAA >60 10/07/2019 0838            ASSESSMENT and THERAPY PLAN:   Malignant neoplasm of lower-inner quadrant of right breast of female, estrogen  receptor positive ([place]) 05/15/2019:Patient palpated a right breast lump for two years. Diagnostic mammogram showed a 4.5cm right breast mass at the 6 o'clock position involving the overlying skin, 2 benign appearing left breast mass at the 10 o'clock position measuring 2.4cm and 2.1cm, and no axillary adenopathy. 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%, HER-2 negative ratio 1.41 T2 N0 stage IIa clinical stage  Treatment plan: 1.Neoadjuvant chemotherapywith dose dense Adriamycin and Cytoxan x4 followed by Taxol x12starting 06/10/2019 2.Breast conserving surgery with sentinel lymph node biopsy 3.Adjuvant radiation 4.Follow-up adjuvant antiestrogen therapy(patient may be opting for oophorectomy plus anastrozole) ----------------------------------------------------------------------------------------------------------------------------------------------- Current treatment:Completed 4 cycles ofdose dense Adriamycin and Cytoxan, today cycle 7 Taxol Echocardiogram: 06/05/2019: EF 55 to 60% normal LV function  Chemo toxicities: 1.  Fatigue 2.  Elevation of LFTs: Monitoring the liver function tests. 3.  Hypokalemia on potassium supplement 4.  Neutropenia: ANC 1.4 today, improved 5. Blurred vision: secondary to Dexamethasone, should improve after chemotherapy completion  Liver biopsy: Benign 07/02/2019: MRI left femur: Benign  She is doing well today and I placed orders for her post neo MRI for mid April and requested her f/u with Dr. Donne Hazel also be scheduled.    We talked briefly about her surgical decision and she is hopeful of being  able to have breast conservation.    She is ok to treat with ANC of 1.4 and without urine pregnancy test today, as her last urine pregnancy test was one week ago, and was negative.  She is using back up birth control, and understands this and is in agreement.    Orders Placed This Encounter  Procedures  . MR BREAST BILATERAL W WO CONTRAST INC CAD    Standing Status:   Future    Standing Expiration Date:   12/06/2020    Order Specific Question:   If indicated for the ordered procedure, I authorize the administration of contrast media per Radiology protocol    Answer:   Yes    Order Specific Question:   What is the patient's sedation requirement?    Answer:   No Sedation    Order Specific Question:   Does the patient have a pacemaker or implanted devices?    Answer:   No    Order Specific Question:   Radiology Contrast Protocol - do NOT remove file path    Answer:   \\[email or web address]    Order Specific Question:   Preferred imaging location?    Answer:   [facility] (table limit-350 lbs)   .Total encounter time: 30 minutes*  *Total Encounter Time as defined by the Centers for Medicare and Medicaid Services includes, in addition to the face-to-face time of a patient visit (documented in the note above) non-face-to-face time: obtaining and reviewing outside history, ordering and reviewing medications, tests or procedures, care coordination (communications with other health care professionals or caregivers) and documentation in the medical record.  Wilber Bihari, NP 10/07/19 10:28 AM Medical Oncology and Hematology [facility] [place], [place] [address] Tel. [phone]    Fax. [phone]

## 2019-10-07 NOTE — Assessment & Plan Note (Addendum)
05/15/2019:Patient palpated a right breast lump for two years. Diagnostic mammogram showed a 4.5cm right breast mass at the 6 o'clock position involving the overlying skin, 2 benign appearing left breast mass at the 10 o'clock position measuring 2.4cm and 2.1cm, and no axillary adenopathy. 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%, HER-2 negative ratio 1.41 T2 N0 stage IIa clinical stage  Treatment plan: 1.Neoadjuvant chemotherapywith dose dense Adriamycin and Cytoxan x4 followed by Taxol x12starting 06/10/2019 2.Breast conserving surgery with sentinel lymph node biopsy 3.Adjuvant radiation 4.Follow-up adjuvant antiestrogen therapy(patient may be opting for oophorectomy plus anastrozole) ----------------------------------------------------------------------------------------------------------------------------------------------- Current treatment:Completed 4 cycles ofdose dense Adriamycin and Cytoxan, today cycle 7 Taxol Echocardiogram: 06/05/2019: EF 55 to 60% normal LV function  Chemo toxicities: 1.  Fatigue 2.  Elevation of LFTs: Monitoring the liver function tests. 3.  Hypokalemia on potassium supplement 4.  Neutropenia: ANC 1.4 today, improved 5. Blurred vision: secondary to Dexamethasone, should improve after chemotherapy completion  Liver biopsy: Benign 07/02/2019: MRI left femur: Benign  She is doing well today and I placed orders for her post neo MRI for mid April and requested her f/u with Dr. Donne Hazel also be scheduled.    We talked briefly about her surgical decision and she is hopeful of being able to have breast conservation.    She is ok to treat with ANC of 1.4 and without urine pregnancy test today, as her last urine pregnancy test was one week ago, and was negative.  She is using back up birth control, and understands this and is in agreement.

## 2019-10-07 NOTE — Progress Notes (Signed)
Per Wilber Bihari NP, OK to treat with ANC 1.4. No pregnancy test needed today

## 2019-10-07 NOTE — Telephone Encounter (Signed)
Per Wilber Bihari NP, made pt aware of appt with Dr. Donne Hazel on 10/27/19 and to arrive at 215pm. Pt verbalized understanding

## 2019-10-07 NOTE — Patient Instructions (Signed)
[facility] Discharge Instructions for Patients Receiving Chemotherapy  Today you received the following chemotherapy agents Paclitaxel (TAXOL).  To help prevent nausea and vomiting after your treatment, we encourage you to take your nausea medication as prescribed.  If you develop nausea and vomiting that is not controlled by your nausea medication, call the clinic.   BELOW ARE SYMPTOMS THAT SHOULD BE REPORTED IMMEDIATELY:  *FEVER GREATER THAN 100.5 F  *CHILLS WITH OR WITHOUT FEVER  NAUSEA AND VOMITING THAT IS NOT CONTROLLED WITH YOUR NAUSEA MEDICATION  *UNUSUAL SHORTNESS OF BREATH  *UNUSUAL BRUISING OR BLEEDING  TENDERNESS IN MOUTH AND THROAT WITH OR WITHOUT PRESENCE OF ULCERS  *URINARY PROBLEMS  *BOWEL PROBLEMS  UNUSUAL RASH Items with * indicate a potential emergency and should be followed up as soon as possible.  Feel free to call the clinic should you have any questions or concerns. The clinic phone number is [phone].  Please show the CHEMO ALERT CARD at check-in to the Emergency Department and triage nurse.   

## 2019-10-07 NOTE — Progress Notes (Signed)
Nutrition Follow-up:  Patient with right breast cancer, receiving taxol.  [place]    Met with patient during infusion.  Patient reports appetite has been a little bit better.  Has been able to drink 1 premier protein shake every morning.  Still trying to eat q 2 hours.    Medications: reviewed  Labs: reviewed  Anthropometrics:   Weight 136 lb 1.6 oz decreased from 139 lb on 3/10   NUTRITION DIAGNOSIS: Unintentional weight loss continues   INTERVENTION:  Patient to continue to liberalize diet.  Discussed ways to increase calories today by adding heart healthy fats.   Encouraged drinking 2 premier protein shakes per day Patient to continue taking bariatric MVI daily for life.  Discussed importance of good nutrition prior to surgery.   Patient has contact information    MONITORING, EVALUATION, GOAL: Patient will continue to meet nutritional needs with gastric bypass and receiving treatment for breast cancer   NEXT VISIT: Wednesday, April 21 during infusion  Vy Badley B. Zenia Resides, [place], [place] Registered Dietitian [phone] (pager)

## 2019-10-08 ENCOUNTER — Telehealth: Payer: Self-pay | Admitting: *Deleted

## 2019-10-08 ENCOUNTER — Telehealth: Payer: Self-pay | Admitting: Adult Health

## 2019-10-08 NOTE — Telephone Encounter (Signed)
Voicemail, Engineer, maintenance (IT), Airline pilot [phone]) for prior authorization requesting additional clinical information.  Open Mon -Fri, 8:00 am -7:00 pm EST.  Connected with "Kimberly Mueller.  A specialty pre-certification is needed for Zoladex medication." Treatment medication authorizations out of nursing scope of practice.  [employer] aware Zoladex request for [place] member ID: [id number] will be forward to appropriate staff.   [employer] awaiting return call for "Pending case number: [id number].   Need Zoladex dose, frequency and hormone status.   Hormone status test results along with pertinent supporting clinical records of medical necessity requested to be faxed to [phone]."

## 2019-10-08 NOTE — Telephone Encounter (Signed)
No 3/31 los. No changes made to pt's schedule.  

## 2019-10-08 NOTE — Progress Notes (Signed)
Pharmacist Chemotherapy Monitoring - Follow Up Assessment    I verify that I have reviewed each item in the below checklist:  . Regimen for the patient is scheduled for the appropriate day and plan matches scheduled date. Marland Kitchen Appropriate non-routine labs are ordered dependent on drug ordered. . If applicable, additional medications reviewed and ordered per protocol based on lifetime cumulative doses and/or treatment regimen.   Plan for follow-up and/or issues identified: Yes . I-vent associated with next due treatment: Yes   Adelina Mings 10/08/2019 9:07 AM

## 2019-10-14 ENCOUNTER — Inpatient Hospital Stay: Payer: 59

## 2019-10-14 ENCOUNTER — Inpatient Hospital Stay (HOSPITAL_BASED_OUTPATIENT_CLINIC_OR_DEPARTMENT_OTHER): Payer: 59 | Admitting: Medical

## 2019-10-14 ENCOUNTER — Inpatient Hospital Stay: Payer: 59 | Attending: Hematology and Oncology

## 2019-10-14 ENCOUNTER — Other Ambulatory Visit: Payer: Self-pay

## 2019-10-14 ENCOUNTER — Encounter: Payer: Self-pay | Admitting: *Deleted

## 2019-10-14 VITALS — BP 116/81 | HR 80 | Temp 99.1°F | Resp 18 | Wt 134.2 lb

## 2019-10-14 DIAGNOSIS — Z800 Family history of malignant neoplasm of digestive organs: Secondary | ICD-10-CM | POA: Diagnosis not present

## 2019-10-14 DIAGNOSIS — Z87891 Personal history of nicotine dependence: Secondary | ICD-10-CM | POA: Diagnosis not present

## 2019-10-14 DIAGNOSIS — K5900 Constipation, unspecified: Secondary | ICD-10-CM | POA: Diagnosis not present

## 2019-10-14 DIAGNOSIS — Z170 Estrogen receptor positive status [ER+]: Secondary | ICD-10-CM | POA: Insufficient documentation

## 2019-10-14 DIAGNOSIS — C50511 Malignant neoplasm of lower-outer quadrant of right female breast: Secondary | ICD-10-CM | POA: Diagnosis not present

## 2019-10-14 DIAGNOSIS — C50311 Malignant neoplasm of lower-inner quadrant of right female breast: Secondary | ICD-10-CM

## 2019-10-14 DIAGNOSIS — Z8042 Family history of malignant neoplasm of prostate: Secondary | ICD-10-CM | POA: Insufficient documentation

## 2019-10-14 DIAGNOSIS — Z9221 Personal history of antineoplastic chemotherapy: Secondary | ICD-10-CM | POA: Diagnosis not present

## 2019-10-14 DIAGNOSIS — K119 Disease of salivary gland, unspecified: Secondary | ICD-10-CM

## 2019-10-14 DIAGNOSIS — Z95828 Presence of other vascular implants and grafts: Secondary | ICD-10-CM

## 2019-10-14 DIAGNOSIS — N6322 Unspecified lump in the left breast, upper inner quadrant: Secondary | ICD-10-CM | POA: Insufficient documentation

## 2019-10-14 DIAGNOSIS — Z5111 Encounter for antineoplastic chemotherapy: Secondary | ICD-10-CM | POA: Diagnosis present

## 2019-10-14 LAB — CBC WITH DIFFERENTIAL (CANCER CENTER ONLY)
Abs Immature Granulocytes: 0.01 10*3/uL (ref 0.00–0.07)
Basophils Absolute: 0 10*3/uL (ref 0.0–0.1)
Basophils Relative: 1 %
Eosinophils Absolute: 0.1 10*3/uL (ref 0.0–0.5)
Eosinophils Relative: 2 %
HCT: 33.1 % — ABNORMAL LOW (ref 36.0–46.0)
Hemoglobin: 11.2 g/dL — ABNORMAL LOW (ref 12.0–15.0)
Immature Granulocytes: 0 %
Lymphocytes Relative: 15 %
Lymphs Abs: 0.5 10*3/uL — ABNORMAL LOW (ref 0.7–4.0)
MCH: 31.6 pg (ref 26.0–34.0)
MCHC: 33.8 g/dL (ref 30.0–36.0)
MCV: 93.5 fL (ref 80.0–100.0)
Monocytes Absolute: 0.4 10*3/uL (ref 0.1–1.0)
Monocytes Relative: 12 %
Neutro Abs: 2.3 10*3/uL (ref 1.7–7.7)
Neutrophils Relative %: 70 %
Platelet Count: 234 10*3/uL (ref 150–400)
RBC: 3.54 MIL/uL — ABNORMAL LOW (ref 3.87–5.11)
RDW: 13 % (ref 11.5–15.5)
WBC Count: 3.3 10*3/uL — ABNORMAL LOW (ref 4.0–10.5)
nRBC: 0 % (ref 0.0–0.2)

## 2019-10-14 LAB — CMP (CANCER CENTER ONLY)
ALT: 33 U/L (ref 0–44)
AST: 30 U/L (ref 15–41)
Albumin: 3.8 g/dL (ref 3.5–5.0)
Alkaline Phosphatase: 64 U/L (ref 38–126)
Anion gap: 9 (ref 5–15)
BUN: 6 mg/dL (ref 6–20)
CO2: 22 mmol/L (ref 22–32)
Calcium: 9.5 mg/dL (ref 8.9–10.3)
Chloride: 110 mmol/L (ref 98–111)
Creatinine: 0.62 mg/dL (ref 0.44–1.00)
GFR, Est AFR Am: >60 mL/min (ref >60)
GFR, Estimated: >60 mL/min (ref >60)
Glucose, Bld: 90 mg/dL (ref 70–99)
Potassium: 3.6 mmol/L (ref 3.5–5.1)
Sodium: 141 mmol/L (ref 135–145)
Total Bilirubin: 0.5 mg/dL (ref 0.3–1.2)
Total Protein: 6.7 g/dL (ref 6.5–8.1)

## 2019-10-14 LAB — PREGNANCY, URINE: Preg Test, Ur: NEGATIVE

## 2019-10-14 MED ORDER — DEXAMETHASONE SODIUM PHOSPHATE 10 MG/ML IJ SOLN
INTRAMUSCULAR | Status: AC
  Filled 2019-10-14: qty 1

## 2019-10-14 MED ORDER — FAMOTIDINE IN NACL 20-0.9 MG/50ML-% IV SOLN
20.0000 mg | Freq: Once | INTRAVENOUS | Status: AC
  Administered 2019-10-14: 20 mg via INTRAVENOUS

## 2019-10-14 MED ORDER — FAMOTIDINE IN NACL 20-0.9 MG/50ML-% IV SOLN
INTRAVENOUS | Status: AC
  Filled 2019-10-14: qty 50

## 2019-10-14 MED ORDER — SODIUM CHLORIDE 0.9% FLUSH
10.0000 mL | INTRAVENOUS | Status: DC | PRN
  Administered 2019-10-14: 08:00:00 10 mL
  Filled 2019-10-14: qty 10

## 2019-10-14 MED ORDER — SODIUM CHLORIDE 0.9 % IV SOLN
50.0000 mg/m2 | Freq: Once | INTRAVENOUS | Status: AC
  Administered 2019-10-14: 90 mg via INTRAVENOUS
  Filled 2019-10-14: qty 15

## 2019-10-14 MED ORDER — HEPARIN SOD (PORK) LOCK FLUSH 100 UNIT/ML IV SOLN
500.0000 [IU] | Freq: Once | INTRAVENOUS | Status: AC | PRN
  Administered 2019-10-14: 500 [IU]
  Filled 2019-10-14: qty 5

## 2019-10-14 MED ORDER — DIPHENHYDRAMINE HCL 50 MG/ML IJ SOLN
25.0000 mg | Freq: Once | INTRAMUSCULAR | Status: AC
  Administered 2019-10-14: 25 mg via INTRAVENOUS

## 2019-10-14 MED ORDER — SODIUM CHLORIDE 0.9 % IV SOLN
Freq: Once | INTRAVENOUS | Status: AC
  Filled 2019-10-14: qty 250

## 2019-10-14 MED ORDER — DEXAMETHASONE SODIUM PHOSPHATE 10 MG/ML IJ SOLN
4.0000 mg | Freq: Once | INTRAMUSCULAR | Status: AC
  Administered 2019-10-14: 4 mg via INTRAVENOUS

## 2019-10-14 MED ORDER — SODIUM CHLORIDE 0.9% FLUSH
10.0000 mL | INTRAVENOUS | Status: DC | PRN
  Administered 2019-10-14: 11:00:00 10 mL
  Filled 2019-10-14: qty 10

## 2019-10-14 MED ORDER — DIPHENHYDRAMINE HCL 50 MG/ML IJ SOLN
INTRAMUSCULAR | Status: AC
  Filled 2019-10-14: qty 1

## 2019-10-14 NOTE — Progress Notes (Signed)
Kimberly Mueller was seen in the infusion room while receiving chemotherapy. She reports right jaw pain with opening of her mouth for several days. She reports having a straw from a drink scrap the inside of her mouth last week. Her exam was negative except for an area of swelling surrounding the right salivary duct. There was slight swelling of the right preauricular lymph node. She was told that this could represent a salivary stone. She was told to massage her right cheek and to apply heat to the area as often as possible. She was told to return as needed. She expressed understanding and agreement with this plan.  Sandi Mealy, MHS, PA-C Physician Assistant

## 2019-10-14 NOTE — Patient Instructions (Signed)
[facility] Discharge Instructions for Patients Receiving Chemotherapy  Today you received the following chemotherapy agents Paclitaxel (TAXOL).  To help prevent nausea and vomiting after your treatment, we encourage you to take your nausea medication as prescribed.  If you develop nausea and vomiting that is not controlled by your nausea medication, call the clinic.   BELOW ARE SYMPTOMS THAT SHOULD BE REPORTED IMMEDIATELY:  *FEVER GREATER THAN 100.5 F  *CHILLS WITH OR WITHOUT FEVER  NAUSEA AND VOMITING THAT IS NOT CONTROLLED WITH YOUR NAUSEA MEDICATION  *UNUSUAL SHORTNESS OF BREATH  *UNUSUAL BRUISING OR BLEEDING  TENDERNESS IN MOUTH AND THROAT WITH OR WITHOUT PRESENCE OF ULCERS  *URINARY PROBLEMS  *BOWEL PROBLEMS  UNUSUAL RASH Items with * indicate a potential emergency and should be followed up as soon as possible.  Feel free to call the clinic should you have any questions or concerns. The clinic phone number is [phone].  Please show the CHEMO ALERT CARD at check-in to the Emergency Department and triage nurse.   

## 2019-10-15 ENCOUNTER — Encounter: Payer: Self-pay | Admitting: *Deleted

## 2019-10-15 NOTE — Progress Notes (Signed)
Pharmacist Chemotherapy Monitoring - Follow Up Assessment    I verify that I have reviewed each item in the below checklist:  . Regimen for the patient is scheduled for the appropriate day and plan matches scheduled date. Marland Kitchen Appropriate non-routine labs are ordered dependent on drug ordered. . If applicable, additional medications reviewed and ordered per protocol based on lifetime cumulative doses and/or treatment regimen.   Plan for follow-up and/or issues identified: No . I-vent associated with next due treatment: No . MD and/or nursing notified: No  Maicy Filip K 10/15/2019 10:38 AM

## 2019-10-20 NOTE — Progress Notes (Signed)
Patient Care Team: Jilda Panda, MD as PCP - General (Internal Medicine) Nicholas Lose, MD as Consulting Physician (Hematology and Oncology) Rolm Bookbinder, MD as Consulting Physician (General Surgery) Gery Pray, MD as Consulting Physician (Radiation Oncology) Rockwell Germany, RN as Oncology Nurse Navigator Mauro Kaufmann, RN as Oncology Nurse Navigator  DIAGNOSIS:    ICD-10-CM   1. Malignant neoplasm of lower-inner quadrant of right breast of female, estrogen receptor positive ([place])  C50.311    Z17.0     SUMMARY OF ONCOLOGIC HISTORY: Oncology History  Malignant neoplasm of lower-inner quadrant of right breast of female, estrogen receptor positive ([place])  05/25/2019 Initial Diagnosis   Patient palpated a right breast lump for two years. Diagnostic mammogram showed a 4.5cm right breast mass at the 6 o'clock position involving the overlying skin, 2 benign appearing left breast mass at the 10 o'clock position measuring 2.4cm and 2.1cm, and no axillary adenopathy. Biopsy showed: in the left breast, fibroadenoma and no evidence of malignancy, and in the right breast, IDC with DCIS, grade 3.  ER 70%, PR 30%, Ki-67 40%, HER-2 negative by [facility]    06/10/2019 -  Chemotherapy   The patient had DOXOrubicin (ADRIAMYCIN) chemo injection 118 mg, 60 mg/m2 = 118 mg, Intravenous,  Once, 4 of 4 cycles Dose modification: 50 mg/m2 (original dose 60 mg/m2, Cycle 2, Reason: Dose not tolerated) Administration: 118 mg (06/10/2019), 98 mg (06/24/2019), 98 mg (07/08/2019), 98 mg (07/22/2019) palonosetron (ALOXI) injection 0.25 mg, 0.25 mg, Intravenous,  Once, 4 of 4 cycles Administration: 0.25 mg (06/10/2019), 0.25 mg (06/24/2019), 0.25 mg (07/08/2019), 0.25 mg (07/22/2019) pegfilgrastim-cbqv (UDENYCA) injection 6 mg, 6 mg, Subcutaneous, Once, 4 of 4 cycles Administration: 6 mg (06/12/2019), 6 mg (06/26/2019), 6 mg (07/10/2019), 6 mg (07/24/2019) cyclophosphamide (CYTOXAN) 1,180 mg in sodium chloride 0.9 % 250  mL chemo infusion, 600 mg/m2 = 1,180 mg, Intravenous,  Once, 4 of 4 cycles Dose modification: 500 mg/m2 (original dose 600 mg/m2, Cycle 2, Reason: Dose not tolerated) Administration: 1,180 mg (06/10/2019), 980 mg (06/24/2019), 980 mg (07/08/2019), 980 mg (07/22/2019) PACLitaxel (TAXOL) 156 mg in sodium chloride 0.9 % 250 mL chemo infusion (</= 5m/m2), 80 mg/m2 = 156 mg, Intravenous,  Once, 10 of 12 cycles Dose modification: 65 mg/m2 (original dose 80 mg/m2, Cycle 11, Reason: Provider Judgment), 50 mg/m2 (original dose 80 mg/m2, Cycle 12, Reason: Dose not tolerated) Administration: 156 mg (08/12/2019), 156 mg (08/19/2019), 138 mg (08/26/2019), 138 mg (09/02/2019), 138 mg (09/09/2019), 138 mg (09/16/2019), 114 mg (09/23/2019), 90 mg (09/30/2019), 90 mg (10/07/2019), 90 mg (10/14/2019) fosaprepitant (EMEND) 150 mg, dexamethasone (DECADRON) 12 mg in sodium chloride 0.9 % 145 mL IVPB, , Intravenous,  Once, 4 of 4 cycles Administration:  (06/10/2019),  (06/24/2019),  (07/08/2019),  (07/22/2019)  for chemotherapy treatment.     Genetic Testing   No pathogenic variants identified. [place] [id number] identified on the [facility] Common Hereditary Cancers Panel. The report date is 06/15/2019.  The Common Hereditary Cancers Panel offered by [facility] includes sequencing and/or deletion duplication testing of the following 48 genes: APC, ATM, AXIN2, BARD1, BMPR1A, BRCA1, BRCA2, BRIP1, CDH1, CDKN2A (p14ARF), CDKN2A (p16INK4a), CKD4, CHEK2, CTNNA1, DICER1, EPCAM (Deletion/duplication testing only), GREM1 (promoter region deletion/duplication testing only), KIT, MEN1, MLH1, MSH2, MSH3, MSH6, MUTYH, NBN, NF1, NHTL1, PALB2, PDGFRA, PMS2, POLD1, POLE, PTEN, RAD50, RAD51C, RAD51D, RNF43, SDHB, SDHC, SDHD, SMAD4, SMARCA4. STK11, TP53, TSC1, TSC2, and VHL.  The following genes were evaluated for sequence changes only: SDHA and HOXB13 c.251G>A variant only.  CHIEF COMPLIANT: Cycle 11 Taxol  INTERVAL HISTORY: Kimberly Mueller  is a 34 y.o. with above-mentioned history of right breast cancer currently on neoadjuvant chemotherapy withweekly Taxol after she completed 4 cycles ofAdriamycin and Cytoxan.She presents to the clinic todayfor cycle11.   ALLERGIES:  has No Known Allergies.  MEDICATIONS:  Current Outpatient Medications  Medication Sig Dispense Refill  . docusate sodium (COLACE) 250 MG capsule Take 250 mg by mouth 2 (two) times daily.    Marland Kitchen etonogestrel (NEXPLANON) 68 MG IMPL implant 1 each by Subdermal route once.    . gabapentin (NEURONTIN) 100 MG capsule Take 1 capsule (100 mg total) by mouth at bedtime. 30 capsule 3  . Lactobacillus (PROBIOTIC ACIDOPHILUS) CAPS Take 1 capsule by mouth daily. 30 capsule 0  . lidocaine-prilocaine (EMLA) cream Apply to affected area once 30 g 3  . omeprazole (PRILOSEC) 40 MG capsule Take 1 capsule (40 mg total) by mouth daily. 90 capsule 3  . prochlorperazine (COMPAZINE) 10 MG tablet Take 1 tablet (10 mg total) by mouth every 6 (six) hours as needed (Nausea or vomiting). 30 tablet 1  . Sennosides (LAXATIVE) 25 MG TABS Take 1 tablet by mouth 2 (two) times daily as needed (constipation).    . simethicone (MYLICON) 80 MG chewable tablet Chew 80 mg by mouth every 6 (six) hours as needed for flatulence.     No current facility-administered medications for this visit.    PHYSICAL EXAMINATION: ECOG PERFORMANCE STATUS: 1 - Symptomatic but completely ambulatory  Vitals:   10/21/19 0832  BP: 117/87  Pulse: 83  Resp: 18  Temp: 99.6 F (37.6 C)  SpO2: 100%   There were no vitals filed for this visit.  LABORATORY DATA:  I have reviewed the data as listed CMP Latest Ref Rng & Units 10/14/2019 10/07/2019 09/30/2019  Glucose 70 - 99 mg/dL 90 87 86  BUN 6 - 20 mg/dL 6 6 5(L)  Creatinine 0.44 - 1.00 mg/dL 0.62 0.62 0.58  Sodium 135 - 145 mmol/L 141 140 142  Potassium 3.5 - 5.1 mmol/L 3.6 3.7 3.4(L)  Chloride 98 - 111 mmol/L 110 109 110  CO2 22 - 32 mmol/L 22 21(L) 23    Calcium 8.9 - 10.3 mg/dL 9.5 9.3 9.3  Total Protein 6.5 - 8.1 g/dL 6.7 6.5 6.3(L)  Total Bilirubin 0.3 - 1.2 mg/dL 0.5 0.6 0.7  Alkaline Phos 38 - 126 U/L 64 64 68  AST 15 - 41 U/L 30 42(H) 48(H)  ALT 0 - 44 U/L 33 45(H) 58(H)    Lab Results  Component Value Date   WBC 3.3 (L) 10/14/2019   HGB 11.2 (L) 10/14/2019   HCT 33.1 (L) 10/14/2019   MCV 93.5 10/14/2019   PLT 234 10/14/2019   NEUTROABS 2.3 10/14/2019    ASSESSMENT & PLAN:  Malignant neoplasm of lower-inner quadrant of right breast of female, estrogen receptor positive ([place]) 05/15/2019:Patient palpated a right breast lump for two years. Diagnostic mammogram showed a 4.5cm right breast mass at the 6 o'clock position involving the overlying skin, 2 benign appearing left breast mass at the 10 o'clock position measuring 2.4cm and 2.1cm, and no axillary adenopathy. 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%, HER-2 negative ratio 1.41 T2 N0 stage IIa clinical stage  Treatment plan: 1.Neoadjuvant chemotherapywith dose dense Adriamycin and Cytoxan x4 followed by Taxol x12starting 06/10/2019 2.Breast conserving surgery with sentinel lymph  node biopsy 3.Adjuvant radiation 4.Follow-up adjuvant antiestrogen therapy(patient may be opting for oophorectomy plus anastrozole) ----------------------------------------------------------------------------------------------------------------------------------------------- Current treatment:Completed 4 cycles ofdose dense Adriamycin and Cytoxan, today cycle 11 Taxol Echocardiogram: 06/05/2019: EF 55 to 60% normal LV function  Chemo toxicities: 1.Fatigue 2.Elevation of LFTs: Became normal last week. 3.Hypokalemia on potassium supplement 4.  Neutropenia: ANC 2.3 today, improved 5. Blurred vision: secondary to Dexamethasone, should improve after chemotherapy  completion 6.  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  I will refer her to see ENT  Breast MRI being done today Liverbiopsy: Benign 07/02/2019: MRI left femur: Benign  Port can be removed during surgery.  RTC after surgery    No orders of the defined types were placed in this encounter.  The patient has a good understanding of the overall plan. she agrees with it. she will call with any problems that may develop before the next visit here.  Total time spent: 30 mins including face to face time and time spent for planning, charting and coordination of care  Nicholas Lose, MD 10/21/2019  I, Cloyde Reams Dorshimer, am acting as scribe for Dr. Nicholas Lose.  I have reviewed the above documentation for accuracy and completeness, and I agree with the above.

## 2019-10-21 ENCOUNTER — Inpatient Hospital Stay: Payer: 59

## 2019-10-21 ENCOUNTER — Ambulatory Visit (HOSPITAL_COMMUNITY)
Admission: RE | Admit: 2019-10-21 | Discharge: 2019-10-21 | Disposition: A | Discharge status: Home / Self Care | Payer: No Typology Code available for payment source | Source: Ambulatory Visit | Attending: Adult Health | Admitting: Adult Health

## 2019-10-21 ENCOUNTER — Other Ambulatory Visit: Payer: Self-pay

## 2019-10-21 ENCOUNTER — Other Ambulatory Visit: Payer: Self-pay | Admitting: *Deleted

## 2019-10-21 ENCOUNTER — Inpatient Hospital Stay (HOSPITAL_BASED_OUTPATIENT_CLINIC_OR_DEPARTMENT_OTHER): Payer: 59 | Admitting: Hematology and Oncology

## 2019-10-21 VITALS — BP 117/87 | HR 83 | Temp 99.6°F | Resp 18 | Ht 65.0 in

## 2019-10-21 DIAGNOSIS — Z170 Estrogen receptor positive status [ER+]: Secondary | ICD-10-CM

## 2019-10-21 DIAGNOSIS — C50311 Malignant neoplasm of lower-inner quadrant of right female breast: Secondary | ICD-10-CM

## 2019-10-21 DIAGNOSIS — S0340XA Sprain of jaw, unspecified side, initial encounter: Secondary | ICD-10-CM

## 2019-10-21 DIAGNOSIS — Z95828 Presence of other vascular implants and grafts: Secondary | ICD-10-CM

## 2019-10-21 LAB — CBC WITH DIFFERENTIAL (CANCER CENTER ONLY)
Abs Immature Granulocytes: 0.01 10*3/uL (ref 0.00–0.07)
Basophils Absolute: 0 10*3/uL (ref 0.0–0.1)
Basophils Relative: 1 %
Eosinophils Absolute: 0.1 10*3/uL (ref 0.0–0.5)
Eosinophils Relative: 2 %
HCT: 33.6 % — ABNORMAL LOW (ref 36.0–46.0)
Hemoglobin: 11.4 g/dL — ABNORMAL LOW (ref 12.0–15.0)
Immature Granulocytes: 0 %
Lymphocytes Relative: 17 %
Lymphs Abs: 0.6 10*3/uL — ABNORMAL LOW (ref 0.7–4.0)
MCH: 31.9 pg (ref 26.0–34.0)
MCHC: 33.9 g/dL (ref 30.0–36.0)
MCV: 94.1 fL (ref 80.0–100.0)
Monocytes Absolute: 0.4 10*3/uL (ref 0.1–1.0)
Monocytes Relative: 11 %
Neutro Abs: 2.3 10*3/uL (ref 1.7–7.7)
Neutrophils Relative %: 69 %
Platelet Count: 249 10*3/uL (ref 150–400)
RBC: 3.57 MIL/uL — ABNORMAL LOW (ref 3.87–5.11)
RDW: 13.1 % (ref 11.5–15.5)
WBC Count: 3.4 10*3/uL — ABNORMAL LOW (ref 4.0–10.5)
nRBC: 0 % (ref 0.0–0.2)

## 2019-10-21 LAB — CMP (CANCER CENTER ONLY)
ALT: 36 U/L (ref 0–44)
AST: 31 U/L (ref 15–41)
Albumin: 3.9 g/dL (ref 3.5–5.0)
Alkaline Phosphatase: 66 U/L (ref 38–126)
Anion gap: 9 (ref 5–15)
BUN: 8 mg/dL (ref 6–20)
CO2: 23 mmol/L (ref 22–32)
Calcium: 9.7 mg/dL (ref 8.9–10.3)
Chloride: 109 mmol/L (ref 98–111)
Creatinine: 0.61 mg/dL (ref 0.44–1.00)
GFR, Est AFR Am: >60 mL/min (ref >60)
GFR, Estimated: >60 mL/min (ref >60)
Glucose, Bld: 88 mg/dL (ref 70–99)
Potassium: 3.5 mmol/L (ref 3.5–5.1)
Sodium: 141 mmol/L (ref 135–145)
Total Bilirubin: 0.5 mg/dL (ref 0.3–1.2)
Total Protein: 6.8 g/dL (ref 6.5–8.1)

## 2019-10-21 LAB — PREGNANCY, URINE: Preg Test, Ur: NEGATIVE

## 2019-10-21 MED ORDER — SODIUM CHLORIDE 0.9% FLUSH
10.0000 mL | INTRAVENOUS | Status: DC | PRN
  Administered 2019-10-21: 10 mL
  Filled 2019-10-21: qty 10

## 2019-10-21 MED ORDER — DEXAMETHASONE SODIUM PHOSPHATE 10 MG/ML IJ SOLN
INTRAMUSCULAR | Status: AC
  Filled 2019-10-21: qty 1

## 2019-10-21 MED ORDER — HEPARIN SOD (PORK) LOCK FLUSH 100 UNIT/ML IV SOLN
500.0000 [IU] | Freq: Once | INTRAVENOUS | Status: AC | PRN
  Administered 2019-10-21: 500 [IU]
  Filled 2019-10-21: qty 5

## 2019-10-21 MED ORDER — HEPARIN SOD (PORK) LOCK FLUSH 100 UNIT/ML IV SOLN
500.0000 [IU] | Freq: Once | INTRAVENOUS | Status: DC | PRN
  Filled 2019-10-21: qty 5

## 2019-10-21 MED ORDER — SODIUM CHLORIDE 0.9 % IV SOLN
50.0000 mg/m2 | Freq: Once | INTRAVENOUS | Status: AC
  Administered 2019-10-21: 90 mg via INTRAVENOUS
  Filled 2019-10-21: qty 15

## 2019-10-21 MED ORDER — FAMOTIDINE IN NACL 20-0.9 MG/50ML-% IV SOLN
INTRAVENOUS | Status: AC
  Filled 2019-10-21: qty 50

## 2019-10-21 MED ORDER — DIPHENHYDRAMINE HCL 50 MG/ML IJ SOLN
INTRAMUSCULAR | Status: AC
  Filled 2019-10-21: qty 1

## 2019-10-21 MED ORDER — DIPHENHYDRAMINE HCL 50 MG/ML IJ SOLN
25.0000 mg | Freq: Once | INTRAMUSCULAR | Status: AC
  Administered 2019-10-21: 25 mg via INTRAVENOUS

## 2019-10-21 MED ORDER — GADOBUTROL 1 MMOL/ML IV SOLN
6.0000 mL | Freq: Once | INTRAVENOUS | Status: AC | PRN
  Administered 2019-10-21: 6 mL via INTRAVENOUS

## 2019-10-21 MED ORDER — SODIUM CHLORIDE 0.9 % IV SOLN
Freq: Once | INTRAVENOUS | Status: AC
  Filled 2019-10-21: qty 250

## 2019-10-21 MED ORDER — DEXAMETHASONE SODIUM PHOSPHATE 10 MG/ML IJ SOLN
4.0000 mg | Freq: Once | INTRAMUSCULAR | Status: AC
  Administered 2019-10-21: 4 mg via INTRAVENOUS

## 2019-10-21 MED ORDER — FAMOTIDINE IN NACL 20-0.9 MG/50ML-% IV SOLN
20.0000 mg | Freq: Once | INTRAVENOUS | Status: AC
  Administered 2019-10-21: 20 mg via INTRAVENOUS

## 2019-10-21 NOTE — Assessment & Plan Note (Signed)
05/15/2019:Patient palpated a right breast lump for two years. Diagnostic mammogram showed a 4.5cm right breast mass at the 6 o'clock position involving the overlying skin, 2 benign appearing left breast mass at the 10 o'clock position measuring 2.4cm and 2.1cm, and no axillary adenopathy. 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%, HER-2 negative ratio 1.41 T2 N0 stage IIa clinical stage  Treatment plan: 1.Neoadjuvant chemotherapywith dose dense Adriamycin and Cytoxan x4 followed by Taxol x12starting 06/10/2019 2.Breast conserving surgery with sentinel lymph node biopsy 3.Adjuvant radiation 4.Follow-up adjuvant antiestrogen therapy(patient may be opting for oophorectomy plus anastrozole) ----------------------------------------------------------------------------------------------------------------------------------------------- Current treatment:Completed 4 cycles ofdose dense Adriamycin and Cytoxan, today cycle 11 Taxol Echocardiogram: 06/05/2019: EF 55 to 60% normal LV function  Chemo toxicities: 1.Fatigue 2.Elevation of LFTs: Monitoring the liver function tests. 3.Hypokalemia on potassium supplement 4.  Neutropenia: ANC 1.4 today, improved 5. Blurred vision: secondary to Dexamethasone, should improve after chemotherapy completion 6. Salivary gland stone  Breast MRI being done today Liverbiopsy: Benign 07/02/2019: MRI left femur: Benign  Port can be removed during surgery.  RTC after surgery

## 2019-10-21 NOTE — Progress Notes (Signed)
Referral placed to Dr. 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  Referral successfully faxed to [phone].

## 2019-10-22 ENCOUNTER — Encounter: Payer: Self-pay | Admitting: *Deleted

## 2019-10-22 NOTE — Progress Notes (Signed)
Pharmacist Chemotherapy Monitoring - Follow Up Assessment    I verify that I have reviewed each item in the below checklist:  . Regimen for the patient is scheduled for the appropriate day and plan matches scheduled date. Marland Kitchen Appropriate non-routine labs are ordered dependent on drug ordered. . If applicable, additional medications reviewed and ordered per protocol based on lifetime cumulative doses and/or treatment regimen.   Plan for follow-up and/or issues identified: Yes . I-vent associated with next due treatment: Yes . MD and/or nursing notified: No   Kennith Center, Pharm.D., CPP 10/22/2019@3 :15 PM

## 2019-10-22 NOTE — Addendum Note (Signed)
Addended by: Tora Kindred on: 10/22/2019 03:14 PM   Modules accepted: Orders

## 2019-10-27 ENCOUNTER — Other Ambulatory Visit: Payer: Self-pay | Admitting: General Surgery

## 2019-10-27 DIAGNOSIS — Z170 Estrogen receptor positive status [ER+]: Secondary | ICD-10-CM

## 2019-10-27 DIAGNOSIS — C50311 Malignant neoplasm of lower-inner quadrant of right female breast: Secondary | ICD-10-CM

## 2019-10-28 ENCOUNTER — Encounter: Payer: Self-pay | Admitting: *Deleted

## 2019-10-28 ENCOUNTER — Encounter: Payer: Self-pay | Admitting: Adult Health

## 2019-10-28 ENCOUNTER — Inpatient Hospital Stay: Payer: 59

## 2019-10-28 ENCOUNTER — Ambulatory Visit: Payer: 59

## 2019-10-28 ENCOUNTER — Other Ambulatory Visit: Payer: Self-pay

## 2019-10-28 ENCOUNTER — Inpatient Hospital Stay (HOSPITAL_BASED_OUTPATIENT_CLINIC_OR_DEPARTMENT_OTHER): Payer: 59 | Admitting: Adult Health

## 2019-10-28 VITALS — BP 127/83 | HR 82 | Temp 98.0°F | Resp 20 | Ht 65.0 in | Wt 131.4 lb

## 2019-10-28 DIAGNOSIS — Z170 Estrogen receptor positive status [ER+]: Secondary | ICD-10-CM

## 2019-10-28 DIAGNOSIS — C50311 Malignant neoplasm of lower-inner quadrant of right female breast: Secondary | ICD-10-CM

## 2019-10-28 DIAGNOSIS — Z95828 Presence of other vascular implants and grafts: Secondary | ICD-10-CM

## 2019-10-28 DIAGNOSIS — Z5111 Encounter for antineoplastic chemotherapy: Secondary | ICD-10-CM | POA: Diagnosis not present

## 2019-10-28 LAB — CMP (CANCER CENTER ONLY)
ALT: 32 U/L (ref 0–44)
AST: 29 U/L (ref 15–41)
Albumin: 4 g/dL (ref 3.5–5.0)
Alkaline Phosphatase: 63 U/L (ref 38–126)
Anion gap: 10 (ref 5–15)
BUN: 9 mg/dL (ref 6–20)
CO2: 23 mmol/L (ref 22–32)
Calcium: 9.6 mg/dL (ref 8.9–10.3)
Chloride: 107 mmol/L (ref 98–111)
Creatinine: 0.64 mg/dL (ref 0.44–1.00)
GFR, Est AFR Am: >60 mL/min (ref >60)
GFR, Estimated: >60 mL/min (ref >60)
Glucose, Bld: 94 mg/dL (ref 70–99)
Potassium: 3.6 mmol/L (ref 3.5–5.1)
Sodium: 140 mmol/L (ref 135–145)
Total Bilirubin: 0.6 mg/dL (ref 0.3–1.2)
Total Protein: 6.9 g/dL (ref 6.5–8.1)

## 2019-10-28 LAB — CBC WITH DIFFERENTIAL (CANCER CENTER ONLY)
Abs Immature Granulocytes: 0.02 10*3/uL (ref 0.00–0.07)
Basophils Absolute: 0 10*3/uL (ref 0.0–0.1)
Basophils Relative: 1 %
Eosinophils Absolute: 0.1 10*3/uL (ref 0.0–0.5)
Eosinophils Relative: 3 %
HCT: 33.9 % — ABNORMAL LOW (ref 36.0–46.0)
Hemoglobin: 11.6 g/dL — ABNORMAL LOW (ref 12.0–15.0)
Immature Granulocytes: 1 %
Lymphocytes Relative: 12 %
Lymphs Abs: 0.5 10*3/uL — ABNORMAL LOW (ref 0.7–4.0)
MCH: 31.3 pg (ref 26.0–34.0)
MCHC: 34.2 g/dL (ref 30.0–36.0)
MCV: 91.4 fL (ref 80.0–100.0)
Monocytes Absolute: 0.4 10*3/uL (ref 0.1–1.0)
Monocytes Relative: 10 %
Neutro Abs: 2.9 10*3/uL (ref 1.7–7.7)
Neutrophils Relative %: 73 %
Platelet Count: 240 10*3/uL (ref 150–400)
RBC: 3.71 MIL/uL — ABNORMAL LOW (ref 3.87–5.11)
RDW: 13.1 % (ref 11.5–15.5)
WBC Count: 3.9 10*3/uL — ABNORMAL LOW (ref 4.0–10.5)
nRBC: 0 % (ref 0.0–0.2)

## 2019-10-28 LAB — PREGNANCY, URINE: Preg Test, Ur: NEGATIVE

## 2019-10-28 MED ORDER — GOSERELIN ACETATE 3.6 MG ~~LOC~~ IMPL
3.6000 mg | DRUG_IMPLANT | Freq: Once | SUBCUTANEOUS | Status: AC
  Administered 2019-10-28: 3.6 mg via SUBCUTANEOUS

## 2019-10-28 MED ORDER — FAMOTIDINE IN NACL 20-0.9 MG/50ML-% IV SOLN
INTRAVENOUS | Status: AC
  Filled 2019-10-28: qty 50

## 2019-10-28 MED ORDER — SODIUM CHLORIDE 0.9 % IV SOLN
50.0000 mg/m2 | Freq: Once | INTRAVENOUS | Status: AC
  Administered 2019-10-28: 90 mg via INTRAVENOUS
  Filled 2019-10-28: qty 15

## 2019-10-28 MED ORDER — FAMOTIDINE IN NACL 20-0.9 MG/50ML-% IV SOLN
20.0000 mg | Freq: Once | INTRAVENOUS | Status: AC
  Administered 2019-10-28: 20 mg via INTRAVENOUS

## 2019-10-28 MED ORDER — SODIUM CHLORIDE 0.9% FLUSH
10.0000 mL | INTRAVENOUS | Status: DC | PRN
  Administered 2019-10-28: 10 mL
  Filled 2019-10-28: qty 10

## 2019-10-28 MED ORDER — DEXAMETHASONE SODIUM PHOSPHATE 10 MG/ML IJ SOLN
INTRAMUSCULAR | Status: AC
  Filled 2019-10-28: qty 1

## 2019-10-28 MED ORDER — BIMATOPROST 0.03 % EX SOLN: 1.0000 "application " | Freq: Every day | CUTANEOUS | 12 refills | Status: DC

## 2019-10-28 MED ORDER — GOSERELIN ACETATE 3.6 MG ~~LOC~~ IMPL
DRUG_IMPLANT | SUBCUTANEOUS | Status: AC
  Filled 2019-10-28: qty 3.6

## 2019-10-28 MED ORDER — DIPHENHYDRAMINE HCL 50 MG/ML IJ SOLN
INTRAMUSCULAR | Status: AC
  Filled 2019-10-28: qty 1

## 2019-10-28 MED ORDER — DEXAMETHASONE SODIUM PHOSPHATE 10 MG/ML IJ SOLN
4.0000 mg | Freq: Once | INTRAMUSCULAR | Status: AC
  Administered 2019-10-28: 4 mg via INTRAVENOUS

## 2019-10-28 MED ORDER — DIPHENHYDRAMINE HCL 50 MG/ML IJ SOLN
25.0000 mg | Freq: Once | INTRAMUSCULAR | Status: AC
  Administered 2019-10-28: 25 mg via INTRAVENOUS

## 2019-10-28 MED ORDER — HEPARIN SOD (PORK) LOCK FLUSH 100 UNIT/ML IV SOLN
500.0000 [IU] | Freq: Once | INTRAVENOUS | Status: AC | PRN
  Administered 2019-10-28: 12:00:00 500 [IU]
  Filled 2019-10-28: qty 5

## 2019-10-28 MED ORDER — SODIUM CHLORIDE 0.9 % IV SOLN
Freq: Once | INTRAVENOUS | Status: AC
  Filled 2019-10-28: qty 250

## 2019-10-28 MED ORDER — SODIUM CHLORIDE 0.9% FLUSH
10.0000 mL | INTRAVENOUS | Status: DC | PRN
  Administered 2019-10-28: 09:00:00 10 mL
  Filled 2019-10-28: qty 10

## 2019-10-28 NOTE — Progress Notes (Signed)
Nutrition Follow-up:  Patient with right breast cancer, receiving taxol.  [place]  Met with patient during last infusion.  Patient reports appetite is better. Bowels are moving more normally.  Getting in 1 premier protein shake sometimes 2.  Trying to eat q 2 hours and wants to focus on healthy foods.    Medications: reviewed  Labs: reviewed  Anthropometrics:   Weight 131 lb 6.4 oz today decreased from 136 lb 1.6 oz on 3/31.     NUTRITION DIAGNOSIS: Unintentional weight loss continues   INTERVENTION:  Patient to eat q 2 hours, focusing on protein rich foods first Continue premier protein shake 1-2 per day Continue bariatric MVI daily and calcium 513m TID for life Provided patient with contact information    MONITORING, EVALUATION, GOAL: Patient will continue to meet nutritional needs with gastric bypass and receiving treatment for breast cancer.    NEXT VISIT: as needed  Harriette Tovey B. AZenia Resides RSehili LNowthenRegistered Dietitian [phone](pager)

## 2019-10-28 NOTE — Progress Notes (Signed)
[facility] Follow up:    Jilda Panda, MD [address] [place] [address]   DIAGNOSIS: Cancer Staging Malignant neoplasm of lower-inner quadrant of right breast of female, estrogen receptor positive ([place]) Staging form: Breast, AJCC 8th Edition - Clinical stage from 05/27/2019: Stage IIA (cT2, cN0, cM0, G3, ER+, PR+, HER2: Equivocal) - Unsigned   SUMMARY OF ONCOLOGIC HISTORY: Oncology History  Malignant neoplasm of lower-inner quadrant of right breast of female, estrogen receptor positive ([place])  05/25/2019 Initial Diagnosis   Patient palpated a right breast lump for two years. Diagnostic mammogram showed a 4.5cm right breast mass at the 6 o'clock position involving the overlying skin, 2 benign appearing left breast mass at the 10 o'clock position measuring 2.4cm and 2.1cm, and no axillary adenopathy. Biopsy showed: in the left breast, fibroadenoma and no evidence of malignancy, and in the right breast, IDC with DCIS, grade 3.  ER 70%, PR 30%, Ki-67 40%, HER-2 negative by [facility]    06/10/2019 -  Chemotherapy   The patient had DOXOrubicin (ADRIAMYCIN) chemo injection 118 mg, 60 mg/m2 = 118 mg, Intravenous,  Once, 4 of 4 cycles Dose modification: 50 mg/m2 (original dose 60 mg/m2, Cycle 2, Reason: Dose not tolerated) Administration: 118 mg (06/10/2019), 98 mg (06/24/2019), 98 mg (07/08/2019), 98 mg (07/22/2019) palonosetron (ALOXI) injection 0.25 mg, 0.25 mg, Intravenous,  Once, 4 of 4 cycles Administration: 0.25 mg (06/10/2019), 0.25 mg (06/24/2019), 0.25 mg (07/08/2019), 0.25 mg (07/22/2019) pegfilgrastim-cbqv (UDENYCA) injection 6 mg, 6 mg, Subcutaneous, Once, 4 of 4 cycles Administration: 6 mg (06/12/2019), 6 mg (06/26/2019), 6 mg (07/10/2019), 6 mg (07/24/2019) cyclophosphamide (CYTOXAN) 1,180 mg in sodium chloride 0.9 % 250 mL chemo infusion, 600 mg/m2 = 1,180 mg, Intravenous,  Once, 4 of 4 cycles Dose modification: 500 mg/m2 (original dose 600 mg/m2, Cycle 2, Reason:  Dose not tolerated) Administration: 1,180 mg (06/10/2019), 980 mg (06/24/2019), 980 mg (07/08/2019), 980 mg (07/22/2019) PACLitaxel (TAXOL) 156 mg in sodium chloride 0.9 % 250 mL chemo infusion (</= 80m/m2), 80 mg/m2 = 156 mg, Intravenous,  Once, 12 of 12 cycles Dose modification: 65 mg/m2 (original dose 80 mg/m2, Cycle 11, Reason: Provider Judgment), 50 mg/m2 (original dose 80 mg/m2, Cycle 12, Reason: Dose not tolerated) Administration: 156 mg (08/12/2019), 156 mg (08/19/2019), 138 mg (08/26/2019), 138 mg (09/02/2019), 138 mg (09/09/2019), 138 mg (09/16/2019), 114 mg (09/23/2019), 90 mg (09/30/2019), 90 mg (10/07/2019), 90 mg (10/14/2019), 90 mg (10/21/2019) fosaprepitant (EMEND) 150 mg, dexamethasone (DECADRON) 12 mg in sodium chloride 0.9 % 145 mL IVPB, , Intravenous,  Once, 4 of 4 cycles Administration:  (06/10/2019),  (06/24/2019),  (07/08/2019),  (07/22/2019)  for chemotherapy treatment.     Genetic Testing   No pathogenic variants identified. VUS in DFancy [place] c.[id number] identified on the [facility] Common Hereditary Cancers Panel. The report date is 06/15/2019.  The Common Hereditary Cancers Panel offered by [facility] includes sequencing and/or deletion duplication testing of the following 48 genes: APC, ATM, AXIN2, BARD1, BMPR1A, BRCA1, BRCA2, BRIP1, CDH1, CDKN2A (p14ARF), CDKN2A (p16INK4a), CKD4, CHEK2, CTNNA1, DICER1, EPCAM (Deletion/duplication testing only), GREM1 (promoter region deletion/duplication testing only), KIT, MEN1, MLH1, MSH2, MSH3, MSH6, MUTYH, NBN, NF1, NHTL1, PALB2, PDGFRA, PMS2, POLD1, POLE, PTEN, RAD50, RAD51C, RAD51D, RNF43, SDHB, SDHC, SDHD, SMAD4, SMARCA4. STK11, TP53, TSC1, TSC2, and VHL.  The following genes were evaluated for sequence changes only: SDHA and HOXB13 c.251G>A variant only.     CURRENT THERAPY: Neoadjuvant taxol; Zoladex  INTERVAL HISTORY: [name]89y.o. female returns for evalaution prior to  receiving her final cycle of neoadjuvant chemotherapy with  Paclitaxel.  She underwent her post neo MRI on 4/15 and it showed a decrease from 3.6cm to 1.9 cm.  She met with Dr. Donne Hazel yesterday and is planning on lumpectomy in early May to be followed by adjuvant radiation therapy.  She is going to have her Nexplanon removed at surgery time.  We are going to start Zoladex today.    She does have some constipation that she manages with Miralax, Colace, and increased water intake.     Patient Active Problem List   Diagnosis Date Noted  . Port-A-Cath in place 07/22/2019  . Genetic testing 06/15/2019  . Family history of prostate cancer   . Family history of pancreatic cancer   . Malignant neoplasm of lower-inner quadrant of right breast of female, estrogen receptor positive ([place]) 05/25/2019    has No Known Allergies.  MEDICAL HISTORY: Past Medical History:  Diagnosis Date  . Breast cancer ([place])   . Cancer [facility])    breast  . Family history of pancreatic cancer   . Family history of prostate cancer     SURGICAL HISTORY: Past Surgical History:  Procedure Laterality Date  . GASTRIC BYPASS    . HERNIA REPAIR    . PORTACATH PLACEMENT Right 06/09/2019   Procedure: INSERTION PORT-A-CATH WITH ULTRASOUND;  Surgeon: Rolm Bookbinder, MD;  Location: [facility];  Service: General;  Laterality: Right;    SOCIAL HISTORY: Social History   Socioeconomic History  . Marital status: Married    Spouse name: Not on file  . Number of children: Not on file  . Years of education: Not on file  . Highest education level: Not on file  Occupational History  . Not on file  Tobacco Use  . Smoking status: Former Research scientist (life sciences)  . Smokeless tobacco: Never Used  Substance and Sexual Activity  . Alcohol use: Never  . Drug use: Never  . Sexual activity: Not on file  Other Topics Concern  . Not on file  Social History Narrative  . Not on file   Social Determinants of Health   Financial Resource Strain:   . Difficulty of Paying Living Expenses:    Food Insecurity:   . Worried About Charity fundraiser in the Last Year:   . Arboriculturist in the Last Year:   Transportation Needs:   . Film/video editor (Medical):   Marland Kitchen Lack of Transportation (Non-Medical):   Physical Activity:   . Days of Exercise per Week:   . Minutes of Exercise per Session:   Stress:   . Feeling of Stress :   Social Connections:   . Frequency of Communication with Friends and Family:   . Frequency of Social Gatherings with Friends and Family:   . Attends Religious Services:   . Active Member of Clubs or Organizations:   . Attends Archivist Meetings:   Marland Kitchen Marital Status:   Intimate Partner Violence:   . Fear of Current or Ex-Partner:   . Emotionally Abused:   Marland Kitchen Physically Abused:   . Sexually Abused:     FAMILY HISTORY: Family History  Problem Relation Age of Onset  . Prostate cancer Father   . Pancreatic cancer Paternal Grandmother     Review of Systems  Constitutional: Positive for fatigue. Negative for appetite change, chills, fever and unexpected weight change.  HENT:   Negative for hearing loss and lump/mass.   Eyes: Negative for eye problems and  icterus.  Respiratory: Negative for chest tightness, cough and shortness of breath.   Cardiovascular: Negative for chest pain, leg swelling and palpitations.  Gastrointestinal: Negative for abdominal distention, abdominal pain, constipation, diarrhea, nausea and vomiting.  Endocrine: Positive for hot flashes.  Genitourinary: Negative for difficulty urinating.   Musculoskeletal: Negative for arthralgias.  Skin: Negative for itching and rash.  Neurological: Negative for dizziness, extremity weakness, headaches and numbness.  Hematological: Negative for adenopathy. Does not bruise/bleed easily.  Psychiatric/Behavioral: Negative for depression. The patient is not nervous/anxious.       PHYSICAL EXAMINATION  ECOG PERFORMANCE STATUS: 1 - Symptomatic but completely  ambulatory  Vitals:   10/28/19 0858  BP: 127/83  Pulse: 82  Resp: 20  Temp: 98 F (36.7 C)  SpO2: 100%    Physical Exam Constitutional:      General: She is not in acute distress.    Appearance: Normal appearance. She is not toxic-appearing.  HENT:     Head: Normocephalic and atraumatic.  Eyes:     General: No scleral icterus.    Pupils: Pupils are equal, round, and reactive to light.  Cardiovascular:     Rate and Rhythm: Normal rate and regular rhythm.     Pulses: Normal pulses.     Heart sounds: Normal heart sounds.  Pulmonary:     Effort: Pulmonary effort is normal. No respiratory distress.     Breath sounds: Normal breath sounds. No wheezing.  Abdominal:     General: Abdomen is flat. Bowel sounds are normal. There is no distension.     Palpations: Abdomen is soft.  Musculoskeletal:        General: No swelling.     Cervical back: Neck supple.  Lymphadenopathy:     Cervical: No cervical adenopathy.  Skin:    General: Skin is warm and dry.     Capillary Refill: Capillary refill takes less than 2 seconds.     Findings: No rash.  Neurological:     General: No focal deficit present.     Mental Status: She is alert.  Psychiatric:        Mood and Affect: Mood normal.        Behavior: Behavior normal.     LABORATORY DATA:  CBC    Component Value Date/Time   WBC 3.9 (L) 10/28/2019 0841   WBC 3.3 (L) 06/22/2019 1135   RBC 3.71 (L) 10/28/2019 0841   HGB 11.6 (L) 10/28/2019 0841   HCT 33.9 (L) 10/28/2019 0841   PLT 240 10/28/2019 0841   MCV 91.4 10/28/2019 0841   MCH 31.3 10/28/2019 0841   MCHC 34.2 10/28/2019 0841   RDW 13.1 10/28/2019 0841   LYMPHSABS 0.5 (L) 10/28/2019 0841   MONOABS 0.4 10/28/2019 0841   EOSABS 0.1 10/28/2019 0841   BASOSABS 0.0 10/28/2019 0841    CMP     Component Value Date/Time   NA 140 10/28/2019 0841   K 3.6 10/28/2019 0841   CL 107 10/28/2019 0841   CO2 23 10/28/2019 0841   GLUCOSE 94 10/28/2019 0841   BUN 9 10/28/2019  0841   CREATININE 0.64 10/28/2019 0841   CALCIUM 9.6 10/28/2019 0841   PROT 6.9 10/28/2019 0841   ALBUMIN 4.0 10/28/2019 0841   AST 29 10/28/2019 0841   ALT 32 10/28/2019 0841   ALKPHOS 63 10/28/2019 0841   BILITOT 0.6 10/28/2019 0841   GFRNONAA >60 10/28/2019 0841   GFRAA >60 10/28/2019 3570  ASSESSMENT and THERAPY PLAN:   Malignant neoplasm of lower-inner quadrant of right breast of female, estrogen receptor positive ([place]) 05/15/2019:Patient palpated a right breast lump for two years. Diagnostic mammogram showed a 4.5cm right breast mass at the 6 o'clock position involving the overlying skin, 2 benign appearing left breast mass at the 10 o'clock position measuring 2.4cm and 2.1cm, and no axillary adenopathy. 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%, HER-2 negative ratio 1.41 T2 N0 stage IIa clinical stage  Treatment plan: 1.Neoadjuvant chemotherapywith dose dense Adriamycin and Cytoxan x4 followed by Taxol x12starting 06/10/2019 2.Breast conserving surgery with sentinel lymph node biopsy 3.Adjuvant radiation 4.Follow-up adjuvant antiestrogen therapy(patient may be opting for oophorectomy plus anastrozole) ----------------------------------------------------------------------------------------------------------------------------------------------- Current treatment:Completed 4 cycles ofdose dense Adriamycin and Cytoxan, today cycle 12 Taxol Echocardiogram: 06/05/2019: EF 55 to 60% normal LV function  She has undergone her MRI and it showed a good response and reduction in her tumor size.  She is undergoing lumpectomy in early May with port and Nexplanon removal.  She will start Zoladex today for ovarian suppression until she can get oopherectomy scheduled.  I discussed risks and benefits with her in detail.  I will send in Latisse at her request.     RTC in 4 weeks for f/u with Dr. Lindi Adie and her next injection.    All questions were answered. The patient knows to call the clinic with any problems, questions or concerns. We can certainly see the patient much sooner if necessary.  Total encounter time: 30 minutes*  Wilber Bihari, NP 10/28/19 11:11 AM Medical Oncology and Hematology [place], [place] [address] Tel. [phone]    Fax. [phone]  *Total Encounter Time as defined by the Centers for Medicare and Medicaid Services includes, in addition to the face-to-face time of a patient visit (documented in the note above) non-face-to-face time: obtaining and reviewing outside history, ordering and reviewing medications, tests or procedures, care coordination (communications with other health care professionals or caregivers) and documentation in the medical record.

## 2019-10-28 NOTE — Assessment & Plan Note (Addendum)
05/15/2019:Patient palpated a right breast lump for two years. Diagnostic mammogram showed a 4.5cm right breast mass at the 6 o'clock position involving the overlying skin, 2 benign appearing left breast mass at the 10 o'clock position measuring 2.4cm and 2.1cm, and no axillary adenopathy. 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%, HER-2 negative ratio 1.41 T2 N0 stage IIa clinical stage  Treatment plan: 1.Neoadjuvant chemotherapywith dose dense Adriamycin and Cytoxan x4 followed by Taxol x12starting 06/10/2019 2.Breast conserving surgery with sentinel lymph node biopsy 3.Adjuvant radiation 4.Follow-up adjuvant antiestrogen therapy(patient may be opting for oophorectomy plus anastrozole) ----------------------------------------------------------------------------------------------------------------------------------------------- Current treatment:Completed 4 cycles ofdose dense Adriamycin and Cytoxan, today cycle 12 Taxol Echocardiogram: 06/05/2019: EF 55 to 60% normal LV function  She has undergone her MRI and it showed a good response and reduction in her tumor size.  She is undergoing lumpectomy in early May with port and Nexplanon removal.  She will start Zoladex today for ovarian suppression until she can get oopherectomy scheduled.  I discussed risks and benefits with her in detail.  I will send in Latisse at her request.    RTC in 4 weeks for f/u with Dr. Lindi Adie and her next injection.

## 2019-10-29 ENCOUNTER — Telehealth: Payer: Self-pay | Admitting: Adult Health

## 2019-10-29 NOTE — Telephone Encounter (Signed)
Scheduled appts per 4/21 los. Left voicemail with next appt details.

## 2019-11-02 ENCOUNTER — Other Ambulatory Visit: Payer: Self-pay | Admitting: General Surgery

## 2019-11-02 DIAGNOSIS — Z170 Estrogen receptor positive status [ER+]: Secondary | ICD-10-CM

## 2019-11-02 DIAGNOSIS — C50311 Malignant neoplasm of lower-inner quadrant of right female breast: Secondary | ICD-10-CM

## 2019-11-03 ENCOUNTER — Encounter: Payer: Self-pay | Admitting: *Deleted

## 2019-11-03 DIAGNOSIS — C50311 Malignant neoplasm of lower-inner quadrant of right female breast: Secondary | ICD-10-CM

## 2019-11-11 ENCOUNTER — Encounter: Payer: Self-pay | Admitting: *Deleted

## 2019-11-11 ENCOUNTER — Encounter (HOSPITAL_BASED_OUTPATIENT_CLINIC_OR_DEPARTMENT_OTHER): Payer: Self-pay | Admitting: General Surgery

## 2019-11-11 ENCOUNTER — Other Ambulatory Visit: Payer: Self-pay

## 2019-11-14 ENCOUNTER — Other Ambulatory Visit (HOSPITAL_COMMUNITY)
Admission: RE | Admit: 2019-11-14 | Discharge: 2019-11-14 | Disposition: A | Discharge status: Home / Self Care | Payer: 59 | Source: Ambulatory Visit | Attending: General Surgery | Admitting: General Surgery

## 2019-11-14 DIAGNOSIS — Z01812 Encounter for preprocedural laboratory examination: Secondary | ICD-10-CM | POA: Diagnosis not present

## 2019-11-14 DIAGNOSIS — Z20822 Contact with and (suspected) exposure to covid-19: Secondary | ICD-10-CM | POA: Insufficient documentation

## 2019-11-14 LAB — SARS CORONAVIRUS 2 (TAT 6-24 HRS): SARS Coronavirus 2: NEGATIVE

## 2019-11-17 ENCOUNTER — Ambulatory Visit
Admission: RE | Admit: 2019-11-17 | Discharge: 2019-11-17 | Disposition: A | Discharge status: Home / Self Care | Payer: 59 | Source: Ambulatory Visit | Attending: General Surgery | Admitting: General Surgery

## 2019-11-17 ENCOUNTER — Other Ambulatory Visit: Payer: Self-pay

## 2019-11-17 DIAGNOSIS — C50311 Malignant neoplasm of lower-inner quadrant of right female breast: Secondary | ICD-10-CM

## 2019-11-17 DIAGNOSIS — Z170 Estrogen receptor positive status [ER+]: Secondary | ICD-10-CM

## 2019-11-17 NOTE — Progress Notes (Signed)
Ensure Pre surgery drink provided to patient with instructions to complete by 9:30 am DOS, arrival time of 10:30 confirmed for 12:30 surgery.

## 2019-11-18 ENCOUNTER — Ambulatory Visit (HOSPITAL_COMMUNITY)
Admission: RE | Admit: 2019-11-18 | Discharge: 2019-11-18 | Disposition: A | Discharge status: Home / Self Care | Payer: 59 | Source: Ambulatory Visit | Attending: General Surgery | Admitting: General Surgery

## 2019-11-18 ENCOUNTER — Ambulatory Visit (HOSPITAL_BASED_OUTPATIENT_CLINIC_OR_DEPARTMENT_OTHER)
Admission: RE | Admit: 2019-11-18 | Discharge: 2019-11-18 | Disposition: A | Discharge status: Home / Self Care | Payer: 59 | Attending: General Surgery | Admitting: General Surgery

## 2019-11-18 ENCOUNTER — Other Ambulatory Visit: Payer: Self-pay

## 2019-11-18 ENCOUNTER — Ambulatory Visit (HOSPITAL_BASED_OUTPATIENT_CLINIC_OR_DEPARTMENT_OTHER): Payer: 59 | Admitting: Certified Registered"

## 2019-11-18 ENCOUNTER — Ambulatory Visit
Admission: RE | Admit: 2019-11-18 | Discharge: 2019-11-18 | Disposition: A | Discharge status: Home / Self Care | Payer: 59 | Source: Ambulatory Visit | Attending: General Surgery | Admitting: General Surgery

## 2019-11-18 ENCOUNTER — Encounter (HOSPITAL_BASED_OUTPATIENT_CLINIC_OR_DEPARTMENT_OTHER): Payer: Self-pay | Admitting: General Surgery

## 2019-11-18 ENCOUNTER — Encounter (HOSPITAL_BASED_OUTPATIENT_CLINIC_OR_DEPARTMENT_OTHER)
Admission: RE | Disposition: A | Discharge status: Home / Self Care | Payer: Self-pay | Source: Home / Self Care | Attending: General Surgery

## 2019-11-18 DIAGNOSIS — K219 Gastro-esophageal reflux disease without esophagitis: Secondary | ICD-10-CM | POA: Diagnosis not present

## 2019-11-18 DIAGNOSIS — C50311 Malignant neoplasm of lower-inner quadrant of right female breast: Secondary | ICD-10-CM

## 2019-11-18 DIAGNOSIS — Z9884 Bariatric surgery status: Secondary | ICD-10-CM | POA: Insufficient documentation

## 2019-11-18 DIAGNOSIS — Z87891 Personal history of nicotine dependence: Secondary | ICD-10-CM | POA: Insufficient documentation

## 2019-11-18 DIAGNOSIS — Z170 Estrogen receptor positive status [ER+]: Secondary | ICD-10-CM

## 2019-11-18 DIAGNOSIS — C50911 Malignant neoplasm of unspecified site of right female breast: Secondary | ICD-10-CM | POA: Diagnosis present

## 2019-11-18 HISTORY — PX: FOREIGN BODY REMOVAL: SHX962

## 2019-11-18 HISTORY — DX: Gastro-esophageal reflux disease without esophagitis: K21.9

## 2019-11-18 HISTORY — PX: PORT-A-CATH REMOVAL: SHX5289

## 2019-11-18 HISTORY — PX: BREAST LUMPECTOMY WITH RADIOACTIVE SEED AND SENTINEL LYMPH NODE BIOPSY: SHX6550

## 2019-11-18 LAB — POCT PREGNANCY, URINE: Preg Test, Ur: NEGATIVE

## 2019-11-18 SURGERY — BREAST LUMPECTOMY WITH RADIOACTIVE SEED AND SENTINEL LYMPH NODE BIOPSY
Anesthesia: General | Site: Chest | Laterality: Right

## 2019-11-18 MED ORDER — PHENYLEPHRINE 40 MCG/ML (10ML) SYRINGE FOR IV PUSH (FOR BLOOD PRESSURE SUPPORT)
PREFILLED_SYRINGE | INTRAVENOUS | Status: AC
  Filled 2019-11-18: qty 10

## 2019-11-18 MED ORDER — FENTANYL CITRATE (PF) 100 MCG/2ML IJ SOLN
INTRAMUSCULAR | Status: AC
  Filled 2019-11-18: qty 2

## 2019-11-18 MED ORDER — OXYCODONE HCL 5 MG/5ML PO SOLN: 5.0000 mg | Freq: Once | ORAL | Status: AC | PRN

## 2019-11-18 MED ORDER — KETOROLAC TROMETHAMINE 15 MG/ML IJ SOLN
15.0000 mg | INTRAMUSCULAR | Status: AC
  Administered 2019-11-18: 11:00:00 15 mg via INTRAVENOUS

## 2019-11-18 MED ORDER — LIDOCAINE 2% (20 MG/ML) 5 ML SYRINGE
INTRAMUSCULAR | Status: AC
  Filled 2019-11-18: qty 5

## 2019-11-18 MED ORDER — LACTATED RINGERS IV SOLN: INTRAVENOUS | Status: DC

## 2019-11-18 MED ORDER — OXYCODONE HCL 5 MG PO TABS
5.0000 mg | ORAL_TABLET | Freq: Once | ORAL | Status: AC | PRN
  Administered 2019-11-18: 5 mg via ORAL

## 2019-11-18 MED ORDER — OXYCODONE HCL 5 MG PO TABS: 5.0000 mg | ORAL_TABLET | Freq: Four times a day (QID) | ORAL | 0 refills | Status: DC | PRN

## 2019-11-18 MED ORDER — MIDAZOLAM HCL 2 MG/2ML IJ SOLN
1.0000 mg | INTRAMUSCULAR | Status: DC | PRN
  Administered 2019-11-18: 12:00:00 2 mg via INTRAVENOUS

## 2019-11-18 MED ORDER — FENTANYL CITRATE (PF) 100 MCG/2ML IJ SOLN
INTRAMUSCULAR | Status: DC | PRN
  Administered 2019-11-18 (×3): 25 ug via INTRAVENOUS

## 2019-11-18 MED ORDER — DEXAMETHASONE SODIUM PHOSPHATE 10 MG/ML IJ SOLN
INTRAMUSCULAR | Status: DC | PRN
  Administered 2019-11-18: 4 mg via INTRAVENOUS

## 2019-11-18 MED ORDER — FENTANYL CITRATE (PF) 100 MCG/2ML IJ SOLN
100.0000 ug | Freq: Once | INTRAMUSCULAR | Status: AC
  Administered 2019-11-18: 12:00:00 100 ug via INTRAVENOUS

## 2019-11-18 MED ORDER — PROPOFOL 10 MG/ML IV BOLUS
INTRAVENOUS | Status: DC | PRN
  Administered 2019-11-18: 160 mg via INTRAVENOUS

## 2019-11-18 MED ORDER — ACETAMINOPHEN 500 MG PO TABS
ORAL_TABLET | ORAL | Status: AC
  Filled 2019-11-18: qty 2

## 2019-11-18 MED ORDER — ONDANSETRON HCL 4 MG/2ML IJ SOLN: 4.0000 mg | Freq: Once | INTRAMUSCULAR | Status: DC | PRN

## 2019-11-18 MED ORDER — GABAPENTIN 100 MG PO CAPS
100.0000 mg | ORAL_CAPSULE | ORAL | Status: AC
  Administered 2019-11-18: 11:00:00 100 mg via ORAL

## 2019-11-18 MED ORDER — CEFAZOLIN SODIUM-DEXTROSE 2-4 GM/100ML-% IV SOLN
2.0000 g | INTRAVENOUS | Status: AC
  Administered 2019-11-18: 2 g via INTRAVENOUS

## 2019-11-18 MED ORDER — MIDAZOLAM HCL 2 MG/2ML IJ SOLN
INTRAMUSCULAR | Status: AC
  Filled 2019-11-18: qty 2

## 2019-11-18 MED ORDER — PROPOFOL 10 MG/ML IV BOLUS
INTRAVENOUS | Status: AC
  Filled 2019-11-18: qty 20

## 2019-11-18 MED ORDER — ARTIFICIAL TEARS OPHTHALMIC OINT
TOPICAL_OINTMENT | OPHTHALMIC | Status: AC
  Filled 2019-11-18: qty 10.5

## 2019-11-18 MED ORDER — OXYCODONE HCL 5 MG PO TABS
ORAL_TABLET | ORAL | Status: AC
  Filled 2019-11-18: qty 1

## 2019-11-18 MED ORDER — ACETAMINOPHEN 500 MG PO TABS
1000.0000 mg | ORAL_TABLET | ORAL | Status: AC
  Administered 2019-11-18: 11:00:00 1000 mg via ORAL

## 2019-11-18 MED ORDER — TECHNETIUM TC 99M SULFUR COLLOID FILTERED
1.0000 | Freq: Once | INTRAVENOUS | Status: AC | PRN
  Administered 2019-11-18: 1 via INTRADERMAL

## 2019-11-18 MED ORDER — LIDOCAINE HCL (CARDIAC) PF 100 MG/5ML IV SOSY
PREFILLED_SYRINGE | INTRAVENOUS | Status: DC | PRN
  Administered 2019-11-18: 40 mg via INTRAVENOUS

## 2019-11-18 MED ORDER — PHENYLEPHRINE HCL (PRESSORS) 10 MG/ML IV SOLN
INTRAVENOUS | Status: DC | PRN
  Administered 2019-11-18 (×2): 40 ug via INTRAVENOUS

## 2019-11-18 MED ORDER — ENSURE PRE-SURGERY PO LIQD: 296.0000 mL | Freq: Once | ORAL | Status: DC

## 2019-11-18 MED ORDER — CEFAZOLIN SODIUM-DEXTROSE 2-4 GM/100ML-% IV SOLN
INTRAVENOUS | Status: AC
  Filled 2019-11-18: qty 100

## 2019-11-18 MED ORDER — KETOROLAC TROMETHAMINE 15 MG/ML IJ SOLN
INTRAMUSCULAR | Status: AC
  Filled 2019-11-18: qty 1

## 2019-11-18 MED ORDER — ONDANSETRON HCL 4 MG/2ML IJ SOLN
INTRAMUSCULAR | Status: DC | PRN
  Administered 2019-11-18: 4 mg via INTRAVENOUS

## 2019-11-18 MED ORDER — SODIUM CHLORIDE (PF) 0.9 % IJ SOLN
INTRAVENOUS | Status: DC | PRN
  Administered 2019-11-18: 3.5 mL via INTRADERMAL

## 2019-11-18 MED ORDER — GABAPENTIN 100 MG PO CAPS
ORAL_CAPSULE | ORAL | Status: AC
  Filled 2019-11-18: qty 1

## 2019-11-18 MED ORDER — FENTANYL CITRATE (PF) 100 MCG/2ML IJ SOLN: 25.0000 ug | INTRAMUSCULAR | Status: DC | PRN

## 2019-11-18 MED ORDER — BUPIVACAINE HCL (PF) 0.25 % IJ SOLN
INTRAMUSCULAR | Status: DC | PRN
  Administered 2019-11-18: 9 mL

## 2019-11-18 SURGICAL SUPPLY — 62 items
APPLIER CLIP 9.375 MED OPEN (MISCELLANEOUS) ×4
BINDER BREAST LRG (GAUZE/BANDAGES/DRESSINGS) ×4 IMPLANT
BINDER BREAST MEDIUM (GAUZE/BANDAGES/DRESSINGS) IMPLANT
BINDER BREAST XLRG (GAUZE/BANDAGES/DRESSINGS) IMPLANT
BINDER BREAST XXLRG (GAUZE/BANDAGES/DRESSINGS) IMPLANT
BLADE CLIPPER SURG (BLADE) IMPLANT
BLADE SURG 15 STRL LF DISP TIS (BLADE) ×6 IMPLANT
BLADE SURG 15 STRL SS (BLADE) ×2
CANISTER SUC SOCK COL 7IN (MISCELLANEOUS) IMPLANT
CANISTER SUCT 1200ML W/VALVE (MISCELLANEOUS) ×4 IMPLANT
CHLORAPREP W/TINT 26 (MISCELLANEOUS) ×8 IMPLANT
CLIP APPLIE 9.375 MED OPEN (MISCELLANEOUS) ×3 IMPLANT
CLIP VESOCCLUDE SM WIDE 6/CT (CLIP) ×4 IMPLANT
COVER BACK TABLE 60X90IN (DRAPES) ×4 IMPLANT
COVER MAYO STAND STRL (DRAPES) ×4 IMPLANT
COVER PROBE W GEL 5X96 (DRAPES) IMPLANT
COVER WAND RF STERILE (DRAPES) IMPLANT
DECANTER SPIKE VIAL GLASS SM (MISCELLANEOUS) IMPLANT
DERMABOND ADVANCED (GAUZE/BANDAGES/DRESSINGS) ×2
DERMABOND ADVANCED .7 DNX12 (GAUZE/BANDAGES/DRESSINGS) ×6 IMPLANT
DRAPE LAPAROSCOPIC ABDOMINAL (DRAPES) ×4 IMPLANT
DRAPE LAPAROTOMY 100X72 PEDS (DRAPES) IMPLANT
DRAPE UTILITY XL STRL (DRAPES) ×4 IMPLANT
DRSG TEGADERM 4X4.75 (GAUZE/BANDAGES/DRESSINGS) IMPLANT
ELECT COATED BLADE 2.86 ST (ELECTRODE) ×4 IMPLANT
ELECT REM PT RETURN 9FT ADLT (ELECTROSURGICAL) ×4
ELECTRODE REM PT RTRN 9FT ADLT (ELECTROSURGICAL) ×3 IMPLANT
GAUZE SPONGE 4X4 12PLY STRL LF (GAUZE/BANDAGES/DRESSINGS) IMPLANT
GLOVE BIO SURGEON STRL SZ7 (GLOVE) ×8 IMPLANT
GLOVE BIOGEL PI IND STRL 7.5 (GLOVE) ×3 IMPLANT
GLOVE BIOGEL PI INDICATOR 7.5 (GLOVE) ×1
GOWN STRL REUS W/ TWL LRG LVL3 (GOWN DISPOSABLE) ×9 IMPLANT
GOWN STRL REUS W/TWL LRG LVL3 (GOWN DISPOSABLE) ×3
HEMOSTAT ARISTA ABSORB 3G PWDR (HEMOSTASIS) IMPLANT
KIT MARKER MARGIN INK (KITS) ×4 IMPLANT
NDL SAFETY ECLIPSE 18X1.5 (NEEDLE) ×3 IMPLANT
NEEDLE HYPO 18GX1.5 SHARP (NEEDLE) ×1
NEEDLE HYPO 25X1 1.5 SAFETY (NEEDLE) ×8 IMPLANT
NS IRRIG 1000ML POUR BTL (IV SOLUTION) ×4 IMPLANT
PENCIL SMOKE EVACUATOR (MISCELLANEOUS) ×4 IMPLANT
RETRACTOR ONETRAX LX 90X20 (MISCELLANEOUS) ×4 IMPLANT
SET BASIN DAY SURGERY F.S. (CUSTOM PROCEDURE TRAY) ×4 IMPLANT
SLEEVE SCD COMPRESS KNEE MED (MISCELLANEOUS) ×4 IMPLANT
SPONGE LAP 4X18 RFD (DISPOSABLE) ×12 IMPLANT
STRIP CLOSURE SKIN 1/2X4 (GAUZE/BANDAGES/DRESSINGS) ×4 IMPLANT
SUT ETHILON 2 0 FS 18 (SUTURE) IMPLANT
SUT MNCRL AB 4-0 PS2 18 (SUTURE) ×8 IMPLANT
SUT MON AB 5-0 PS2 18 (SUTURE) IMPLANT
SUT SILK 2 0 SH (SUTURE) ×4 IMPLANT
SUT VIC AB 2-0 SH 27 (SUTURE) ×3
SUT VIC AB 2-0 SH 27XBRD (SUTURE) ×9 IMPLANT
SUT VIC AB 3-0 SH 27 (SUTURE) ×2
SUT VIC AB 3-0 SH 27X BRD (SUTURE) ×6 IMPLANT
SUT VIC AB 5-0 PS2 18 (SUTURE) IMPLANT
SUT VICRYL 3-0 CR8 SH (SUTURE) IMPLANT
SUT VICRYL 4-0 PS2 18IN ABS (SUTURE) IMPLANT
SYR CONTROL 10ML LL (SYRINGE) ×8 IMPLANT
TOWEL GREEN STERILE FF (TOWEL DISPOSABLE) ×4 IMPLANT
TRAY FAXITRON CT DISP (TRAY / TRAY PROCEDURE) ×4 IMPLANT
TUBE CONNECTING 20X1/4 (TUBING) ×4 IMPLANT
UNDERPAD 30X36 HEAVY ABSORB (UNDERPADS AND DIAPERS) IMPLANT
YANKAUER SUCT BULB TIP NO VENT (SUCTIONS) ×4 IMPLANT

## 2019-11-18 NOTE — Discharge Instructions (Signed)
[facility] Office Phone Number [phone]  BREAST BIOPSY/ PARTIAL MASTECTOMY: POST OP INSTRUCTIONS Take 400 mg of ibuprofen every 8 hours or 650 mg tylenol every 6 hours for next 72 hours then as needed. Use ice several times daily also. Always review your discharge instruction sheet given to you by the facility where your surgery was performed.  IF YOU HAVE DISABILITY OR FAMILY LEAVE FORMS, YOU MUST BRING THEM TO THE OFFICE FOR PROCESSING.  DO NOT GIVE THEM TO YOUR DOCTOR.  1. A prescription for pain medication may be given to you upon discharge.  Take your pain medication as prescribed, if needed.  If narcotic pain medicine is not needed, then you may take acetaminophen (Tylenol), naprosyn (Alleve) or ibuprofen (Advil) as needed. No Tylenol or Ibuprofen until 5:30pm if needed. 2. Take your usually prescribed medications unless otherwise directed 3. If you need a refill on your pain medication, please contact your pharmacy.  They will contact our office to request authorization.  Prescriptions will not be filled after 5pm or on week-ends. 4. You should eat very light the first 24 hours after surgery, such as soup, crackers, pudding, etc.  Resume your normal diet the day after surgery. 5. Most patients will experience some swelling and bruising in the breast.  Ice packs and a good support bra will help.  Wear the breast binder provided or a sports bra for 72 hours day and night.  After that wear a sports bra during the day until you return to the office. Swelling and bruising can take several days to resolve.  6. It is common to experience some constipation if taking pain medication after surgery.  Increasing fluid intake and taking a stool softener will usually help or prevent this problem from occurring.  A mild laxative (Milk of Magnesia or Miralax) should be taken according to package directions if there are no bowel movements after 48 hours. 7. Unless discharge instructions  indicate otherwise, you may remove your bandages 48 hours after surgery and you may shower at that time.  You may have steri-strips (small skin tapes) in place directly over the incision.  These strips should be left on the skin for 7-10 days and will come off on their own.  If your surgeon used skin glue on the incision, you may shower in 24 hours.  The glue will flake off over the next 2-3 weeks.  Any sutures or staples will be removed at the office during your follow-up visit. 8. ACTIVITIES:  You may resume regular daily activities (gradually increasing) beginning the next day.  Wearing a good support bra or sports bra minimizes pain and swelling.  You may have sexual intercourse when it is comfortable. a. You may drive when you no longer are taking prescription pain medication, you can comfortably wear a seatbelt, and you can safely maneuver your car and apply brakes. b. RETURN TO WORK:  ______________________________________________________________________________________ 9. You should see your doctor in the office for a follow-up appointment approximately two weeks after your surgery.  Your doctor's nurse will typically make your follow-up appointment when she calls you with your pathology report.  Expect your pathology report 3-4 business days after your surgery.  You may call to check if you do not hear from [place] after three days. 10. OTHER INSTRUCTIONS: _______________________________________________________________________________________________ _____________________________________________________________________________________________________________________________________ _____________________________________________________________________________________________________________________________________ _____________________________________________________________________________________________________________________________________  WHEN TO CALL DR WAKEFIELD: 1. Fever over 101.0 2. Nausea  and/or vomiting. 3. Extreme swelling or bruising. 4. Continued bleeding from incision. 5. Increased pain, redness,  or drainage from the incision.  The clinic staff is available to answer your questions during regular business hours.  Please don't hesitate to call and ask to speak to one of the nurses for clinical concerns.  If you have a medical emergency, go to the nearest emergency room or call 911.  A surgeon from [facility] is always on call at the hospital.  For further questions, please visit [email or web address]   Post Anesthesia Home Care Instructions  Activity: Get plenty of rest for the remainder of the day. A responsible individual must stay with you for 24 hours following the procedure.  For the next 24 hours, DO NOT: -Drive a car -Paediatric nurse -Drink alcoholic beverages -Take any medication unless instructed by your physician -Make any legal decisions or sign important papers.  Meals: Start with liquid foods such as gelatin or soup. Progress to regular foods as tolerated. Avoid greasy, spicy, heavy foods. If nausea and/or vomiting occur, drink only clear liquids until the nausea and/or vomiting subsides. Call your physician if vomiting continues.  Special Instructions/Symptoms: Your throat may feel dry or sore from the anesthesia or the breathing tube placed in your throat during surgery. If this causes discomfort, gargle with warm salt water. The discomfort should disappear within 24 hours.  If you had a scopolamine patch placed behind your ear for the management of post- operative nausea and/or vomiting:  1. The medication in the patch is effective for 72 hours, after which it should be removed.  Wrap patch in a tissue and discard in the trash. Wash hands thoroughly with soap and water. 2. You may remove the patch earlier than 72 hours if you experience unpleasant side effects which may include dry mouth, dizziness or visual disturbances. 3.  Avoid touching the patch. Wash your hands with soap and water after contact with the patch.

## 2019-11-18 NOTE — Transfer of Care (Signed)
Immediate Anesthesia Transfer of Care Note  Patient: [name]  Procedure(s) Performed: RIGHT BREAST SEED GUIDED LUMPECTOMY, RIGHT AXILLARY SENTINEL LYMPH NODE BIOSPY (Right Breast) REMOVAL PORT-A-CATH (N/A Chest) NEXPLANON REMOVAL (N/A Arm Upper)  Patient Location: PACU  Anesthesia Type:GA combined with regional for post-op pain  Level of Consciousness: drowsy  Airway & Oxygen Therapy: Patient Spontanous Breathing and Patient connected to face mask oxygen  Post-op Assessment: Report given to RN and Post -op Vital signs reviewed and stable  Post vital signs: Reviewed and stable  Last Vitals:  Vitals Value Taken Time  BP 89/55 11/18/19 1358  Temp    Pulse 68 11/18/19 1359  Resp 15 11/18/19 1400  SpO2 100 % 11/18/19 1359  Vitals shown include unvalidated device data.  Last Pain:  Vitals:   11/18/19 1354  TempSrc:   PainSc: (P) Asleep         Complications: No apparent anesthesia complications

## 2019-11-18 NOTE — Op Note (Signed)
Preoperative diagnosis: right breast cancer s/p primary chemotherapy Postoperative diagnosis: saa Procedure: 1. Right breast seed guided lumpectomy 2. Port removal 3. Right deep axillary sentinel node biopsy 4. nexplanon removal 5. Injection blue dye for sentinel node idenftication Surgeon: Dr Serita Grammes Asst: Carlena Hurl, PA-C EBL: 25 cc Specimens: 1. Right breast tissue marked with paint containing seed and clip 2. Additional right breast inferior, posterior and medial margins marked long lateral double deep short superior Complications none Drains none Sponge and needle count correct dispo recovery stable.  Indications: 49 yof referred by Dr Lindi Adie for right breast cancer I saw several months ago she has known right breast mass times two years and new left breast mass there as well. she recently has had lap gastric bypass in [place] just prior to this. she had a liver mass noted on laparoscopy that has since been evaluated and is negative. she then was evaluated with dx mm that shows a medial inferior right breast mass. on [place] there is a 4.5x3.4x2.3 cm mass, the axilla is negative on [place]. biopsy of the left sided masses are FA. biopsy of the right side is idc with dcis with lvi, grade III, er/pr pos, her 2 negative, Ki is 40%. initial mri showed a 3.6 cm breast mass and no other abnormalities. she has undergone AC-T which she has done well with. she finishes tomorrow. she has repeat mri that shows interval decrease to 1.9 cm with no other abnormalities.  genetics are negative. We discussed options and she would like to proceed with lumpectomy, sn biopsy, port removal and she would like nexplanon out as well.  Procedure: After informed consent was obtained the patient first had a radioactive seed placed in the breast.  I had these images available for my review in the operating room.  She underwent a pectoral block.  She was injected with technetium in the standard periareolar  fashion.  She was then placed under general anesthesia without complication.  She was prepped and draped on the right side in the standard sterile surgical fashion.  A surgical timeout was then performed.  I first injected a mixture of methylene blue dye and saline in the retroareolar position and massaged this.  This was done for later sentinel lymph node identification after chemotherapy.  The radioactive seed and the tumor were in the lower pole.  This was very close to the skin.  Due to her recent gastric bypass she is also lost a fair amount of weight.  I then elected to make an elliptical incision overlying the seed as I thought the skin might be involved.  I then created flaps superiorly inferiorly and then remove the seed with the surrounding tissue in an effort to get a clear margin.  I then passed this off the table after marking it with paint.  Mammogram showed I remove the clip and the seed.  The 3D imaging showed that it looks like all my margins should be clear.  I did remove some additional margins as above as I thought the seed was closer to these margins.  I marked all these with sutures.  I then obtained hemostasis.  Clips were placed around the cavity.  I mobilized the breast tissue off the pectoralis muscle in an effort to close it.  I closed the breast tissue with 2-0 Vicryl to close down the space.  Then closed the skin with 3-0 Vicryl and 4-0 Monocryl.  Glue was eventually placed.  Identified the sentinel node in the  low axilla.  I infiltrated Marcaine and made incision below the axillary hairline.  I carried this to the axillary fascia.  I then identified a sentinel node with a count of 2498.  I remove this and passed off the off the table.  This node was also blue.  There were several other small nodes that were present that I removed as well.  I then obtained hemostasis.  I then closed the axillary fascia with 2-0 Vicryl.  The remainder of the incision closed with 3-0 Vicryl for  Monocryl.  Glue and Steri-Strips were placed.  I infiltrated Marcaine overlying her port on the right side.  I then reentered the old incision.  The port and the line were then removed in their entirety.  All the foreign body was also removed.  Hemostasis was observed.  I closed this with 3-0 Vicryl and 4-0 Monocryl.  I then prepped and draped the left upper extremity.  Identified the location of the Nexplanon device.  I made a small incision overlying the end of this.  I grasped this with a snap and removed it without difficulty.  This was closed with a 4-0 Monocryl and glue.  She tolerated all of this well was extubated and transferred to recovery stable.

## 2019-11-18 NOTE — H&P (Signed)
Kimberly Mueller is an 34 y.o. female.   Chief Complaint: breast cancer HPI: 72 yof referred by Dr Lindi Adie for right breast cancer I saw several months ago she has known right breast mass times two years and new left breast mass there as well. she recently has had lap gastric bypass in [place] just prior to this. she had a liver mass noted on laparoscopy that has since been evaluated and is negative. she then was evaluated with dx mm that shows a medial inferior right breast mass. on [place] there is a 4.5x3.4x2.3 cm mass, the axilla is negative on [place]. biopsy of the left sided masses are FA. biopsy of the right side is idc with dcis with lvi, grade III, er/pr pos, her 2 negative, Ki is 40%. initial mri showed a 3.6 cm breast mass and no other abnormalities. she has undergone AC-T which she has done well with. she finishes tomorrow. she has repeat mri that shows interval decrease to 1.9 cm with no other abnormalities. she is here with her mom to discuss surgery genetics are negative   Past Medical History:  Diagnosis Date  . Breast cancer ([place])   . Cancer [facility])    breast  . Family history of pancreatic cancer   . Family history of prostate cancer   . GERD (gastroesophageal reflux disease)     Past Surgical History:  Procedure Laterality Date  . GASTRIC BYPASS    . HERNIA REPAIR    . PORTACATH PLACEMENT Right 06/09/2019   Procedure: INSERTION PORT-A-CATH WITH ULTRASOUND;  Surgeon: Rolm Bookbinder, MD;  Location: [facility] [facility];  Service: General;  Laterality: Right;    Family History  Problem Relation Age of Onset  . Prostate cancer Father   . Pancreatic cancer Paternal Grandmother    Social History:  reports that she has quit smoking. She has never used smokeless tobacco. She reports that she does not drink alcohol or use drugs.  Allergies: No Known Allergies  No medications prior to admission.    No results found for this or any previous visit (from the past 48  hour(s)). MM RT RADIOACTIVE SEED LOC MAMMO GUIDE  Result Date: 11/17/2019 CLINICAL DATA:  Localization prior to surgery EXAM: MAMMOGRAPHIC GUIDED RADIOACTIVE SEED LOCALIZATION OF THE RIGHT BREAST COMPARISON:  Previous exam(s). FINDINGS: Patient presents for radioactive seed localization prior to surgery. I met with the patient and we discussed the procedure of seed localization including benefits and alternatives. We discussed the high likelihood of a successful procedure. We discussed the risks of the procedure including infection, bleeding, tissue injury and further surgery. We discussed the low dose of radioactivity involved in the procedure. Informed, written consent was given. The usual time-out protocol was performed immediately prior to the procedure. Using mammographic guidance, sterile technique, 1% lidocaine and an I-125 radioactive seed, the patient's known malignancy was localized using a medial approach. The follow-up mammogram images confirm the seed in the expected location and were marked for Dr. Donne Hazel. Follow-up survey of the patient confirms presence of the radioactive seed. Order number of I-125 seed:  [id number]. Total activity:  6.503 millicuries reference Date: November 04, 2019 The patient tolerated the procedure well and was released from the [facility]. She was given instructions regarding seed removal. IMPRESSION: Radioactive seed localization right breast. No apparent complications. Electronically Signed   By: Dorise Bullion III M.D   On: 11/17/2019 15:24    Review of Systems  All other systems reviewed and are negative.  Height '5\' 3"'  (1.6 m), weight 58.1 kg, last menstrual period 06/09/2019. Physical Exam  Physical Exam Rolm Bookbinder MD; 10/27/2019 3:51 PM) General Mental Status-Alert. Orientation-Oriented X3. Breast Nipples-No Discharge. Note: vague lower inner quadrant right breast mass port in place Lymphatic Head & Neck General Head & Neck  Lymphatics: Bilateral - Description - Normal. Axillary General Axillary Region: Bilateral - Description - Normal. Note: no [place] adenopathy cv rrr Lungs clear  Assessment/Plan BREAST CANCER OF LOWER-INNER QUADRANT OF RIGHT FEMALE BREAST (C50.311) Story: Port removal (OK with oncology) LUE nexplanon removal, right breast seed guided lumpectomy, right ax sn biopsy We discussed a sentinel lymph node biopsy as she does not appear to having lymph node involvement right now. We discussed the performance of that with injection of radioactive tracer. We discussed that there is a chance of having a positive node with a sentinel lymph node biopsy and we will await the permanent pathology to make any other first further decisions in terms of her treatment. We discussed up to a 5% risk lifetime of chronic shoulder pain as well as lymphedema associated with a sentinel lymph node biopsy. We discussed the options for treatment of the breast cancer which included lumpectomy versus a mastectomy. We discussed the performance of the lumpectomy with radioactive seed placement. We discussed a 5-10% chance of a positive margin requiring reexcision in the operating room. We also discussed that she will likely need radiation therapy if she undergoes lumpectomy. The breast cannot undergo more radiation therapy in the same breast after lumpectomy in the future. We discussed mastectomy and the postoperative care for that as well. Mastectomy can be followed by reconstruction. The decision for lumpectomy vs mastectomy has no impact on decision for chemotherapy. Most mastectomy patients will not need radiation therapy. We discussed that there is no difference in her survival whether she undergoes lumpectomy with radiation therapy or antiestrogen therapy versus a mastectomy. There is also no real difference between her recurrence in the breast. We discussed the risks of operation including bleeding, infection, possible  reoperation.  Rolm Bookbinder, MD 11/18/2019, 7:26 AM

## 2019-11-18 NOTE — Anesthesia Preprocedure Evaluation (Signed)
Anesthesia Evaluation  Patient identified by MRN, date of birth, ID band Patient awake    Reviewed: Allergy & Precautions, NPO status , Patient's Chart, lab work & pertinent test results  Airway Mallampati: II  TM Distance: >3 FB Neck ROM: Full    Dental  (+) Teeth Intact, Dental Advisory Given   Pulmonary former smoker,    breath sounds clear to auscultation       Cardiovascular  Rhythm:Regular Rate:Normal     Neuro/Psych    GI/Hepatic   Endo/Other    Renal/GU      Musculoskeletal   Abdominal   Peds  Hematology   Anesthesia Other Findings   Reproductive/Obstetrics                             Anesthesia Physical Anesthesia Plan  ASA: II  Anesthesia Plan: General   Post-op Pain Management:  Regional for Post-op pain   Induction: Intravenous  PONV Risk Score and Plan:   Airway Management Planned: LMA  Additional Equipment:   Intra-op Plan:   Post-operative Plan:   Informed Consent: I have reviewed the patients History and Physical, chart, labs and discussed the procedure including the risks, benefits and alternatives for the proposed anesthesia with the patient or authorized representative who has indicated his/her understanding and acceptance.     Dental advisory given  Plan Discussed with: CRNA and Anesthesiologist  Anesthesia Plan Comments:         Anesthesia Quick Evaluation

## 2019-11-18 NOTE — Anesthesia Postprocedure Evaluation (Signed)
Anesthesia Post Note  Patient: Kimberly Mueller  Procedure(s) Performed: RIGHT BREAST SEED GUIDED LUMPECTOMY, RIGHT AXILLARY SENTINEL LYMPH NODE BIOSPY (Right Breast) REMOVAL PORT-A-CATH (N/A Chest) NEXPLANON REMOVAL (N/A Arm Upper)     Patient location during evaluation: PACU Anesthesia Type: General and Regional Level of consciousness: awake and alert Pain management: pain level controlled Vital Signs Assessment: post-procedure vital signs reviewed and stable Respiratory status: spontaneous breathing, nonlabored ventilation, respiratory function stable and patient connected to nasal cannula oxygen Cardiovascular status: blood pressure returned to baseline and stable Postop Assessment: no apparent nausea or vomiting Anesthetic complications: no    Last Vitals:  Vitals:   11/18/19 1415 11/18/19 1430  BP: 100/64 100/70  Pulse: 72 69  Resp: 15 14  Temp:    SpO2: 100% 100%    Last Pain:  Vitals:   11/18/19 1455  TempSrc:   PainSc: 3                  Shawnay Bramel COKER

## 2019-11-18 NOTE — Anesthesia Procedure Notes (Signed)
Anesthesia Regional Block: Pectoralis block   Pre-Anesthetic Checklist: ,, timeout performed, Correct Patient, Correct Site, Correct Laterality, Correct Procedure, Correct Position, site marked, Risks and benefits discussed,  Surgical consent,  Pre-op evaluation,  At surgeon's request and post-op pain management  Laterality: Right  Prep: chloraprep       Needles:  Injection technique: Single-shot  Needle Type: Echogenic Needle     Needle Length: 9cm  Needle Gauge: 21     Additional Needles:   Narrative:  Start time: 11/18/2019 11:40 AM End time: 11/18/2019 11:50 AM Injection made incrementally with aspirations every 5 mL.  Performed by: Personally  Anesthesiologist: Roberts Gaudy, MD  Additional Notes: 30 cc 0.25% Bupivacaine with 1:200 epi injected easily

## 2019-11-18 NOTE — Interval H&P Note (Signed)
History and Physical Interval Note:  11/18/2019 12:10 PM  Kimberly Mueller  has presented today for surgery, with the diagnosis of RIGHT BREAST CANCER.  The various methods of treatment have been discussed with the patient and family. After consideration of risks, benefits and other options for treatment, the patient has consented to  Procedure(s) with comments: RIGHT BREAST SEED GUIDED LUMPECTOMY, RIGHT AXILLARY SENTINEL LYMPH NODE BIOSPY (Right) - GENERAL AND PECTORAL BLOCK REMOVAL PORT-A-CATH (N/A) - Gap (N/A) - GENERAL AND PECTORAL BLOCK as a surgical intervention.  The patient's history has been reviewed, patient examined, no change in status, stable for surgery.  I have reviewed the patient's chart and labs.  Questions were answered to the patient's satisfaction.     Rolm Bookbinder

## 2019-11-18 NOTE — Progress Notes (Signed)
Nuc med injections completed. Patient tolerated well.  Emotional support provided.

## 2019-11-18 NOTE — Anesthesia Procedure Notes (Signed)
Procedure Name: LMA Insertion Date/Time: 11/18/2019 12:39 PM Performed by: Lavonia Dana, CRNA Pre-anesthesia Checklist: Patient identified, Emergency Drugs available, Suction available and Patient being monitored Patient Re-evaluated:Patient Re-evaluated prior to induction Oxygen Delivery Method: Circle system utilized Preoxygenation: Pre-oxygenation with 100% oxygen Induction Type: IV induction Ventilation: Mask ventilation without difficulty LMA: LMA inserted LMA Size: 4.0 Number of attempts: 1 Airway Equipment and Method: Bite block Placement Confirmation: positive ETCO2 Tube secured with: Tape Dental Injury: Teeth and Oropharynx as per pre-operative assessment

## 2019-11-18 NOTE — Progress Notes (Signed)
Assisted Dr. Joslin with right, ultrasound guided, pectoralis block. Side rails up, monitors on throughout procedure. See vital signs in flow sheet. Tolerated Procedure well. 

## 2019-11-19 ENCOUNTER — Encounter: Payer: Self-pay | Admitting: *Deleted

## 2019-11-23 ENCOUNTER — Encounter: Payer: Self-pay | Admitting: *Deleted

## 2019-11-24 ENCOUNTER — Ambulatory Visit (INDEPENDENT_AMBULATORY_CARE_PROVIDER_SITE_OTHER): Payer: 59 | Admitting: Otolaryngology

## 2019-11-24 NOTE — Progress Notes (Signed)
Patient Care Team: Jilda Panda, MD as PCP - General (Internal Medicine) Nicholas Lose, MD as Consulting Physician (Hematology and Oncology) Rolm Bookbinder, MD as Consulting Physician (General Surgery) Gery Pray, MD as Consulting Physician (Radiation Oncology) Rockwell Germany, RN as Oncology Nurse Navigator Mauro Kaufmann, RN as Oncology Nurse Navigator  DIAGNOSIS:    ICD-10-CM   1. Malignant neoplasm of lower-inner quadrant of right breast of female, estrogen receptor positive ([place])  C50.311    Z17.0     SUMMARY OF ONCOLOGIC HISTORY: Oncology History  Malignant neoplasm of lower-inner quadrant of right breast of female, estrogen receptor positive ([place])  05/25/2019 Initial Diagnosis   Patient palpated a right breast lump for two years. Diagnostic mammogram showed a 4.5cm right breast mass at the 6 o'clock position involving the overlying skin, 2 benign appearing left breast mass at the 10 o'clock position measuring 2.4cm and 2.1cm, and no axillary adenopathy. Biopsy showed: in the left breast, fibroadenoma and no evidence of malignancy, and in the right breast, IDC with DCIS, grade 3.  ER 70%, PR 30%, Ki-67 40%, HER-2 negative by [facility]    06/10/2019 -  Chemotherapy   The patient had DOXOrubicin (ADRIAMYCIN) chemo injection 118 mg, 60 mg/m2 = 118 mg, Intravenous,  Once, 4 of 4 cycles Dose modification: 50 mg/m2 (original dose 60 mg/m2, Cycle 2, Reason: Dose not tolerated) Administration: 118 mg (06/10/2019), 98 mg (06/24/2019), 98 mg (07/08/2019), 98 mg (07/22/2019) palonosetron (ALOXI) injection 0.25 mg, 0.25 mg, Intravenous,  Once, 4 of 4 cycles Administration: 0.25 mg (06/10/2019), 0.25 mg (06/24/2019), 0.25 mg (07/08/2019), 0.25 mg (07/22/2019) pegfilgrastim-cbqv (UDENYCA) injection 6 mg, 6 mg, Subcutaneous, Once, 4 of 4 cycles Administration: 6 mg (06/12/2019), 6 mg (06/26/2019), 6 mg (07/10/2019), 6 mg (07/24/2019) cyclophosphamide (CYTOXAN) 1,180 mg in sodium chloride 0.9 % 250  mL chemo infusion, 600 mg/m2 = 1,180 mg, Intravenous,  Once, 4 of 4 cycles Dose modification: 500 mg/m2 (original dose 600 mg/m2, Cycle 2, Reason: Dose not tolerated) Administration: 1,180 mg (06/10/2019), 980 mg (06/24/2019), 980 mg (07/08/2019), 980 mg (07/22/2019) PACLitaxel (TAXOL) 156 mg in sodium chloride 0.9 % 250 mL chemo infusion (</= 47m/m2), 80 mg/m2 = 156 mg, Intravenous,  Once, 12 of 12 cycles Dose modification: 65 mg/m2 (original dose 80 mg/m2, Cycle 11, Reason: Provider Judgment), 50 mg/m2 (original dose 80 mg/m2, Cycle 12, Reason: Dose not tolerated) Administration: 156 mg (08/12/2019), 156 mg (08/19/2019), 138 mg (08/26/2019), 138 mg (09/02/2019), 138 mg (09/09/2019), 138 mg (09/16/2019), 114 mg (09/23/2019), 90 mg (09/30/2019), 90 mg (10/07/2019), 90 mg (10/14/2019), 90 mg (10/21/2019), 90 mg (10/28/2019) fosaprepitant (EMEND) 150 mg, dexamethasone (DECADRON) 12 mg in sodium chloride 0.9 % 145 mL IVPB, , Intravenous,  Once, 4 of 4 cycles Administration:  (06/10/2019),  (06/24/2019),  (07/08/2019),  (07/22/2019)  for chemotherapy treatment.     Genetic Testing   No pathogenic variants identified. VUS in [place] c.2378A>G identified on the [facility] Common Hereditary Cancers Panel. The report date is 06/15/2019.  The Common Hereditary Cancers Panel offered by [facility] includes sequencing and/or deletion duplication testing of the following 48 genes: APC, ATM, AXIN2, BARD1, BMPR1A, BRCA1, BRCA2, BRIP1, CDH1, CDKN2A (p14ARF), CDKN2A (p16INK4a), CKD4, CHEK2, CTNNA1, DICER1, EPCAM (Deletion/duplication testing only), GREM1 (promoter region deletion/duplication testing only), KIT, MEN1, MLH1, MSH2, MSH3, MSH6, MUTYH, NBN, NF1, NHTL1, PALB2, PDGFRA, PMS2, POLD1, POLE, PTEN, RAD50, RAD51C, RAD51D, RNF43, SDHB, SDHC, SDHD, SMAD4, SMARCA4. STK11, TP53, TSC1, TSC2, and VHL.  The following genes were evaluated for sequence changes only: SDHA  and HOXB13 c.251G>A variant only.   11/18/2019 Surgery   Right  lumpectomy ([place]): IDC, grade 3, 1.5cm, with grade 2-3 DCIS, 2 right axillary lymph nodes negative.     CHIEF COMPLIANT: Follow-up s/p right lumpectomy to review pathology   INTERVAL HISTORY: Kimberly Mueller is a 34 y.o. with above-mentioned history of right breast cancer who completed neoadjuvant chemotherapy. She underwent a lumpectomy on 11/18/19 with Dr. 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 She presents to the clinic today to review the pathology report and discuss further treatment.   ALLERGIES:  is allergic to adhesive [tape].  MEDICATIONS:  Current Outpatient Medications  Medication Sig Dispense Refill  . bimatoprost (LATISSE) 0.03 % ophthalmic solution Place 1 application into both eyes at bedtime. Place one drop on applicator and apply evenly along the skin of the upper eyelid at base of eyelashes once daily at bedtime; repeat procedure for second eye (use a clean applicator). 3 mL 12  . docusate sodium (COLACE) 250 MG capsule Take 250 mg by mouth 2 (two) times daily.    . etonogestrel (NEXPLANON) 68 MG IMPL implant 1 each by Subdermal route once.    . gabapentin (NEURONTIN) 100 MG capsule Take 1 capsule (100 mg total) by mouth at bedtime. 30 capsule 3  . goserelin (ZOLADEX) 3.6 MG injection Inject 3.6 mg into the skin every 28 (twenty-eight) days.    . Lactobacillus (PROBIOTIC ACIDOPHILUS) CAPS Take 1 capsule by mouth daily. 30 capsule 0  . lidocaine-prilocaine (EMLA) cream Apply to affected area once 30 g 3  . NON FORMULARY [employer] Signature sleep aide    . omeprazole (PRILOSEC) 40 MG capsule Take 1 capsule (40 mg total) by mouth daily. 90 capsule 3  . oxyCODONE (OXY IR/ROXICODONE) 5 MG immediate release tablet Take 1 tablet (5 mg total) by mouth every 6 (six) hours as needed. 10 tablet 0  . prochlorperazine (COMPAZINE) 10 MG tablet Take 1 tablet (10 mg total) by  mouth every 6 (six) hours as needed (Nausea or vomiting). 30 tablet 1  . Sennosides (LAXATIVE) 25 MG TABS Take 1 tablet by mouth 2 (two) times daily as needed (constipation).    . simethicone (MYLICON) 80 MG chewable tablet Chew 80 mg by mouth every 6 (six) hours as needed for flatulence.    . vortioxetine HBr (TRINTELLIX) 5 MG TABS tablet Take 5 mg by mouth daily.     No current facility-administered medications for this visit.    PHYSICAL EXAMINATION: ECOG PERFORMANCE STATUS: 1 - Symptomatic but completely ambulatory  Vitals:   11/25/19 1523  BP: 109/81  Pulse: (!) 107  Resp: 18  Temp: 99.1 F (37.3 C)  SpO2: 99%   Filed Weights   11/25/19 1523  Weight: 127 lb 9.6 oz (57.9 kg)    LABORATORY DATA:  I have reviewed the data as listed CMP Latest Ref Rng & Units 10/28/2019 10/21/2019 10/14/2019  Glucose 70 - 99 mg/dL 94 88 90  BUN 6 - 20 mg/dL 9 8 6  Creatinine 0.44 - 1.00 mg/dL 0.64 0.61 0.62  Sodium 135 - 145 mmol/L 140 141 141  Potassium 3.5 - 5.1 mmol/L 3.6 3.5 3.6  Chloride 98 - 111 mmol/L 107 109 110  CO2 22 - 32 mmol/L 23 23 22  Calcium 8.9 - 10.3 mg/dL 9.6 9.7 9.5  Total Protein 6.5 - 8.1 g/dL 6.9 6.8 6.7  Total Bilirubin 0.3 -   1.2 mg/dL 0.6 0.5 0.5  Alkaline Phos 38 - 126 U/L 63 66 64  AST 15 - 41 U/L 29 31 30  ALT 0 - 44 U/L 32 36 33    Lab Results  Component Value Date   WBC 3.9 (L) 10/28/2019   HGB 11.6 (L) 10/28/2019   HCT 33.9 (L) 10/28/2019   MCV 91.4 10/28/2019   PLT 240 10/28/2019   NEUTROABS 2.9 10/28/2019    ASSESSMENT & PLAN:  Malignant neoplasm of lower-inner quadrant of right breast of female, estrogen receptor positive ([facility]) 05/15/2019:Patient palpated a right breast lump for two years. Diagnostic mammogram showed a 4.5cm right breast mass at the 6 o'clock position involving the overlying skin, 2 benign appearing left breast mass at the 10 o'clock position measuring 2.4cm and 2.1cm, and no axillary adenopathy. Biopsy showed: in the left breast,  fibroadenoma and no evidence of malignancy, and in the right breast, IDC with DCIS, grade 3.ER 70%, PR 30%, Ki-67 40%, HER-2 negative ratio 1.41 T2 N0 stage IIa clinical stage  Treatment plan: 1.Neoadjuvant chemotherapywith dose dense Adriamycin and Cytoxan x4 followed by Taxol x12starting 06/10/2019-10/28/19 2.Breast conserving surgery with sentinel lymph node biopsy 11/18/19: Grade 3 IDC 1.5 cm with intermediate grade and LCIS, margins negative, negative for lymphovascular or perineural invasion, 0/2 lymph nodes negative, ER 70%, PR 30%, Her 2 Neg, Ki 40% 3.Adjuvant radiation 4.Follow-up adjuvant antiestrogen therapy(patient may be opting for oophorectomy plus anastrozole) -----------------------------------------------------------------------------------------------------------------------------------------------  Pathology counseling: I discussed the final pathology report of the patient provided  a copy of this report. I discussed the margins as well as lymph node surgeries. We also discussed the final staging along with previously performed ER/PR and HER-2/neu testing. Patient had a good response to chemotherapy. HER-2 came back positive. Discussed herceptin q 3 weeks for 1 year. Will check coverage IV vs Sub Cut  Treatment plan: Adjuvant radiation followed by adjuvant antiestrogen therapy. Patient plans for oophorectomy with Dr.Rossi Once that is done, she will start anti-estrogen therapy with anastrozole.  RTC in 2 weeks for herceptin  No orders of the defined types were placed in this encounter.  The patient has a good understanding of the overall plan. she agrees with it. she will call with any problems that may develop before the next visit here.  Total time spent: 30 mins including face to face time and time spent for planning, charting and coordination of care  Gudena, Vinay, MD 11/25/2019  I, Molly Dorshimer, am acting as scribe for Dr. Vinay Gudena.  I have  reviewed the above documentation for accuracy and completeness, and I agree with the above.       

## 2019-11-24 NOTE — Assessment & Plan Note (Signed)
05/15/2019:Patient palpated a right breast lump for two years. Diagnostic mammogram showed a 4.5cm right breast mass at the 6 o'clock position involving the overlying skin, 2 benign appearing left breast mass at the 10 o'clock position measuring 2.4cm and 2.1cm, and no axillary adenopathy. 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%, HER-2 negative ratio 1.41 T2 N0 stage IIa clinical stage  Treatment plan: 1.Neoadjuvant chemotherapywith dose dense Adriamycin and Cytoxan x4 followed by Taxol x12starting 06/10/2019-10/28/19 2.Breast conserving surgery with sentinel lymph node biopsy 11/18/19: Grade 3 IDC 1.5 cm with intermediate grade and LCIS, margins negative, negative for lymphovascular or perineural invasion, 0/2 lymph nodes negative, ER 70%, PR 30%, Her 2 Neg, Ki 40% 3.Adjuvant radiation 4.Follow-up adjuvant antiestrogen therapy(patient may be opting for oophorectomy plus anastrozole) -----------------------------------------------------------------------------------------------------------------------------------------------  Pathology counseling: I discussed the final pathology report of the patient provided  a copy of this report. I discussed the margins as well as lymph node surgeries. We also discussed the final staging along with previously performed ER/PR and HER-2/neu testing. Patient had a good response to chemotherapy.  Treatment plan: Adjuvant radiation followed by adjuvant antiestrogen therapy.

## 2019-11-25 ENCOUNTER — Other Ambulatory Visit: Payer: Self-pay

## 2019-11-25 ENCOUNTER — Inpatient Hospital Stay: Payer: No Typology Code available for payment source | Attending: Hematology and Oncology

## 2019-11-25 ENCOUNTER — Inpatient Hospital Stay (HOSPITAL_BASED_OUTPATIENT_CLINIC_OR_DEPARTMENT_OTHER): Payer: No Typology Code available for payment source | Admitting: Hematology and Oncology

## 2019-11-25 VITALS — BP 109/81 | HR 107 | Temp 99.1°F | Resp 18 | Ht 63.0 in | Wt 127.6 lb

## 2019-11-25 DIAGNOSIS — C50311 Malignant neoplasm of lower-inner quadrant of right female breast: Secondary | ICD-10-CM | POA: Insufficient documentation

## 2019-11-25 DIAGNOSIS — Z5111 Encounter for antineoplastic chemotherapy: Secondary | ICD-10-CM | POA: Diagnosis not present

## 2019-11-25 DIAGNOSIS — Z170 Estrogen receptor positive status [ER+]: Secondary | ICD-10-CM | POA: Diagnosis not present

## 2019-11-25 DIAGNOSIS — Z95828 Presence of other vascular implants and grafts: Secondary | ICD-10-CM

## 2019-11-25 LAB — SURGICAL PATHOLOGY

## 2019-11-25 MED ORDER — GOSERELIN ACETATE 3.6 MG ~~LOC~~ IMPL
3.6000 mg | DRUG_IMPLANT | Freq: Once | SUBCUTANEOUS | Status: AC
  Administered 2019-11-25: 3.6 mg via SUBCUTANEOUS

## 2019-11-25 MED ORDER — GOSERELIN ACETATE 3.6 MG ~~LOC~~ IMPL
DRUG_IMPLANT | SUBCUTANEOUS | Status: AC
  Filled 2019-11-25: qty 3.6

## 2019-11-25 NOTE — Patient Instructions (Signed)

## 2019-11-25 NOTE — Progress Notes (Signed)
DISCONTINUE ON PATHWAY REGIMEN - Breast   Dose-Dense AC q14 days:   A cycle is every 14 days:     Doxorubicin      Cyclophosphamide      Pegfilgrastim-xxxx   **Always confirm dose/schedule in your pharmacy ordering system**  Paclitaxel 80 mg/m2 Weekly:   Administer weekly:     Paclitaxel   **Always confirm dose/schedule in your pharmacy ordering system**  REASON: Other Reason PRIOR TREATMENT: [id number]: Dose-Dense AC-T (Paclitaxel Weekly) - [Doxorubicin + Cyclophosphamide q14 Days x 4 Cycles, Followed by Paclitaxel 80 mg/m2 Weekly x 12 Weeks] TREATMENT RESPONSE: Unable to Evaluate  START OFF PATHWAY REGIMEN - Breast   [id number]:Trastuzumab and hyaluronidase-oysk 600 mg/10,000 units SUBQ D1 q21 Days:   A cycle is every 21 days:     Trastuzumab and hyaluronidase-oysk   **Always confirm dose/schedule in your pharmacy ordering system**  Patient Characteristics: Post-Neoadjuvant Therapy and Resection, HER2 Positive, ER Positive, Residual Disease, Adjuvant Targeted Therapy After Neoadjuvant Chemo/Targeted Therapy Therapeutic Status: Post-Neoadjuvant Therapy and Resection ER Status: Positive (+) HER2 Status: Positive (+) PR Status: Positive (+) Residual Invasive Disease Post-Neoadjuvant Therapy<= Yes Intent of Therapy: Curative Intent, Discussed with Patient

## 2019-11-30 ENCOUNTER — Other Ambulatory Visit: Payer: Self-pay | Admitting: *Deleted

## 2019-11-30 MED ORDER — VENLAFAXINE HCL ER 37.5 MG PO CP24
37.5000 mg | ORAL_CAPSULE | Freq: Every day | ORAL | 3 refills | Status: DC
Start: 2019-11-30 — End: 2019-12-25

## 2019-11-30 NOTE — Progress Notes (Signed)
Pt called with c/o hot flashes, depression, mood changes, anxiety and body aches. Per Mendel Ryder NP, prescription for Effexor sent to pt pharmacy.

## 2019-12-02 ENCOUNTER — Other Ambulatory Visit: Payer: Self-pay | Admitting: *Deleted

## 2019-12-02 MED ORDER — GABAPENTIN 100 MG PO CAPS: 100.0000 mg | ORAL_CAPSULE | Freq: Every day | ORAL | 3 refills | Status: DC

## 2019-12-02 NOTE — Progress Notes (Signed)
Pharmacist Chemotherapy Monitoring - Follow Up Assessment    I verify that I have reviewed each item in the below checklist:  . Regimen for the patient is scheduled for the appropriate day and plan matches scheduled date. Marland Kitchen Appropriate non-routine labs are ordered dependent on drug ordered. . If applicable, additional medications reviewed and ordered per protocol based on lifetime cumulative doses and/or treatment regimen.   Plan for follow-up and/or issues identified: No . I-vent associated with next due treatment: No . MD and/or nursing notified: No  Philomena Course 12/02/2019 8:39 AM;emo

## 2019-12-04 NOTE — Progress Notes (Signed)
Patient here today for a consult with Dr. Sondra Come.  Malignant neoplasm of lower-inner quadrant of right breast of female, estrogen receptor positive ([place]) 05/15/2019:Patient palpated a right breast lump for two years. Diagnostic mammogram showed a 4.5cm right breast mass at the 6 o'clock position involving the overlying skin, 2 benign appearing left breast mass at the 10 o'clock position measuring 2.4cm and 2.1cm, and no axillary adenopathy. 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%, HER-2 negative ratio 1.41 T2 N0 stage IIa clinical stage  Treatment plan: per Dr. Lindi Adie 1.Neoadjuvant chemotherapywith dose dense Adriamycin and Cytoxan x4 followed by Taxol x12starting 06/10/2019-10/28/19 2.Breast conserving surgery with sentinel lymph node biopsy 11/18/19: Grade 3 IDC 1.5 cm with intermediate grade and LCIS, margins negative, negative for lymphovascular or perineural invasion, 0/2 lymph nodes negative, ER 70%, PR 30%, Her 2 Neg, Ki 40% 3.Adjuvant radiation 4.Follow-up adjuvant antiestrogen therapy(patient may be opting for oophorectomy plus anastrozole) -----------------------------------------------------------------------------------------------------------------------------------------------  Pathology counseling: I discussed the final pathology report of the patient provided  a copy of this report. I discussed the margins as well as lymph node surgeries. We also discussed the final staging along with previously performed ER/PR and HER-2/neu testing. Patient had a good response to chemotherapy. HER-2 came back positive. Discussed herceptin q 3 weeks for 1 year. Will check coverage IV vs Sub Cut  Past/Anticipated interventions by medical oncology, if any: Chemo yes  Lymphedema issues, if any:  none  Pain issues, if any:  none  SAFETY ISSUES:  Prior radiation? none    Pacemaker/ICD? none   Possible current pregnancy? ?? LMP 12/20   Is the patient on methotrexate? no  Current Complaints / other details:  none  BP 100/65 (BP Location: Left Arm, Patient Position: Sitting, Cuff Size: Normal)   Pulse 68   Temp 97.6 F (36.4 C)   Resp 18   Ht _0  (1.6 m)   Wt 125 lb 3.2 oz (56.8 kg)   SpO2 99%   BMI 22.18 kg/m   Wt Readings from Last 3 Encounters:  12/09/19 125 lb 3.2 oz (56.8 kg)  11/25/19 127 lb 9.6 oz (57.9 kg)  11/18/19 130 lb 11.7 oz (59.3 kg)      De Burrs, RN 12/04/2019,12:52 PM

## 2019-12-08 ENCOUNTER — Other Ambulatory Visit: Payer: Self-pay | Admitting: *Deleted

## 2019-12-08 ENCOUNTER — Telehealth: Payer: Self-pay | Admitting: Emergency Medicine

## 2019-12-08 ENCOUNTER — Telehealth: Payer: Self-pay | Admitting: Hematology and Oncology

## 2019-12-08 DIAGNOSIS — Z170 Estrogen receptor positive status [ER+]: Secondary | ICD-10-CM

## 2019-12-08 DIAGNOSIS — C50311 Malignant neoplasm of lower-inner quadrant of right female breast: Secondary | ICD-10-CM

## 2019-12-08 NOTE — Telephone Encounter (Signed)
Per MD Lindi Adie ok to treat tomorrow (12/09/19) with ECHO from 06/05/2019, MD to order another ECHO.

## 2019-12-08 NOTE — Telephone Encounter (Signed)
Rescheduled 06/04 back to 06/02 per patient's request. Patient is notified of upcoming appointment.

## 2019-12-09 ENCOUNTER — Inpatient Hospital Stay: Payer: No Typology Code available for payment source

## 2019-12-09 ENCOUNTER — Ambulatory Visit
Admission: RE | Admit: 2019-12-09 | Discharge: 2019-12-09 | Disposition: A | Discharge status: Home / Self Care | Payer: No Typology Code available for payment source | Source: Ambulatory Visit | Attending: Radiation Oncology | Admitting: Radiation Oncology

## 2019-12-09 ENCOUNTER — Encounter: Payer: Self-pay | Admitting: Radiation Oncology

## 2019-12-09 ENCOUNTER — Other Ambulatory Visit: Payer: Self-pay

## 2019-12-09 ENCOUNTER — Encounter: Payer: Self-pay | Admitting: *Deleted

## 2019-12-09 VITALS — BP 100/65 | HR 68 | Temp 97.6°F | Resp 18 | Ht 63.0 in | Wt 125.2 lb

## 2019-12-09 VITALS — BP 110/85 | HR 79 | Temp 98.2°F | Resp 16

## 2019-12-09 DIAGNOSIS — Z79899 Other long term (current) drug therapy: Secondary | ICD-10-CM | POA: Insufficient documentation

## 2019-12-09 DIAGNOSIS — Z5111 Encounter for antineoplastic chemotherapy: Secondary | ICD-10-CM | POA: Insufficient documentation

## 2019-12-09 DIAGNOSIS — Z6823 Body mass index (BMI) 23.0-23.9, adult: Secondary | ICD-10-CM | POA: Diagnosis not present

## 2019-12-09 DIAGNOSIS — C50311 Malignant neoplasm of lower-inner quadrant of right female breast: Secondary | ICD-10-CM

## 2019-12-09 DIAGNOSIS — K219 Gastro-esophageal reflux disease without esophagitis: Secondary | ICD-10-CM | POA: Insufficient documentation

## 2019-12-09 DIAGNOSIS — Z9884 Bariatric surgery status: Secondary | ICD-10-CM | POA: Insufficient documentation

## 2019-12-09 DIAGNOSIS — Z800 Family history of malignant neoplasm of digestive organs: Secondary | ICD-10-CM | POA: Insufficient documentation

## 2019-12-09 DIAGNOSIS — Z510 Encounter for antineoplastic radiation therapy: Secondary | ICD-10-CM | POA: Insufficient documentation

## 2019-12-09 DIAGNOSIS — E669 Obesity, unspecified: Secondary | ICD-10-CM | POA: Insufficient documentation

## 2019-12-09 DIAGNOSIS — Z9221 Personal history of antineoplastic chemotherapy: Secondary | ICD-10-CM | POA: Insufficient documentation

## 2019-12-09 DIAGNOSIS — Z170 Estrogen receptor positive status [ER+]: Secondary | ICD-10-CM | POA: Insufficient documentation

## 2019-12-09 DIAGNOSIS — Z87891 Personal history of nicotine dependence: Secondary | ICD-10-CM | POA: Insufficient documentation

## 2019-12-09 DIAGNOSIS — Z01818 Encounter for other preprocedural examination: Secondary | ICD-10-CM | POA: Insufficient documentation

## 2019-12-09 DIAGNOSIS — Z8042 Family history of malignant neoplasm of prostate: Secondary | ICD-10-CM | POA: Insufficient documentation

## 2019-12-09 DIAGNOSIS — Z923 Personal history of irradiation: Secondary | ICD-10-CM | POA: Insufficient documentation

## 2019-12-09 LAB — CMP (CANCER CENTER ONLY)
ALT: 36 U/L (ref 0–44)
AST: 28 U/L (ref 15–41)
Albumin: 4.2 g/dL (ref 3.5–5.0)
Alkaline Phosphatase: 71 U/L (ref 38–126)
Anion gap: 14 (ref 5–15)
BUN: 10 mg/dL (ref 6–20)
CO2: 21 mmol/L — ABNORMAL LOW (ref 22–32)
Calcium: 10.1 mg/dL (ref 8.9–10.3)
Chloride: 107 mmol/L (ref 98–111)
Creatinine: 0.7 mg/dL (ref 0.44–1.00)
GFR, Est AFR Am: >60 mL/min (ref >60)
GFR, Estimated: >60 mL/min (ref >60)
Glucose, Bld: 83 mg/dL (ref 70–99)
Potassium: 3.6 mmol/L (ref 3.5–5.1)
Sodium: 142 mmol/L (ref 135–145)
Total Bilirubin: 0.8 mg/dL (ref 0.3–1.2)
Total Protein: 7.1 g/dL (ref 6.5–8.1)

## 2019-12-09 LAB — CBC WITH DIFFERENTIAL (CANCER CENTER ONLY)
Abs Immature Granulocytes: 0.01 10*3/uL (ref 0.00–0.07)
Basophils Absolute: 0 10*3/uL (ref 0.0–0.1)
Basophils Relative: 1 %
Eosinophils Absolute: 0.2 10*3/uL (ref 0.0–0.5)
Eosinophils Relative: 6 %
HCT: 38.5 % (ref 36.0–46.0)
Hemoglobin: 12.7 g/dL (ref 12.0–15.0)
Immature Granulocytes: 0 %
Lymphocytes Relative: 27 %
Lymphs Abs: 0.8 10*3/uL (ref 0.7–4.0)
MCH: 30.3 pg (ref 26.0–34.0)
MCHC: 33 g/dL (ref 30.0–36.0)
MCV: 91.9 fL (ref 80.0–100.0)
Monocytes Absolute: 0.3 10*3/uL (ref 0.1–1.0)
Monocytes Relative: 11 %
Neutro Abs: 1.5 10*3/uL — ABNORMAL LOW (ref 1.7–7.7)
Neutrophils Relative %: 55 %
Platelet Count: 244 10*3/uL (ref 150–400)
RBC: 4.19 MIL/uL (ref 3.87–5.11)
RDW: 12.7 % (ref 11.5–15.5)
WBC Count: 2.8 10*3/uL — ABNORMAL LOW (ref 4.0–10.5)
nRBC: 0 % (ref 0.0–0.2)

## 2019-12-09 MED ORDER — ACETAMINOPHEN 325 MG PO TABS
650.0000 mg | ORAL_TABLET | Freq: Once | ORAL | Status: AC
  Administered 2019-12-09: 650 mg via ORAL

## 2019-12-09 MED ORDER — ACETAMINOPHEN 325 MG PO TABS
ORAL_TABLET | ORAL | Status: AC
  Filled 2019-12-09: qty 2

## 2019-12-09 MED ORDER — DIPHENHYDRAMINE HCL 25 MG PO CAPS
ORAL_CAPSULE | ORAL | Status: AC
  Filled 2019-12-09: qty 2

## 2019-12-09 MED ORDER — DIPHENHYDRAMINE HCL 25 MG PO CAPS
50.0000 mg | ORAL_CAPSULE | Freq: Once | ORAL | Status: AC
  Administered 2019-12-09: 50 mg via ORAL

## 2019-12-09 MED ORDER — TRASTUZUMAB-HYALURONIDASE-OYSK 600-10000 MG-UNT/5ML ~~LOC~~ SOLN
600.0000 mg | Freq: Once | SUBCUTANEOUS | Status: AC
  Administered 2019-12-09: 600 mg via SUBCUTANEOUS
  Filled 2019-12-09: qty 5

## 2019-12-09 NOTE — Progress Notes (Signed)
Pt received herceptin hylecta injection SQ, tolerated well.  Pt denies any questions/concerns at time of d/c.

## 2019-12-09 NOTE — Patient Instructions (Signed)
Trastuzumab; Hyaluronidase injection What is this medicine? TRASTUZUMAB; HYALURONIDASE (tras TOO zoo mab / hye al ur ON i dase) is used to treat breast cancer and stomach cancer. Trastuzumab is a monoclonal antibody. Hyaluronidase is used to improve the effects of trastuzumab. This medicine may be used for other purposes; ask your health care provider or pharmacist if you have questions. COMMON BRAND NAME(S): HERCEPTIN HYLECTA What should I tell my health care provider before I take this medicine? They need to know if you have any of these conditions:  heart disease  heart failure  lung or breathing disease, like asthma  an unusual or allergic reaction to trastuzumab, or other medications, foods, dyes, or preservatives  pregnant or trying to get pregnant  breast-feeding How should I use this medicine? This medicine is for injection under the skin. It is given by a health care professional in a hospital or clinic setting. Talk to your pediatrician regarding the use of this medicine in children. This medicine is not approved for use in children. Overdosage: If you think you have taken too much of this medicine contact a poison control center or emergency room at once. NOTE: This medicine is only for you. Do not share this medicine with others. What if I miss a dose? It is important not to miss a dose. Call your doctor or health care professional if you are unable to keep an appointment. What may interact with this medicine? This medicine may interact with the following medications:  certain types of chemotherapy, such as daunorubicin, doxorubicin, epirubicin, and idarubicin This list may not describe all possible interactions. Give your health care provider a list of all the medicines, herbs, non-prescription drugs, or dietary supplements you use. Also tell them if you smoke, drink alcohol, or use illegal drugs. Some items may interact with your medicine. What should I watch for while  using this medicine? Visit your doctor for checks on your progress. Report any side effects. Continue your course of treatment even though you feel ill unless your doctor tells you to stop. Call your doctor or health care professional for advice if you get a fever, chills or sore throat, or other symptoms of a cold or flu. Do not treat yourself. Try to avoid being around people who are sick. You may experience fever, chills and shaking during your first infusion. These effects are usually mild and can be treated with other medicines. Report any side effects during the infusion to your health care professional. Fever and chills usually do not happen with later infusions. Do not become pregnant while taking this medicine or for 7 months after stopping it. Women should inform their doctor if they wish to become pregnant or think they might be pregnant. Women of child-bearing potential will need to have a negative pregnancy test before starting this medicine. There is a potential for serious side effects to an unborn child. Talk to your health care professional or pharmacist for more information. Do not breast-feed an infant while taking this medicine or for 7 months after stopping it. What side effects may I notice from receiving this medicine? Side effects that you should report to your doctor or health care professional as soon as possible:  allergic reactions like skin rash, itching or hives, swelling of the face, lips, or tongue  breathing problems  chest pain or palpitations  cough  fever  general ill feeling or flu-like symptoms  signs of worsening heart failure like breathing problems; swelling in your legs and   feet Side effects that usually do not require medical attention (report these to your doctor or health care professional if they continue or are bothersome):  bone pain  changes in taste  diarrhea  joint pain  nausea/vomiting  unusually weak or tired  weight loss This  list may not describe all possible side effects. Call your doctor for medical advice about side effects. You may report side effects to FDA at 1-800-FDA-1088. Where should I keep my medicine? This drug is given in a hospital or clinic and will not be stored at home. NOTE: This sheet is a summary. It may not cover all possible information. If you have questions about this medicine, talk to your doctor, pharmacist, or health care provider.  2020 Elsevier/Gold Standard (2017-09-13 21:54:17)  

## 2019-12-09 NOTE — Progress Notes (Signed)
Radiation Oncology         [phone]) [phone] ________________________________  Name: Kimberly Mueller MRN: [id number]  Date: 12/09/2019  DOB: 28-Nov-1985  Re-Evaluation Note  CC: Jilda Panda, MD  Nicholas Lose, MD    ICD-10-CM   1. 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 She was seen in the multidisciplinary breast clinic on 05/27/2019. At that time, it was recommended that the patient proceed with genetics testing, MRI/chest CT/bone scan, port-a-cath placement, neoadjuvant chemotherapy, possible surgery, adjuvant radiation therapy, and aromatase inhibitor.  The patient underwent genetics testing on 05/27/2019 that was negative without any pathogenic variants identified.  Of note, the patient underwent an abdominal ultrasound on 05/29/2019 for reported liver mass that was apparent at time of gastric bypass surgery. Results showed echogenic liver masses in the left lobe measuring 3.2 x 3.1 x 3.5 cm and 1.1 x 1.0 x 1.2 cm respectively. The appearance of those lesions were suspicious for hemangiomas. The pancreas and inferior vena cava were obscured by gas.  CT of chest/abdomen/pelvis on 06/05/2019 showed an indeterminate 3.1 cm low-attenuation mass within the lateral left hepatic lobe without exclusion of hepatic metastatic disease. MRI of liver was recommended for further characterization. The right breast mass was compatible with the patient's biopsy-proven breast carcinoma. MRI of bilateral breasts on 06/05/2019 showed a mass within the inferior right breast that was compatible with the patient's biopsy-proven carcinoma. There was also noted to be an indeterminate 6 mm oval circumscribed enhancing  mass that was posterior and slightly lateral to the first mass. Finally, there were biopsy-proven fibroadenomas within the left breast. Echocardiogram on 06/05/2019 showed an EF of 55-60% with normal LV function.  The patient began neoadjuvant chemotherapy with dose dense Adriamycin and Cytoxan x4 followed by Taxol x12 on 06/10/2019 under the care of Dr. Lindi Adie.  Bone scan on 06/12/2019 showed a single focus of abnormal osseous tracer accumulation at the distal left femoral metadiaphysis; metastatic disease could not be excluded.  MRI of liver on 06/15/2019 showed a 3.5 cm enhancing mass in the medial right lower breast that corresponded to the patient's known primary breast neoplasm. It also showed a 3.7 cm solid enhancing mass in segment 2 of the liver, favoring benign hepatic adenoma, although metastasis could not be excluded. Finally, there was an additional 7 mm lesion in segment 4B that was poorly evaluated. PET scan vs tissue sampling was recommended.   Given the separate right lower breast mass seen in the liver MRI, the patient underwent an ultrasound of the right breast on 06/16/2019 that showed an indeterminate right breast mass that measured 0.8 cm at the 7 o'clock position. Biopsy revealed fibroadenoma.  X-ray of left femur on 06/17/2019 showed an ill-defined sclerotic focus in the distal femoral diaphysis that corresponded well to the site of increased uptake on the bone scan.  On 06/24/2019, the patient was noted to experience some chemotherapy-related fatigue and leukopenia/neutropenia. The dosage of cycle 2 was reduced.  MRI of left femur on 07/02/2019 was negative for metastatic disease or acute abnormality. The focus on the bone scan was noted to likely represent a tiny enchondroma or focus of active marrow. No follow-up imaging was recommended.  Of note, the patient was  seen in the ED on 07/03/2019 with complaint of sudden onset rectal pain. The patient was found to have some mild  swelling noted diffusely around the rectum with a small fissure noted at the 6 o'clock position anteriorly. She was discharged with the appropriate recommendations.  On 08/26/2019, the patient was seen by Wilber Bihari, oncology NP, and was noted to be tolerating the Taxol quite well. Her liver enzymes were slightly elevated but were being monitored. The patient wanted her Nexplanon removed and so she was placed on Zoladex with referral to Gyn-oncology for possible bilateral oophorectomy.  The patient was seen in the ED on 09/02/2019 with complaint of generalized abdominal pain. CT of abdomen/pelvis at that time did not show any evidence of acute intra-abdominal or pelvic abnormality. The previously noted left lobe liver mass was more difficult to visualize on this study. There was no new focal hepatic abnormality demonstrated. There was an interval decrease in the size of the previously noted right lower breast mass. The patient was discharged home.  The patient was seen in consultation with Dr. Denman George on 09/03/2019. They discussed Goserelin injections vs surgical castration with BSO.   The patient was seen several times by Dr. Lindi Adie and was noted to be experiencing some fatigue, elevated LFTs that were being monitored, neutropenia that improved, and hypokalemia secondary to Taxol. Dosage of Taxol was decreased on 09/30/2019. She is on a potassium supplement. She was also noted to have some blurred vision secondary to Dexamethasone that should improve after chemotherapy completion. On 10/14/2019, the patient began to report jaw pain when opening her mouth and was found to have a salivary gland stone.  MRI of bilateral breasts on 10/21/2019 showed an interval decrease in size of the known breast cancer within the inferior right breast that then measured up to 1.9 cm compared to 3.6 cm previously. It also showed the biopsy-proven bilateral fibroadenomas. There were no new suspicious enhancements in the  bilateral breasts.  Chemotherapy was completed on 10/28/2019.  The patient underwent a right breast lumpectomy with right deep axillary sentinel lymph node biopsy on 11/18/2019 performed by Dr. Donne Hazel. Of note, she also had her Nexplanon removed at that time. Pathology from the procedure revealed grade 3 invasive ductal carcinoma with intermediate to high-grade DCIS. Carcinoma involved the inferior resection edge. No lymphovascular perineural invasion was present. Two right axillary sentinel lymph nodes were biopsied and both were negative for carcinoma. Right additional medial, posterior, and inferior margins were excised and biopsied and all showed benign breast parenchyma without carcinoma.  The patient was last seen by Dr. Lindi Adie on 11/25/2019, during which time it was recommended that the patient proceed with adjuvant radiation therapy followed by adjuvant antiestrogen therapy (possible oophorectomy plus Anastrozole).  Of note, the patient underwent an echocardiogram yesterday. Results are pending.  On review of systems, the patient reports no complaints. She denies lymphedema and any other symptoms.    Allergies:  is allergic to adhesive [tape].  Meds: Current Outpatient Medications  Medication Sig Dispense Refill  . bimatoprost (LATISSE) 0.03 % ophthalmic solution Place 1 application into both eyes at bedtime. Place one drop on applicator and apply evenly along the skin of the upper eyelid at base of eyelashes once daily at bedtime; repeat procedure for second eye (use a clean applicator). 3 mL 12  . docusate sodium (COLACE) 250 MG capsule Take 250 mg by mouth 2 (two) times daily.    Marland Kitchen etonogestrel (NEXPLANON) 68 MG IMPL implant 1 each by Subdermal  route once.    . gabapentin (NEURONTIN) 100 MG capsule Take 1 capsule (100 mg total) by mouth at bedtime. 30 capsule 3  . goserelin (ZOLADEX) 3.6 MG injection Inject 3.6 mg into the skin every 28 (twenty-eight) days.    . Lactobacillus  (PROBIOTIC ACIDOPHILUS) CAPS Take 1 capsule by mouth daily. 30 capsule 0  . NON FORMULARY [place] Therapist, art    . omeprazole (PRILOSEC) 40 MG capsule Take 1 capsule (40 mg total) by mouth daily. 90 capsule 3  . oxyCODONE (OXY IR/ROXICODONE) 5 MG immediate release tablet Take 1 tablet (5 mg total) by mouth every 6 (six) hours as needed. 10 tablet 0  . Sennosides (LAXATIVE) 25 MG TABS Take 1 tablet by mouth 2 (two) times daily as needed (constipation).    . simethicone (MYLICON) 80 MG chewable tablet Chew 80 mg by mouth every 6 (six) hours as needed for flatulence.    . venlafaxine XR (EFFEXOR-XR) 37.5 MG 24 hr capsule Take 1 capsule (37.5 mg total) by mouth daily with breakfast. 30 capsule 3  . vortioxetine HBr (TRINTELLIX) 5 MG TABS tablet Take 5 mg by mouth daily.     No current facility-administered medications for this encounter.    Physical Findings: The patient is in no acute distress. Patient is alert and oriented.  height is '5\' 3"'  (1.6 m) and weight is 125 lb 3.2 oz (56.8 kg). Her temperature is 97.6 F (36.4 C). Her blood pressure is 100/65 and her pulse is 68. Her respiration is 18 and oxygen saturation is 99%.  No significant changes. Lungs are clear to auscultation bilaterally. Heart has regular rate and rhythm. No palpable cervical, supraclavicular, or axillary adenopathy. Abdomen soft, non-tender, normal bowel sounds. Left breast: no palpable mass, nipple discharge or bleeding. Right breast: Well-healing scar in the inferior aspect of the breast.  She does have significant bruising and swelling in the breast.  No signs of infection within the breast.  Patient has a separate scar in the axillary region from her sentinel node procedure.  Lab Findings: Lab Results  Component Value Date   WBC 2.8 (L) 12/09/2019   HGB 12.7 12/09/2019   HCT 38.5 12/09/2019   MCV 91.9 12/09/2019   PLT 244 12/09/2019    Radiographic Findings: NM Sentinel Node Inj-No Rpt  (Breast)  Result Date: 11/18/2019 Sulfur colloid was injected by the nuclear medicine technologist for melanoma sentinel node.   MM Breast Surgical Specimen  Result Date: 11/18/2019 CLINICAL DATA:  Specimen radiograph status post right breast lumpectomy. EXAM: SPECIMEN RADIOGRAPH OF THE RIGHT BREAST COMPARISON:  Previous exam(s). FINDINGS: Status post excision of the right breast. The radioactive seed and biopsy marker clip are present and completely intact. These findings were communicated to the OR at 1:09 p.m. IMPRESSION: Specimen radiograph of the right breast. Electronically Signed   By: Ammie Ferrier M.D.   On: 11/18/2019 13:10   MM RT RADIOACTIVE SEED LOC MAMMO GUIDE  Result Date: 11/17/2019 CLINICAL DATA:  Localization prior to surgery EXAM: MAMMOGRAPHIC GUIDED RADIOACTIVE SEED LOCALIZATION OF THE RIGHT BREAST COMPARISON:  Previous exam(s). FINDINGS: Patient presents for radioactive seed localization prior to surgery. I met with the patient and we discussed the procedure of seed localization including benefits and alternatives. We discussed the high likelihood of a successful procedure. We discussed the risks of the procedure including infection, bleeding, tissue injury and further surgery. We discussed the low dose of radioactivity involved in the procedure. Informed, written consent was given. The usual  time-out protocol was performed immediately prior to the procedure. Using mammographic guidance, sterile technique, 1% lidocaine and an I-125 radioactive seed, the patient's known malignancy was localized using a medial approach. The follow-up mammogram images confirm the seed in the expected location and were marked for Dr. Donne Hazel. Follow-up survey of the patient confirms presence of the radioactive seed. Order number of I-125 seed:  [id number]. Total activity:  6.644 millicuries reference Date: November 04, 2019 The patient tolerated the procedure well and was released from the [facility].  She was given instructions regarding seed removal. IMPRESSION: Radioactive seed localization right breast. No apparent complications. 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  Given the patient's young age I would not recommend hypofractionated accelerated radiation therapy.  We discussed that she would be a good candidate for conventional radiation therapy over approximately 6-1/2 weeks.  I discussed the general course of radiation treatment anticipated side effects and potential long-term toxicities of radiation therapy with the patient.  She appeared to understand and wishes to proceed with planned course of treatment.  In light of the significant bruising within the breast I will delay simulation for approximately 10 days with treatments to begin approximately 5 to 6 weeks postop.  Plan:  Patient is scheduled for CT simulation on June 14 with treatments to begin the week of June 21.  She will receive conventional radiation therapy over 6-1/2 weeks.  -----------------------------------  Blair Promise, PhD, MD  This document serves as a record of services personally performed by Gery Pray, MD. It was created on his behalf by Clerance Lav, a trained medical scribe. The creation of this record is based on the scribe's personal observations and the provider's statements to them. This document has been checked and approved by the attending provider.

## 2019-12-10 ENCOUNTER — Telehealth: Payer: Self-pay | Admitting: *Deleted

## 2019-12-11 ENCOUNTER — Inpatient Hospital Stay: Payer: No Typology Code available for payment source

## 2019-12-14 ENCOUNTER — Other Ambulatory Visit: Payer: Self-pay | Admitting: *Deleted

## 2019-12-14 DIAGNOSIS — C50311 Malignant neoplasm of lower-inner quadrant of right female breast: Secondary | ICD-10-CM

## 2019-12-15 ENCOUNTER — Telehealth: Payer: Self-pay | Admitting: *Deleted

## 2019-12-15 NOTE — Telephone Encounter (Signed)
Pt notified had episode on Saturday "fainted only for a few seconds". Pt took bp which was normal. Went to the pool earlier that day. Lucianne Lei, [place] notified of pt episode. Per Lucianne Lei will monitor for now. Pt notified if episode happens again to call and be seen immediately. Encourage pt to drink plenty of liquids to stay hydrated Received verbal understanding.

## 2019-12-16 ENCOUNTER — Other Ambulatory Visit: Payer: Self-pay

## 2019-12-16 ENCOUNTER — Ambulatory Visit (HOSPITAL_COMMUNITY)
Admission: RE | Admit: 2019-12-16 | Discharge: 2019-12-16 | Disposition: A | Discharge status: Home / Self Care | Payer: No Typology Code available for payment source | Source: Ambulatory Visit | Attending: Hematology and Oncology | Admitting: Hematology and Oncology

## 2019-12-16 DIAGNOSIS — C50311 Malignant neoplasm of lower-inner quadrant of right female breast: Secondary | ICD-10-CM | POA: Diagnosis present

## 2019-12-16 DIAGNOSIS — Z170 Estrogen receptor positive status [ER+]: Secondary | ICD-10-CM | POA: Insufficient documentation

## 2019-12-16 NOTE — Progress Notes (Signed)
  Echocardiogram 2D Echocardiogram has been performed.  Kimberly Mueller 12/16/2019, 9:53 AM

## 2019-12-17 ENCOUNTER — Encounter: Payer: Self-pay | Admitting: *Deleted

## 2019-12-17 NOTE — Progress Notes (Signed)
RN successfully faxed medical verification form for the [facility] per pt request.

## 2019-12-18 ENCOUNTER — Encounter: Payer: Self-pay | Admitting: Gynecologic Oncology

## 2019-12-18 ENCOUNTER — Inpatient Hospital Stay (HOSPITAL_BASED_OUTPATIENT_CLINIC_OR_DEPARTMENT_OTHER): Payer: No Typology Code available for payment source | Admitting: Gynecologic Oncology

## 2019-12-18 ENCOUNTER — Other Ambulatory Visit: Payer: Self-pay

## 2019-12-18 VITALS — BP 115/68 | HR 75 | Temp 98.5°F | Resp 18 | Ht 63.0 in | Wt 123.1 lb

## 2019-12-18 DIAGNOSIS — Z170 Estrogen receptor positive status [ER+]: Secondary | ICD-10-CM | POA: Diagnosis not present

## 2019-12-18 DIAGNOSIS — C50311 Malignant neoplasm of lower-inner quadrant of right female breast: Secondary | ICD-10-CM

## 2019-12-18 DIAGNOSIS — Z510 Encounter for antineoplastic radiation therapy: Secondary | ICD-10-CM | POA: Diagnosis not present

## 2019-12-18 NOTE — Progress Notes (Signed)
Follow-up Note: Gyn-Onc  Consult was requested by Dr. Lindi Adie for the evaluation of Kimberly Mueller 34 y.o. female  CC:  Chief Complaint  Patient presents with  . Malignant neoplasm of lower-inner quadrant of right breast o    Surgical planning    Assessment/Plan:  [name]  is a 34 y.o.  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 Currently receiving goserelin injections. Desires permanent surgical castration for reduction in breast cancer recurrence risk. She understands the associated permanent sterility associated with this.   She is about to embark on radiation through July. My recommendation is to wait for 1 month after completing radiation. We are planning surgery with robotic BSO on 03/08/20.  I discussed surgical risk including  bleeding, infection, damage to internal organs (such as bladder,ureters, bowels), blood clot, reoperation and rehospitalization. I discussed anticipated surgical recovery, time off of work (2 weeks) and restrictions.   HPI: [name] is a very pleasant 34 year old G0 who was seen in consultation at the request of Dr Lindi Adie for evaluation of hormone receptor positive breast cancer desiring chemical versus surgical castration.  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  Treatment plan was initiated with neoadjuvant chemotherapy with Adriamycin Cytoxan and Taxol.  She is currently undergoing the Taxol portion of this.  Plan is for surgical excision followed by radiation after chemotherapy.  She underwent genetic testing on June 15, 2019 which was negative for BRCA mutations.  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  The patient is otherwise very healthy.  She has a history of obesity which was treated with a laparoscopic gastric bypass in [place] in 2020.  She lost 90 pounds after this.  She is of  normal body weight with a BMI of 23.  The patient has had no other prior abdominal surgeries besides this gastric bypass.  She has never been pregnant.  She is somewhat ambivalent about children but decided after her breast cancer diagnosis that she would unlikely ever try for pregnancy given the hormone receptor positive nature of her breast cancer.  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  She has been vaccinated against HPV.  Interval Hx:   The patient has completed chemotherapy and surgery for her breast cancer. She has planned radiation to begin in June/July.  She desires definitive surgical castration. She is currently using Herceptin and will be on this for extended therapy. She is receiving goserelin injections and has had her implanon removed.   Current Meds:  Outpatient Encounter Medications as of 12/18/2019  Medication Sig  . bimatoprost (LATISSE) 0.03 % ophthalmic solution Place 1 application into both eyes at bedtime. Place one drop on applicator and apply evenly along the skin of the upper eyelid at base of eyelashes once daily at bedtime; repeat procedure for second eye (use a clean applicator).  Marland Kitchen docusate sodium (COLACE) 250 MG capsule Take 250 mg by mouth 2 (two) times daily.  Marland Kitchen etonogestrel (NEXPLANON) 68 MG IMPL implant 1 each by Subdermal route once.  . gabapentin (NEURONTIN) 100 MG capsule Take 1 capsule (100 mg total) by mouth at bedtime.  Marland Kitchen goserelin (ZOLADEX) 3.6 MG injection Inject 3.6 mg into the skin every 28 (twenty-eight) days.  . Lactobacillus (PROBIOTIC ACIDOPHILUS) CAPS Take  1 capsule by mouth daily.  . NON FORMULARY [facility] Signature sleep aide  . omeprazole (PRILOSEC) 40 MG capsule Take 1 capsule (40 mg total) by mouth daily.  Marland Kitchen oxyCODONE (OXY IR/ROXICODONE) 5 MG immediate release tablet Take 1 tablet (5 mg total) by mouth every 6 (six) hours as  needed.  . Sennosides (LAXATIVE) 25 MG TABS Take 1 tablet by mouth 2 (two) times daily as needed (constipation).  . simethicone (MYLICON) 80 MG chewable tablet Chew 80 mg by mouth every 6 (six) hours as needed for flatulence.  . venlafaxine XR (EFFEXOR-XR) 37.5 MG 24 hr capsule Take 1 capsule (37.5 mg total) by mouth daily with breakfast.  . vortioxetine HBr (TRINTELLIX) 5 MG TABS tablet Take 5 mg by mouth daily.  . [DISCONTINUED] prochlorperazine (COMPAZINE) 10 MG tablet Take 1 tablet (10 mg total) by mouth every 6 (six) hours as needed (Nausea or vomiting).   No facility-administered encounter medications on file as of 12/18/2019.    Allergy:  Allergies  Allergen Reactions  . Adhesive [Tape]     Sensitivity to tape (not sure what kind)    Social Hx:   Social History   Socioeconomic History  . Marital status: Married    Spouse name: Not on file  . Number of children: Not on file  . Years of education: Not on file  . Highest education level: Not on file  Occupational History  . Not on file  Tobacco Use  . Smoking status: Former Research scientist (life sciences)  . Smokeless tobacco: Never Used  Substance and Sexual Activity  . Alcohol use: Never  . Drug use: Never  . Sexual activity: Not on file  Other Topics Concern  . Not on file  Social History Narrative  . Not on file   Social Determinants of Health   Financial Resource Strain:   . Difficulty of Paying Living Expenses:   Food Insecurity:   . Worried About Charity fundraiser in the Last Year:   . Arboriculturist in the Last Year:   Transportation Needs:   . Film/video editor (Medical):   Marland Kitchen Lack of Transportation (Non-Medical):   Physical Activity:   . Days of Exercise per Week:   . Minutes of Exercise per Session:   Stress:   . Feeling of Stress :   Social Connections:   . Frequency of Communication with Friends and Family:   . Frequency of Social Gatherings with Friends and Family:   . Attends Religious Services:   . Active  Member of Clubs or Organizations:   . Attends Archivist Meetings:   Marland Kitchen Marital Status:   Intimate Partner Violence:   . Fear of Current or Ex-Partner:   . Emotionally Abused:   Marland Kitchen Physically Abused:   . Sexually Abused:     Past Surgical Hx:  Past Surgical History:  Procedure Laterality Date  . BREAST LUMPECTOMY WITH RADIOACTIVE SEED AND SENTINEL LYMPH NODE BIOPSY Right 11/18/2019   Procedure: RIGHT BREAST SEED GUIDED LUMPECTOMY, RIGHT AXILLARY SENTINEL LYMPH NODE BIOSPY;  Surgeon: Rolm Bookbinder, MD;  Location: [facility];  Service: General;  Laterality: Right;  GENERAL AND PECTORAL BLOCK  . FOREIGN BODY REMOVAL N/A 11/18/2019   Procedure: NEXPLANON REMOVAL;  Surgeon: Rolm Bookbinder, MD;  Location: [facility];  Service: General;  Laterality: N/A;  GENERAL AND PECTORAL BLOCK  . GASTRIC BYPASS    . HERNIA REPAIR    . PORT-A-CATH REMOVAL N/A 11/18/2019   Procedure:  REMOVAL PORT-A-CATH;  Surgeon: Rolm Bookbinder, MD;  Location: [facility];  Service: General;  Laterality: N/A;  GENERAL AND PECTORAL BLOCK  . PORTACATH PLACEMENT Right 06/09/2019   Procedure: INSERTION PORT-A-CATH WITH ULTRASOUND;  Surgeon: Rolm Bookbinder, MD;  Location: [facility];  Service: General;  Laterality: Right;    Past Medical Hx:  Past Medical History:  Diagnosis Date  . Breast cancer ([place])   . Cancer [facility])    breast  . Family history of pancreatic cancer   . Family history of prostate cancer   . GERD (gastroesophageal reflux disease)     Past Gynecological History:   No LMP recorded. Patient has had an implant.  Family Hx:  Family History  Problem Relation Age of Onset  . Prostate cancer Father   . Pancreatic cancer Paternal Grandmother     Review of Systems:  Constitutional  Feels well,    ENT Normal appearing ears and nares bilaterally Skin/Breast  No rash, sores, jaundice, itching, dryness Cardiovascular  No  chest pain, shortness of breath, or edema  Pulmonary  No cough or wheeze.  Gastro Intestinal  No nausea, vomitting, or diarrhoea. No bright red blood per rectum, no abdominal pain, change in bowel movement, +constipation.  Genito Urinary  No frequency, urgency, dysuria, no bleeding Musculo Skeletal  No myalgia, arthralgia, joint swelling or pain  Neurologic  No weakness, numbness, change in gait,  Psychology  No depression, anxiety, insomnia.   Vitals:  Blood pressure 115/68, pulse 75, temperature 98.5 F (36.9 C), temperature source Oral, resp. rate 18, height '5\' 3"'  (1.6 m), weight 123 lb 1.6 oz (55.8 kg), SpO2 100 %.  Physical Exam: WD in NAD Neck  Supple NROM, without any enlargements.  Lymph Node Survey No cervical supraclavicular or inguinal adenopathy Cardiovascular  Pulse normal rate, regularity and rhythm. S1 and S2 normal.  Lungs  Clear to auscultation bilateraly, without wheezes/crackles/rhonchi. Good air movement.  Skin  No rash/lesions/breakdown  Psychiatry  Alert and oriented to person, place, and time  Abdomen  Normoactive bowel sounds, abdomen soft, non-tender and nonobese without evidence of hernia.  Back No CVA tenderness Genito Urinary  Vulva/vagina: Normal external female genitalia.  No lesions. No discharge or bleeding.  Bladder/urethra:  No lesions or masses, well supported bladder  Vagina: normal, narrow, atrophic  Cervix: Normal appearing, no lesions.  Uterus:  Small, mobile, no parametrial involvement or nodularity.  Adnexa: no palpable masses. Rectal  deferred Extremities  No bilateral cyanosis, clubbing or edema.   Thereasa Solo, MD  12/18/2019, 4:19 PM

## 2019-12-18 NOTE — Patient Instructions (Signed)
Preparing for your Surgery  Plan for surgery on March 08, 2020 with Dr. Everitt Amber at [facility] will be scheduled for a robotic assisted laparoscopic bilateral salpingo-oophorectomy.   Pre-operative Testing -You will receive a phone call from presurgical testing at [facility] to arrange for a pre-operative appointment over the phone, lab appointment, and COVID test. The COVID test normally happens 3 days prior to the surgery and they ask that you self quarantine after the test up until surgery to decrease chance of exposure.  -Bring your insurance card, copy of an advanced directive if applicable, medication list  -At that visit, you will be asked to sign a consent for a possible blood transfusion in case a transfusion becomes necessary during surgery.  The need for a blood transfusion is rare but having consent is a necessary part of your care.     -You should not be taking blood thinners or aspirin at least ten days prior to surgery unless instructed by your surgeon.  -Do not take supplements such as fish oil (omega 3), red yeast rice, turmeric before your surgery.   Day Before Surgery at [place] will be asked to take in a light diet the day before surgery. You will be advised you can have clear liquids after midnight and up until 3 hours before your surgery.    Eat a light diet the day before surgery.  Examples including soups, broths, toast, yogurt, mashed potatoes.  AVOID GAS PRODUCING FOODS. Things to avoid include carbonated beverages (fizzy beverages), raw fruits and raw vegetables, or beans.   If your bowels are filled with gas, your surgeon will have difficulty visualizing your pelvic organs which increases your surgical risks.  Your role in recovery Your role is to become active as soon as directed by your doctor, while still giving yourself time to heal.  Rest when you feel tired. You will be asked to do the following in order to speed your  recovery:  - Cough and breathe deeply. This helps to clear and expand your lungs and can prevent pneumonia after surgery.  - [place]. Do mild physical activity. Walking or moving your legs help your circulation and body functions return to normal. Do not try to get up or walk alone the first time after surgery.   -If you develop swelling on one leg or the other, pain in the back of your leg, redness/warmth in one of your legs, please call the office or go to the Emergency Room to have a doppler to rule out a blood clot. For shortness of breath, chest pain-seek care in the Emergency Room as soon as possible. - Actively manage your pain. Managing your pain lets you move in comfort. We will ask you to rate your pain on a scale of zero to 10. It is your responsibility to tell your doctor or nurse where and how much you hurt so your pain can be treated.  Special Considerations -If you are diabetic, you may be placed on insulin after surgery to have closer control over your blood sugars to promote healing and recovery.  This does not mean that you will be discharged on insulin.  If applicable, your oral antidiabetics will be resumed when you are tolerating a solid diet.  -Your final pathology results from surgery should be available around one week after surgery and the results will be relayed to you when available.  -Dr. Lahoma Crocker is the surgeon that  assists your GYN Oncologist with surgery.  If you end up staying the night, the next day after your surgery you will either see Dr. Denman George, Dr. Berline Lopes, or Dr. Lahoma Crocker.  -FMLA forms can be faxed to [phone] and please allow 5-7 business days for completion.  Pain Management After Surgery -You will be prescribed your pain medication and bowel regimen medications before surgery closer to the date so that you can have these available when you are discharged from the hospital. The pain medication is for use ONLY AFTER  surgery and a new prescription will not be given.   -Make sure that you have Tylenol and Ibuprofen at home to use on a regular basis after surgery for pain control. We recommend alternating the medications every hour to six hours since they work differently and are processed in the body differently for pain relief.  -Review the attached handout on narcotic use and their risks and side effects.   Bowel Regimen -You will be prescribed Sennakot-S to take nightly to prevent constipation especially if you are taking the narcotic pain medication intermittently.  It is important to prevent constipation and drink adequate amounts of liquids. You can stop taking this medication when you are not taking pain medication and you are back on your normal bowel routine.  Risks of Surgery Risks of surgery are low but include bleeding, infection, damage to surrounding structures, re-operation, blood clots, and very rarely death.   Blood Transfusion Information (For the consent to be signed before surgery)  We will be checking your blood type before surgery so in case of emergencies, we will know what type of blood you would need.                                            WHAT IS A BLOOD TRANSFUSION?  A transfusion is the replacement of blood or some of its parts. Blood is made up of multiple cells which provide different functions.  Red blood cells carry oxygen and are used for blood loss replacement.  White blood cells fight against infection.  Platelets control bleeding.  Plasma helps clot blood.  Other blood products are available for specialized needs, such as hemophilia or other clotting disorders. BEFORE THE TRANSFUSION  Who gives blood for transfusions?   You may be able to donate blood to be used at a later date on yourself (autologous donation).  Relatives can be asked to donate blood. This is generally not any safer than if you have received blood from a stranger. The same precautions are  taken to ensure safety when a relative's blood is donated.  Healthy volunteers who are fully evaluated to make sure their blood is safe. This is blood bank blood. Transfusion therapy is the safest it has ever been in the practice of medicine. Before blood is taken from a donor, a complete history is taken to make sure that person has no history of diseases nor engages in risky social behavior (examples are intravenous drug use or sexual activity with multiple partners). The donor's travel history is screened to minimize risk of transmitting infections, such as malaria. The donated blood is tested for signs of infectious diseases, such as HIV and hepatitis. The blood is then tested to be sure it is compatible with you in order to minimize the chance of a transfusion reaction. If you or a relative donates  blood, this is often done in anticipation of surgery and is not appropriate for emergency situations. It takes many days to process the donated blood. RISKS AND COMPLICATIONS Although transfusion therapy is very safe and saves many lives, the main dangers of transfusion include:   Getting an infectious disease.  Developing a transfusion reaction. This is an allergic reaction to something in the blood you were given. Every precaution is taken to prevent this. The decision to have a blood transfusion has been considered carefully by your caregiver before blood is given. Blood is not given unless the benefits outweigh the risks.  AFTER SURGERY INSTRUCTIONS  Return to work: 4-6 weeks if applicable  Activity: 1. Be up and out of the bed during the day.  Take a nap if needed.  You may walk up steps but be careful and use the hand rail.  Stair climbing will tire you more than you think, you may need to stop part way and rest.   2. No lifting or straining for 6 weeks over 10 pounds. No pushing, pulling, straining for 6 weeks.  3. No driving for 1 week(s).  Do not drive if you are taking narcotic pain  medicine and make sure that your reaction time has returned.   4. You can shower as soon as the next day after surgery. Shower daily.  Use soap and water on your incision and pat dry; don't rub.  No tub baths or submerging your body in water until cleared by your surgeon. If you have the soap that was given to you by pre-surgical testing that was used before surgery, you do not need to use it afterwards because this can irritate your incisions.   5. No sexual activity and nothing in the vagina for 2 weeks.  6. You may experience a small amount of clear drainage from your incisions, which is normal.  If the drainage persists, increases, or changes color please call the office.  7. Do not use creams, lotions, or ointments such as neosporin on your incisions after surgery until advised by your surgeon because they can cause removal of the dermabond glue on your incisions.    8. You may experience vaginal spotting after surgery.  The spotting is normal but if you experience heavy bleeding, call our office.  9. Take Tylenol or ibuprofen first for pain and only use narcotic pain medication for severe pain not relieved by the Tylenol or Ibuprofen.  Monitor your Tylenol intake to a max of 4,000 mg in a 24 hour period. You can alternate these medications after surgery.  Diet: 1. Low sodium Heart Healthy Diet is recommended.  2. It is safe to use a laxative, such as Miralax or Colace, if you have difficulty moving your bowels. You have been prescribed Sennakot at bedtime every evening to keep bowel movements regular and to prevent constipation.    Wound Care: 1. Keep clean and dry.  Shower daily.  Reasons to call the Doctor:  Fever - Oral temperature greater than 100.4 degrees Fahrenheit  Foul-smelling vaginal discharge  Difficulty urinating  Nausea and vomiting  Increased pain at the site of the incision that is unrelieved with pain medicine.  Difficulty breathing with or without chest  pain  New calf pain especially if only on one side  Sudden, continuing increased vaginal bleeding with or without clots.   Contacts: For questions or concerns you should contact:  Dr. Everitt Amber at [place], NP at [phone]  After Hours:  call [phone] and have the GYN Oncologist paged/contacted

## 2019-12-21 ENCOUNTER — Ambulatory Visit
Admission: RE | Admit: 2019-12-21 | Discharge: 2019-12-21 | Disposition: A | Discharge status: Home / Self Care | Payer: No Typology Code available for payment source | Source: Ambulatory Visit | Attending: Radiation Oncology | Admitting: Radiation Oncology

## 2019-12-21 ENCOUNTER — Encounter: Payer: Self-pay | Admitting: *Deleted

## 2019-12-21 ENCOUNTER — Other Ambulatory Visit: Payer: Self-pay

## 2019-12-21 ENCOUNTER — Other Ambulatory Visit: Payer: Self-pay | Admitting: Gynecologic Oncology

## 2019-12-21 DIAGNOSIS — Z510 Encounter for antineoplastic radiation therapy: Secondary | ICD-10-CM | POA: Diagnosis not present

## 2019-12-21 DIAGNOSIS — C50311 Malignant neoplasm of lower-inner quadrant of right female breast: Secondary | ICD-10-CM

## 2019-12-21 DIAGNOSIS — Z170 Estrogen receptor positive status [ER+]: Secondary | ICD-10-CM

## 2019-12-23 ENCOUNTER — Ambulatory Visit: Payer: 59

## 2019-12-23 ENCOUNTER — Ambulatory Visit: Payer: No Typology Code available for payment source | Attending: General Surgery

## 2019-12-23 ENCOUNTER — Other Ambulatory Visit: Payer: Self-pay

## 2019-12-23 DIAGNOSIS — Z170 Estrogen receptor positive status [ER+]: Secondary | ICD-10-CM | POA: Diagnosis present

## 2019-12-23 DIAGNOSIS — M25611 Stiffness of right shoulder, not elsewhere classified: Secondary | ICD-10-CM | POA: Diagnosis present

## 2019-12-23 DIAGNOSIS — C50311 Malignant neoplasm of lower-inner quadrant of right female breast: Secondary | ICD-10-CM | POA: Diagnosis not present

## 2019-12-23 NOTE — Therapy (Signed)
[place], [place], [address] Phone: [phone]   Fax:  [phone]  Physical Therapy Evaluation  Patient Details  Name: Kimberly Mueller MRN: [id number] Date of Birth: 1985/08/15 Referring Provider (PT): Rolm Bookbinder MD   Encounter Date: 12/23/2019   PT End of Session - 12/23/19 1621    Visit Number 1    Number of Visits 1    PT Start Time 1540    PT Stop Time 1640    PT Time Calculation (min) 60 min    Activity Tolerance Patient tolerated treatment well    Behavior During Therapy [facility] for tasks assessed/performed           Past Medical History:  Diagnosis Date  . Breast cancer ([place])   . Cancer [facility])    breast  . Family history of pancreatic cancer   . Family history of prostate cancer   . GERD (gastroesophageal reflux disease)     Past Surgical History:  Procedure Laterality Date  . BREAST LUMPECTOMY WITH RADIOACTIVE SEED AND SENTINEL LYMPH NODE BIOPSY Right 11/18/2019   Procedure: RIGHT BREAST SEED GUIDED LUMPECTOMY, RIGHT AXILLARY SENTINEL LYMPH NODE BIOSPY;  Surgeon: Rolm Bookbinder, MD;  Location: [place];  Service: General;  Laterality: Right;  GENERAL AND PECTORAL BLOCK  . FOREIGN BODY REMOVAL N/A 11/18/2019   Procedure: NEXPLANON REMOVAL;  Surgeon: Rolm Bookbinder, MD;  Location: [place];  Service: General;  Laterality: N/A;  GENERAL AND PECTORAL BLOCK  . GASTRIC BYPASS    . HERNIA REPAIR    . PORT-A-CATH REMOVAL N/A 11/18/2019   Procedure: REMOVAL PORT-A-CATH;  Surgeon: Rolm Bookbinder, MD;  Location: [place];  Service: General;  Laterality: N/A;  GENERAL AND PECTORAL BLOCK  . PORTACATH PLACEMENT Right 06/09/2019   Procedure: INSERTION PORT-A-CATH WITH ULTRASOUND;  Surgeon: Rolm Bookbinder, MD;  Location: [place];  Service: General;  Laterality: Right;    There were no vitals filed for this  visit.    Subjective Assessment - 12/23/19 1550    Subjective Pt reports that she has been going to gym and doing some exercises with very little weight. She states that she has noticed any restrictions in ROM except some tightness at end range. She reports that her arm feels weak and she just feels kind of weak in general .    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    Currently in Pain? No/denies    Pain Score 0-No pain              [facility] PT Assessment - 12/23/19 0001      Assessment   Medical Diagnosis R breast cancer     Referring Provider (PT) Rolm Bookbinder MD    Onset Date/Surgical Date 11/18/19    Hand Dominance Right    Prior Therapy None      Precautions   Precautions Other (comment)    Precaution Comments cancer       Restrictions   Weight Bearing Restrictions No      Balance Screen   Has the patient fallen in the past 6 months No    Has the patient had a decrease  in activity level because of a fear of falling?  No    Is the patient reluctant to leave their home because of a fear of falling?  No      Home Environment   Living Environment Private residence    Living Arrangements Spouse/significant other    Type of [place] Two level    Additional Comments split level house pt reports no difficulty ascending/descending starirs.       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 Pt has  some soft edema in her R breast without fibrosis       Posture/Postural Control   Posture/Postural Control Postural limitations    Postural Limitations Rounded Shoulders;Forward head      ROM / Strength   AROM / PROM / Strength AROM      AROM   AROM Assessment Site Shoulder    Right/Left Shoulder Right;Left    Right Shoulder Flexion 143 Degrees    Right Shoulder ABduction 151 Degrees    Right Shoulder Internal Rotation 77 Degrees    Right Shoulder External Rotation 85 Degrees    Left Shoulder Flexion 161 Degrees    Left Shoulder ABduction 180 Degrees    Left Shoulder Internal Rotation 67 Degrees    Left Shoulder External Rotation 80 Degrees             LYMPHEDEMA/ONCOLOGY QUESTIONNAIRE - 12/23/19 0001      Type   Cancer Type R breast cancer      Surgeries   Lumpectomy Date 11/18/19    Sentinel Lymph Node Biopsy Date 11/18/19    Number Lymph Nodes Removed 2      Treatment   Active Chemotherapy Treatment Yes    Past Chemotherapy Treatment Yes    Active Radiation Treatment No   Starting on Monday    Past Radiation Treatment No    Current Hormone Treatment Yes    Drug Name Zoladex       What other symptoms do you have   Are you Having Heaviness or Tightness Yes    Are you having Pain No    Are you having pitting edema No    Is it Hard or Difficult finding clothes that fit No    Do you have infections No    Is there Decreased scar mobility Yes      Lymphedema Assessments   Lymphedema Assessments Upper extremities      Right Upper Extremity Lymphedema   15 cm Proximal to Olecranon Process 28 cm    10 cm Proximal to Olecranon Process 27.7 cm    Olecranon Process 22.7 cm    15 cm Proximal to Ulnar Styloid Process 22.4 cm    10 cm Proximal to Ulnar Styloid Process 20 cm    Just Proximal to Ulnar Styloid Process 15 cm    Across Hand at [employer] 17 cm    At [place] of 2nd Digit 5.4 cm      Left Upper Extremity Lymphedema   15 cm Proximal to Olecranon Process  27.5 cm    10 cm Proximal to Olecranon Process 26.4 cm  Olecranon Process 22.7 cm    15 cm Proximal to Ulnar Styloid Process 22 cm    10 cm Proximal to Ulnar Styloid Process 19.8 cm    Just Proximal to Ulnar Styloid Process 14.7 cm    Across Hand at [employer] 16.5 cm    At [place] of 2nd Digit 5.3 cm           L-DEX FLOWSHEETS - 12/23/19 1600      L-DEX LYMPHEDEMA SCREENING   Measurement Type Unilateral    L-DEX MEASUREMENT EXTREMITY Upper Extremity    POSITION  Standing    DOMINANT SIDE Right    At Risk Side Right    BASELINE SCORE (UNILATERAL) -0.5                  Objective measurements completed on examination: See above findings.               PT Education - 12/23/19 1651    Education Details Access Code: [email or web address], pt was taken through exercises with demonstration and return demonstration. She was educated on lymphedema risk now vs. following radiation and discussed lymphedema risk reduction precautions. She was provided with information on ABC class and lymphedema risk reduction habits. She was provided with information on post op exercises and stretching to help gain ROM back to pre-operative. Discussed maintaining activity throughout radiation to help with ROM and side effects within physical therapy scope of practice. Pt was educated on the L-Dex and the purpose including getting measured every 3 months then every 6 months in order to detect and address sub clinical lymphedema.    Person(s) Educated Patient    Methods Explanation;Demonstration    Comprehension Verbalized understanding;Returned demonstration               PT Long Term Goals - 12/23/19 1704      PT LONG TERM GOAL #1   Title Patient will be able to verbalize understanding of pertinent lymphedema risk reduction practices relevant to her diagnosis specifically related to skin care.    Baseline pt was able to verbalize understanding provided with handout    Time 1    Period  Days    Status Achieved      PT LONG TERM GOAL #2   Title Patient will be able to return demonstrate and/or verbalize understanding of the post-op home exercise program related to regaining shoulder range of motion    Baseline pt was able to perform return demonstration    Time 1    Period Days    Status Achieved      PT LONG TERM GOAL #3   Title Patient will be able to verbalize understanding of the importance of attending the postoperative After Breast Cancer Class for further lymphedema risk reduction education and therapeutic exercise.    Baseline pt was provided with information and stated understanding    Time 1    Period Days    Status Achieved      PT LONG TERM GOAL #4   Title Pt will return to be measured by [facility] to assess for sub clinical lymphedema within 3 months.    Baseline pt set up for appointment -0.5 measurement    Time 3    Period Months    Status New    Target Date 03/30/20                  Plan - 12/23/19 1656    Clinical Impression Statement Pt presents  to physical therapy post lumpectomy on 11/18/2019 with 2 lymph node removal. She has finished IV chemotherapy and is going to Herception injections. She is starting radiation on 12/28/2019. Pt was provided with post op exercises for ROM and scapular stabilization due to poor posture and decrease ROM on the R compared ot the L. Her incisions are healing well with no signs of cording. Discussed performing ROM exercises at home and returning to physical therapy if needed due to pt has multiple appointsments secondary to radiation. The patient was assessed using the L-Dex machine today to produce a lymphedema index baseline score. The patient will be reassessed on a regular basis (typically every 3 months) to obtain new L-Dex scores. If the score is > 6.5 points away from his/her baseline score indicating onset of subclinical lymphedema, it will be recommended to wear a compression garment for 4 weeks, 12 hours per  day and then be reassessed. If the score continues to be > 6.5 points from baseline at reassessment, we will initiate lymphedema treatment. Assessing in this manner has a 95% rate of preventing clinically significant lymphedema.    Personal Factors and Comorbidities Comorbidity 1    Comorbidities R lumpectomy with 2 lymph node removal and is starting radiation on 12/28/2019    Stability/Clinical Decision Making Stable/Uncomplicated    Clinical Decision Making Low    Rehab Potential Good    PT Frequency One time visit    PT Treatment/Interventions Therapeutic activities;Therapeutic exercise;Manual techniques    PT Next Visit Plan This is a one time visit.    PT Home Exercise Plan Access Code: [email or web address]    Consulted and Agree with Plan of Care Patient             Patient will benefit from skilled therapeutic intervention in order to improve the following deficits and impairments:  Decreased range of motion, Decreased knowledge of precautions  Visit Diagnosis: Malignant neoplasm of lower-inner quadrant of right breast of female, estrogen receptor positive ([place])  Stiffness of right shoulder, not elsewhere classified     Problem List Patient Active Problem List   Diagnosis Date Noted  . Port-A-Cath in place 07/22/2019  . Genetic testing 06/15/2019  . Family history of prostate cancer   . Family history of pancreatic cancer   . Malignant neoplasm of lower-inner quadrant of right breast of female, estrogen receptor positive ([place]) 05/25/2019    Ander Purpura, PT 12/23/2019, 5:07 PM  [place], [place], [address] Phone: [phone]   Fax:  [phone]  Name: Kimberly Mueller MRN: [id number] Date of Birth: [birth year]

## 2019-12-23 NOTE — Patient Instructions (Addendum)
Access Code: [email or web address] URL: [email or web address] Date: 12/23/2019 Prepared by: Tomma Rakers  Exercises Shoulder Flexion AAROM - 2 x daily - 7 x weekly - 10 reps - 2-3 hold Standing Shoulder Abduction AAROM with Dowel - 2 x daily - 7 x weekly - 10 reps - 2-3 hold Supine Chest Stretch with Elbows Bent - 2 x daily - 7 x weekly - 5 reps - 5 hold Standing Scapular Retraction - 2 x daily - 7 x weekly - 10 reps - 2-3 hold Standing Backward Shoulder Rolls - 2 x daily - 7 x weekly - 10 reps Standing Shoulder Abduction Slides at Wall - 2 x daily - 7 x weekly - 10 reps - 2-3 hold Standing shoulder flexion wall slides - 2 x daily - 7 x weekly - 10 reps - 2-3 hold  Physical Therapy Information for After Breast Cancer Surgery/Treatment:   Lymphedema is a swelling condition that you may be at risk for in your arm if you have lymph nodes removed from the armpit area.  After a sentinel node biopsy, the risk is approximately 5-9% and is higher after an axillary node dissection.  There is treatment available for this condition and it is not life-threatening.  Contact your physician or physical therapist with concerns.  You may begin the 4 shoulder/posture exercises (see additional sheet) when permitted by your physician (typically a week after surgery).  If you have drains, you may need to wait until those are removed before beginning range of motion exercises.  A general recommendation is to not lift your arms above shoulder height until drains are removed.  These exercises should be done to your tolerance and gently.  This is not a "no pain/no gain" type of recovery so listen to your body and stretch into the range of motion that you can tolerate, stopping if you have pain.  If you are having immediate reconstruction, ask your plastic surgeon about doing exercises as he or she may want you to wait.  We encourage you to attend the free one time ABC (After Breast Cancer) class offered by [facility].  You will learn information related to lymphedema risk, prevention and treatment and additional exercises to regain mobility following surgery.  You can call [phone] for more information.  This is offered the 1st and 3rd Monday of each month.  You only attend the class one time.  While undergoing any medical procedure or treatment, try to avoid blood pressure being taken or needle sticks from occurring on the arm on the side of cancer.   This recommendation begins after surgery and continues for the rest of your life.  This may help reduce your risk of getting lymphedema (swelling in your arm).  An excellent resource for those seeking information on lymphedema is the National Lymphedema Network's web site. It can be accessed at [facility].org  If you notice swelling in your hand, arm or breast at any time following surgery (even if it is many years from now), please contact your doctor or physical therapist to discuss this.  Lymphedema can be treated at any time but it is easier for you if it is treated early on.  If you feel like your shoulder motion is not returning to normal in a reasonable amount of time, please contact your surgeon or physical therapist.  [facility] [place], [place], [place] [id number]; [address], [place], [place] [address] ABC CLASS After Breast Cancer Class  After Breast Cancer Class is  a specially designed exercise class to assist you in a safe recover after having breast cancer surgery.  In this class you will learn how to get back to full function whether your drains were just removed or if you had surgery a month ago.  This one-time class is held the 1st and 3rd Monday of every month from 11:00 a.m. until 12:00 noon at the [facility] located at [place], [place] [address]  This class is FREE and space is limited. For more information or to register for the next available class, call [phone]  [phone].  Class Goals   Understand specific stretches to improve the flexibility of you chest and shoulder.  Learn ways to safely strengthen your upper body and improve your posture.  Understand the warning signs of infection and why you may be at risk for an arm infection.  Learn about Lymphedema and prevention.  ** You do not attend this class until after surgery.  Drains must be removed to participate  Patient was instructed today in a home exercise program today for post op shoulder range of motion. These included active assist shoulder flexion in sitting, scapular retraction, wall walking with shoulder abduction, and hands behind head external rotation.  She was encouraged to do these twice a day, holding 3 seconds and repeating 5 times when permitted by her physician.

## 2019-12-24 ENCOUNTER — Other Ambulatory Visit: Payer: Self-pay | Admitting: Adult Health

## 2019-12-24 DIAGNOSIS — Z510 Encounter for antineoplastic radiation therapy: Secondary | ICD-10-CM | POA: Diagnosis not present

## 2019-12-24 NOTE — Progress Notes (Signed)
Pharmacist Chemotherapy Monitoring - Follow Up Assessment    I verify that I have reviewed each item in the below checklist:  . Regimen for the patient is scheduled for the appropriate day and plan matches scheduled date. Marland Kitchen Appropriate non-routine labs are ordered dependent on drug ordered. . If applicable, additional medications reviewed and ordered per protocol based on lifetime cumulative doses and/or treatment regimen.   Plan for follow-up and/or issues identified: No . I-vent associated with next due treatment: Yes . MD and/or nursing notified: No   Kennith Center, Pharm.D., CPP 12/24/2019@3 :04 PM

## 2019-12-28 ENCOUNTER — Ambulatory Visit
Admission: RE | Admit: 2019-12-28 | Discharge: 2019-12-28 | Disposition: A | Discharge status: Home / Self Care | Payer: No Typology Code available for payment source | Source: Ambulatory Visit | Attending: Radiation Oncology | Admitting: Radiation Oncology

## 2019-12-28 ENCOUNTER — Other Ambulatory Visit: Payer: Self-pay

## 2019-12-28 DIAGNOSIS — C50311 Malignant neoplasm of lower-inner quadrant of right female breast: Secondary | ICD-10-CM

## 2019-12-28 DIAGNOSIS — Z170 Estrogen receptor positive status [ER+]: Secondary | ICD-10-CM

## 2019-12-28 DIAGNOSIS — Z510 Encounter for antineoplastic radiation therapy: Secondary | ICD-10-CM | POA: Diagnosis not present

## 2019-12-28 MED ORDER — SONAFINE EX EMUL
1.0000 "application " | Freq: Two times a day (BID) | CUTANEOUS | Status: DC
  Administered 2019-12-28: 1 via TOPICAL

## 2019-12-28 MED ORDER — ALRA NON-METALLIC DEODORANT (RAD-ONC)
1.0000 "application " | Freq: Once | TOPICAL | Status: AC
  Administered 2019-12-28: 1 via TOPICAL

## 2019-12-29 ENCOUNTER — Ambulatory Visit
Admission: RE | Admit: 2019-12-29 | Discharge: 2019-12-29 | Disposition: A | Discharge status: Home / Self Care | Payer: No Typology Code available for payment source | Source: Ambulatory Visit | Attending: Radiation Oncology | Admitting: Radiation Oncology

## 2019-12-29 ENCOUNTER — Other Ambulatory Visit: Payer: Self-pay

## 2019-12-29 DIAGNOSIS — Z510 Encounter for antineoplastic radiation therapy: Secondary | ICD-10-CM | POA: Diagnosis not present

## 2019-12-30 ENCOUNTER — Ambulatory Visit: Payer: 59

## 2019-12-30 ENCOUNTER — Ambulatory Visit: Payer: No Typology Code available for payment source

## 2019-12-30 ENCOUNTER — Inpatient Hospital Stay: Payer: No Typology Code available for payment source

## 2019-12-30 ENCOUNTER — Inpatient Hospital Stay (HOSPITAL_BASED_OUTPATIENT_CLINIC_OR_DEPARTMENT_OTHER): Payer: No Typology Code available for payment source | Admitting: Medical

## 2019-12-30 ENCOUNTER — Other Ambulatory Visit: Payer: Self-pay | Admitting: Medical

## 2019-12-30 ENCOUNTER — Other Ambulatory Visit: Payer: Self-pay | Admitting: Hematology and Oncology

## 2019-12-30 ENCOUNTER — Ambulatory Visit
Admission: RE | Admit: 2019-12-30 | Discharge: 2019-12-30 | Disposition: A | Discharge status: Home / Self Care | Payer: No Typology Code available for payment source | Source: Ambulatory Visit | Attending: Radiation Oncology | Admitting: Radiation Oncology

## 2019-12-30 ENCOUNTER — Other Ambulatory Visit: Payer: Self-pay

## 2019-12-30 VITALS — BP 121/90 | HR 69 | Temp 98.4°F | Resp 18

## 2019-12-30 DIAGNOSIS — Z170 Estrogen receptor positive status [ER+]: Secondary | ICD-10-CM

## 2019-12-30 DIAGNOSIS — F5101 Primary insomnia: Secondary | ICD-10-CM

## 2019-12-30 DIAGNOSIS — C50311 Malignant neoplasm of lower-inner quadrant of right female breast: Secondary | ICD-10-CM

## 2019-12-30 DIAGNOSIS — R5383 Other fatigue: Secondary | ICD-10-CM

## 2019-12-30 DIAGNOSIS — Z510 Encounter for antineoplastic radiation therapy: Secondary | ICD-10-CM | POA: Diagnosis not present

## 2019-12-30 DIAGNOSIS — Z95828 Presence of other vascular implants and grafts: Secondary | ICD-10-CM

## 2019-12-30 MED ORDER — ACETAMINOPHEN 325 MG PO TABS
650.0000 mg | ORAL_TABLET | Freq: Once | ORAL | Status: AC
  Administered 2019-12-30: 650 mg via ORAL

## 2019-12-30 MED ORDER — DIPHENHYDRAMINE HCL 25 MG PO CAPS
ORAL_CAPSULE | ORAL | Status: AC
  Filled 2019-12-30: qty 1

## 2019-12-30 MED ORDER — ZOLPIDEM TARTRATE 10 MG PO TABS: 5.0000 mg | ORAL_TABLET | Freq: Every evening | ORAL | 2 refills | Status: DC | PRN

## 2019-12-30 MED ORDER — GOSERELIN ACETATE 3.6 MG ~~LOC~~ IMPL
DRUG_IMPLANT | SUBCUTANEOUS | Status: AC
  Filled 2019-12-30: qty 3.6

## 2019-12-30 MED ORDER — TRASTUZUMAB-HYALURONIDASE-OYSK 600-10000 MG-UNT/5ML ~~LOC~~ SOLN
600.0000 mg | Freq: Once | SUBCUTANEOUS | Status: AC
  Administered 2019-12-30: 600 mg via SUBCUTANEOUS
  Filled 2019-12-30: qty 5

## 2019-12-30 MED ORDER — GOSERELIN ACETATE 3.6 MG ~~LOC~~ IMPL
3.6000 mg | DRUG_IMPLANT | Freq: Once | SUBCUTANEOUS | Status: AC
  Administered 2019-12-30: 3.6 mg via SUBCUTANEOUS

## 2019-12-30 MED ORDER — ACETAMINOPHEN 325 MG PO TABS
ORAL_TABLET | ORAL | Status: AC
  Filled 2019-12-30: qty 2

## 2019-12-30 MED ORDER — DIPHENHYDRAMINE HCL 25 MG PO CAPS
50.0000 mg | ORAL_CAPSULE | Freq: Once | ORAL | Status: AC
  Administered 2019-12-30: 50 mg via ORAL

## 2019-12-30 NOTE — Patient Instructions (Signed)
[facility] Discharge Instructions for Patients Receiving Chemotherapy  Today you received the following chemotherapy agents Trastuzumab (HERCEPTIN-HYLECTA).  To help prevent nausea and vomiting after your treatment, we encourage you to take your nausea medication as prescribed.  If you develop nausea and vomiting that is not controlled by your nausea medication, call the clinic.   BELOW ARE SYMPTOMS THAT SHOULD BE REPORTED IMMEDIATELY:  *FEVER GREATER THAN 100.5 F  *CHILLS WITH OR WITHOUT FEVER  NAUSEA AND VOMITING THAT IS NOT CONTROLLED WITH YOUR NAUSEA MEDICATION  *UNUSUAL SHORTNESS OF BREATH  *UNUSUAL BRUISING OR BLEEDING  TENDERNESS IN MOUTH AND THROAT WITH OR WITHOUT PRESENCE OF ULCERS  *URINARY PROBLEMS  *BOWEL PROBLEMS  UNUSUAL RASH Items with * indicate a potential emergency and should be followed up as soon as possible.  Feel free to call the clinic should you have any questions or concerns. The clinic phone number is [phone].  Please show the [facility] at check-in to the Emergency Department and triage nurse.  Goserelin injection What is this medicine? GOSERELIN (GOE se rel in) is similar to a hormone found in the body. It lowers the amount of sex hormones that the body makes. Men will have lower testosterone levels and women will have lower estrogen levels while taking this medicine. In men, this medicine is used to treat prostate cancer; the injection is either given once per month or once every 12 weeks. A once per month injection (only) is used to treat women with endometriosis, dysfunctional uterine bleeding, or advanced breast cancer. This medicine may be used for other purposes; ask your health care provider or pharmacist if you have questions. COMMON BRAND NAME(S): Zoladex What should I tell my health care provider before I take this medicine? They need to know if you have any of these conditions:  bone  problems  diabetes  heart disease  history of irregular heartbeat  an unusual or allergic reaction to goserelin, other medicines, foods, dyes, or preservatives  pregnant or trying to get pregnant  breast-feeding How should I use this medicine? This medicine is for injection under the skin. It is given by a health care professional in a hospital or clinic setting. Talk to your pediatrician regarding the use of this medicine in children. Special care may be needed. Overdosage: If you think you have taken too much of this medicine contact a poison control center or emergency room at once. NOTE: This medicine is only for you. Do not share this medicine with others. What if I miss a dose? It is important not to miss your dose. Call your doctor or health care professional if you are unable to keep an appointment. What may interact with this medicine? Do not take this medicine with any of the following medications:  cisapride  dronedarone  pimozide  thioridazine This medicine may also interact with the following medications:  other medicines that prolong the QT interval (an abnormal heart rhythm) This list may not describe all possible interactions. Give your health care provider a list of all the medicines, herbs, non-prescription drugs, or dietary supplements you use. Also tell them if you smoke, drink alcohol, or use illegal drugs. Some items may interact with your medicine. What should I watch for while using this medicine? Visit your doctor or health care provider for regular checks on your progress. Your symptoms may appear to get worse during the first weeks of this therapy. Tell your doctor or healthcare provider if your symptoms do  not start to get better or if they get worse after this time. Your bones may get weaker if you take this medicine for a long time. If you smoke or frequently drink alcohol you may increase your risk of bone loss. A family history of osteoporosis,  chronic use of drugs for seizures (convulsions), or corticosteroids can also increase your risk of bone loss. Talk to your doctor about how to keep your bones strong. This medicine should stop regular monthly menstruation in women. Tell your doctor if you continue to menstruate. Women should not become pregnant while taking this medicine or for 12 weeks after stopping this medicine. Women should inform their doctor if they wish to become pregnant or think they might be pregnant. There is a potential for serious side effects to an unborn child. Talk to your health care professional or pharmacist for more information. Do not breast-feed an infant while taking this medicine. Men should inform their doctors if they wish to father a child. This medicine may lower sperm counts. Talk to your health care professional or pharmacist for more information. This medicine may increase blood sugar. Ask your healthcare provider if changes in diet or medicines are needed if you have diabetes. What side effects may I notice from receiving this medicine? Side effects that you should report to your doctor or health care professional as soon as possible:  allergic reactions like skin rash, itching or hives, swelling of the face, lips, or tongue  bone pain  breathing problems  changes in vision  chest pain  feeling faint or lightheaded, falls  fever, chills  pain, swelling, warmth in the leg  pain, tingling, numbness in the hands or feet  signs and symptoms of high blood sugar such as being more thirsty or hungry or having to urinate more than normal. You may also feel very tired or have blurry vision  signs and symptoms of low blood pressure like dizziness; feeling faint or lightheaded, falls; unusually weak or tired  stomach pain  swelling of the ankles, feet, hands  trouble passing urine or change in the amount of urine  unusually high or low blood pressure  unusually weak or tired Side effects  that usually do not require medical attention (report to your doctor or health care professional if they continue or are bothersome):  change in sex drive or performance  changes in breast size in both males and females  changes in emotions or moods  headache  hot flashes  irritation at site where injected  loss of appetite  skin problems like acne, dry skin  vaginal dryness This list may not describe all possible side effects. Call your doctor for medical advice about side effects. You may report side effects to FDA at 1-800-FDA-1088. Where should I keep my medicine? This drug is given in a hospital or clinic and will not be stored at home. NOTE: This sheet is a summary. It may not cover all possible information. If you have questions about this medicine, talk to your doctor, pharmacist, or health care provider.  2020 Elsevier/Gold Standard (2018-10-13 14:05:56)

## 2019-12-30 NOTE — Progress Notes (Signed)
Nutrition Follow-up:  Message received from chemo nurse and nurse navigator regarding continued weight loss.  RD went up to infusion room but patient already discharged.    Called patient and spoke briefly via phone.  Reports appetite is better although having issues with constipation, fatigue and insomnia.  States that husband got her some higher calorie shakes to try.  Thinks she is only eating about 800 calories.      Medications: reviewed  Labs: reviewed  Anthropometrics:   Weight 123 lb today decreased from 136 lb  in March 2021.     Estimated Energy Needs  Kcals: 1700-1900 Protein: 85-95 g Fluid: > 1.7 L  NUTRITION DIAGNOSIS: Unintentional weight loss continue   INTERVENTION:  Stressed importance of increasing calories and protein to prevent further weight loss.  LIBERALIZE diet! Recommend patient keep daily food log with calories and protein.   Encouraged patient to switch to higher calorie shakes if able to tolerate (350 calorie or higher) Continue bariatric MVI plus calcium 500 mg TID Recommend checking thiamine, Vit b 12, folate, iron, Vit D, A, E, K, zinc and copper with continued weight loss and s/p bariatric surgery. Contact information provided    MONITORING, EVALUATION, GOAL: weight trends, intake   NEXT VISIT: July 14 during infusion  Antion Andres B. Zenia Resides, [place], [place] Registered Dietitian [phone] (pager)

## 2019-12-31 ENCOUNTER — Ambulatory Visit
Admission: RE | Admit: 2019-12-31 | Discharge: 2019-12-31 | Disposition: A | Discharge status: Home / Self Care | Payer: No Typology Code available for payment source | Source: Ambulatory Visit | Attending: Radiation Oncology | Admitting: Radiation Oncology

## 2019-12-31 DIAGNOSIS — Z510 Encounter for antineoplastic radiation therapy: Secondary | ICD-10-CM | POA: Diagnosis not present

## 2020-01-01 ENCOUNTER — Other Ambulatory Visit: Payer: Self-pay

## 2020-01-01 ENCOUNTER — Ambulatory Visit
Admission: RE | Admit: 2020-01-01 | Discharge: 2020-01-01 | Disposition: A | Discharge status: Home / Self Care | Payer: No Typology Code available for payment source | Source: Ambulatory Visit | Attending: Radiation Oncology | Admitting: Radiation Oncology

## 2020-01-01 DIAGNOSIS — Z510 Encounter for antineoplastic radiation therapy: Secondary | ICD-10-CM | POA: Diagnosis not present

## 2020-01-01 NOTE — Progress Notes (Signed)
Kimberly Mueller was seen in the infusion room today as she was receiving chemotherapy.  She reported that she discontinued gabapentin several weeks ago.  She was only taking 100 mg at bedtime.  Since that time she has noted that she is having difficulty sleeping.  She asked for a sleep aid today.  She also reports that she is having splitting of her nails and is concerned that her vitamin level may be changed.  She was given a prescription for Ambien 10 mg with instructions to use 1/2 tablet per night with 30 dispensed with refills.  Also a vitamin D level was ordered.  Sandi Mealy, MHS, PA-C Physician Assistant

## 2020-01-04 ENCOUNTER — Ambulatory Visit
Admission: RE | Admit: 2020-01-04 | Discharge: 2020-01-04 | Disposition: A | Discharge status: Home / Self Care | Payer: No Typology Code available for payment source | Source: Ambulatory Visit | Attending: Radiation Oncology | Admitting: Radiation Oncology

## 2020-01-04 ENCOUNTER — Ambulatory Visit: Payer: 59 | Admitting: Hematology and Oncology

## 2020-01-04 ENCOUNTER — Other Ambulatory Visit: Payer: Self-pay

## 2020-01-04 ENCOUNTER — Other Ambulatory Visit: Payer: 59

## 2020-01-04 DIAGNOSIS — Z510 Encounter for antineoplastic radiation therapy: Secondary | ICD-10-CM | POA: Diagnosis not present

## 2020-01-05 ENCOUNTER — Ambulatory Visit
Admission: RE | Admit: 2020-01-05 | Discharge: 2020-01-05 | Disposition: A | Discharge status: Home / Self Care | Payer: No Typology Code available for payment source | Source: Ambulatory Visit | Attending: Radiation Oncology | Admitting: Radiation Oncology

## 2020-01-05 ENCOUNTER — Other Ambulatory Visit: Payer: Self-pay

## 2020-01-05 DIAGNOSIS — Z510 Encounter for antineoplastic radiation therapy: Secondary | ICD-10-CM | POA: Diagnosis not present

## 2020-01-06 ENCOUNTER — Ambulatory Visit: Payer: No Typology Code available for payment source

## 2020-01-06 ENCOUNTER — Other Ambulatory Visit: Payer: Self-pay

## 2020-01-06 ENCOUNTER — Ambulatory Visit
Admission: RE | Admit: 2020-01-06 | Discharge: 2020-01-06 | Disposition: A | Discharge status: Home / Self Care | Payer: No Typology Code available for payment source | Source: Ambulatory Visit | Attending: Radiation Oncology | Admitting: Radiation Oncology

## 2020-01-06 DIAGNOSIS — Z510 Encounter for antineoplastic radiation therapy: Secondary | ICD-10-CM | POA: Diagnosis not present

## 2020-01-07 ENCOUNTER — Ambulatory Visit
Admission: RE | Admit: 2020-01-07 | Discharge: 2020-01-07 | Disposition: A | Discharge status: Home / Self Care | Payer: No Typology Code available for payment source | Source: Ambulatory Visit | Attending: Radiation Oncology | Admitting: Radiation Oncology

## 2020-01-07 ENCOUNTER — Other Ambulatory Visit: Payer: Self-pay

## 2020-01-07 DIAGNOSIS — K219 Gastro-esophageal reflux disease without esophagitis: Secondary | ICD-10-CM | POA: Diagnosis not present

## 2020-01-07 DIAGNOSIS — C50311 Malignant neoplasm of lower-inner quadrant of right female breast: Secondary | ICD-10-CM | POA: Insufficient documentation

## 2020-01-07 DIAGNOSIS — Z9884 Bariatric surgery status: Secondary | ICD-10-CM | POA: Insufficient documentation

## 2020-01-07 DIAGNOSIS — Z5111 Encounter for antineoplastic chemotherapy: Secondary | ICD-10-CM | POA: Insufficient documentation

## 2020-01-07 DIAGNOSIS — R251 Tremor, unspecified: Secondary | ICD-10-CM | POA: Diagnosis not present

## 2020-01-07 DIAGNOSIS — Z87891 Personal history of nicotine dependence: Secondary | ICD-10-CM | POA: Insufficient documentation

## 2020-01-07 DIAGNOSIS — Z170 Estrogen receptor positive status [ER+]: Secondary | ICD-10-CM | POA: Insufficient documentation

## 2020-01-07 DIAGNOSIS — Z79899 Other long term (current) drug therapy: Secondary | ICD-10-CM | POA: Insufficient documentation

## 2020-01-07 DIAGNOSIS — Z8042 Family history of malignant neoplasm of prostate: Secondary | ICD-10-CM | POA: Insufficient documentation

## 2020-01-07 DIAGNOSIS — J029 Acute pharyngitis, unspecified: Secondary | ICD-10-CM | POA: Diagnosis not present

## 2020-01-07 DIAGNOSIS — Z8041 Family history of malignant neoplasm of ovary: Secondary | ICD-10-CM | POA: Insufficient documentation

## 2020-01-07 DIAGNOSIS — Z6823 Body mass index (BMI) 23.0-23.9, adult: Secondary | ICD-10-CM | POA: Insufficient documentation

## 2020-01-07 DIAGNOSIS — Z510 Encounter for antineoplastic radiation therapy: Secondary | ICD-10-CM | POA: Insufficient documentation

## 2020-01-07 DIAGNOSIS — Z800 Family history of malignant neoplasm of digestive organs: Secondary | ICD-10-CM | POA: Insufficient documentation

## 2020-01-07 DIAGNOSIS — F418 Other specified anxiety disorders: Secondary | ICD-10-CM | POA: Insufficient documentation

## 2020-01-08 ENCOUNTER — Ambulatory Visit
Admission: RE | Admit: 2020-01-08 | Discharge: 2020-01-08 | Disposition: A | Discharge status: Home / Self Care | Payer: No Typology Code available for payment source | Source: Ambulatory Visit | Attending: Radiation Oncology | Admitting: Radiation Oncology

## 2020-01-08 DIAGNOSIS — Z5111 Encounter for antineoplastic chemotherapy: Secondary | ICD-10-CM | POA: Diagnosis not present

## 2020-01-12 ENCOUNTER — Ambulatory Visit
Admission: RE | Admit: 2020-01-12 | Discharge: 2020-01-12 | Disposition: A | Discharge status: Home / Self Care | Payer: No Typology Code available for payment source | Source: Ambulatory Visit | Attending: Radiation Oncology | Admitting: Radiation Oncology

## 2020-01-12 DIAGNOSIS — Z5111 Encounter for antineoplastic chemotherapy: Secondary | ICD-10-CM | POA: Diagnosis not present

## 2020-01-13 ENCOUNTER — Ambulatory Visit
Admission: RE | Admit: 2020-01-13 | Discharge: 2020-01-13 | Disposition: A | Discharge status: Home / Self Care | Payer: No Typology Code available for payment source | Source: Ambulatory Visit | Attending: Radiation Oncology | Admitting: Radiation Oncology

## 2020-01-13 DIAGNOSIS — Z5111 Encounter for antineoplastic chemotherapy: Secondary | ICD-10-CM | POA: Diagnosis not present

## 2020-01-14 ENCOUNTER — Ambulatory Visit
Admission: RE | Admit: 2020-01-14 | Discharge: 2020-01-14 | Disposition: A | Discharge status: Home / Self Care | Payer: No Typology Code available for payment source | Source: Ambulatory Visit | Attending: Radiation Oncology | Admitting: Radiation Oncology

## 2020-01-14 DIAGNOSIS — Z5111 Encounter for antineoplastic chemotherapy: Secondary | ICD-10-CM | POA: Diagnosis not present

## 2020-01-15 ENCOUNTER — Ambulatory Visit
Admission: RE | Admit: 2020-01-15 | Discharge: 2020-01-15 | Disposition: A | Discharge status: Home / Self Care | Payer: No Typology Code available for payment source | Source: Ambulatory Visit | Attending: Radiation Oncology | Admitting: Radiation Oncology

## 2020-01-15 DIAGNOSIS — Z5111 Encounter for antineoplastic chemotherapy: Secondary | ICD-10-CM | POA: Diagnosis not present

## 2020-01-18 ENCOUNTER — Other Ambulatory Visit: Payer: Self-pay

## 2020-01-18 ENCOUNTER — Ambulatory Visit
Admission: RE | Admit: 2020-01-18 | Discharge: 2020-01-18 | Disposition: A | Discharge status: Home / Self Care | Payer: No Typology Code available for payment source | Source: Ambulatory Visit | Attending: Radiation Oncology | Admitting: Radiation Oncology

## 2020-01-18 DIAGNOSIS — Z5111 Encounter for antineoplastic chemotherapy: Secondary | ICD-10-CM | POA: Diagnosis not present

## 2020-01-19 ENCOUNTER — Other Ambulatory Visit: Payer: Self-pay

## 2020-01-19 ENCOUNTER — Ambulatory Visit
Admission: RE | Admit: 2020-01-19 | Discharge: 2020-01-19 | Disposition: A | Discharge status: Home / Self Care | Payer: No Typology Code available for payment source | Source: Ambulatory Visit | Attending: Radiation Oncology | Admitting: Radiation Oncology

## 2020-01-19 DIAGNOSIS — Z170 Estrogen receptor positive status [ER+]: Secondary | ICD-10-CM

## 2020-01-19 DIAGNOSIS — Z5111 Encounter for antineoplastic chemotherapy: Secondary | ICD-10-CM | POA: Diagnosis not present

## 2020-01-19 DIAGNOSIS — C50311 Malignant neoplasm of lower-inner quadrant of right female breast: Secondary | ICD-10-CM

## 2020-01-19 MED ORDER — SONAFINE EX EMUL
1.0000 "application " | Freq: Two times a day (BID) | CUTANEOUS | Status: DC
  Administered 2020-01-19: 1 via TOPICAL

## 2020-01-19 NOTE — Progress Notes (Signed)
[facility] Follow up:    Jilda Panda, MD [address] [place] [address]   DIAGNOSIS: Cancer Staging Malignant neoplasm of lower-inner quadrant of right breast of female, estrogen receptor positive ([place]) Staging form: Breast, AJCC 8th Edition - Clinical stage from 05/27/2019: Stage IIA (cT2, cN0, cM0, G3, ER+, PR+, HER2: Equivocal) - Unsigned - Pathologic stage from 11/18/2019: No Stage Recommended (ypT1c, pN0, cM0, G3, ER+, PR+, HER2+) - Unsigned   SUMMARY OF ONCOLOGIC HISTORY: Oncology History  Malignant neoplasm of lower-inner quadrant of right breast of female, estrogen receptor positive ([place])  05/25/2019 Initial Diagnosis   Patient palpated a right breast lump for two years. Diagnostic mammogram showed a 4.5cm right breast mass at the 6 o'clock position involving the overlying skin, 2 benign appearing left breast mass at the 10 o'clock position measuring 2.4cm and 2.1cm, and no axillary adenopathy. Biopsy showed: in the left breast, fibroadenoma and no evidence of malignancy, and in the right breast, IDC with DCIS, grade 3.  ER 70%, PR 30%, Ki-67 40%, HER-2 negative by [facility]    06/10/2019 - 11/03/2019 Chemotherapy   The patient had DOXOrubicin (ADRIAMYCIN) chemo injection 118 mg, 60 mg/m2 = 118 mg, Intravenous,  Once, 4 of 4 cycles Dose modification: 50 mg/m2 (original dose 60 mg/m2, Cycle 2, Reason: Dose not tolerated) Administration: 118 mg (06/10/2019), 98 mg (06/24/2019), 98 mg (07/08/2019), 98 mg (07/22/2019) palonosetron (ALOXI) injection 0.25 mg, 0.25 mg, Intravenous,  Once, 4 of 4 cycles Administration: 0.25 mg (06/10/2019), 0.25 mg (06/24/2019), 0.25 mg (07/08/2019), 0.25 mg (07/22/2019) pegfilgrastim-cbqv (UDENYCA) injection 6 mg, 6 mg, Subcutaneous, Once, 4 of 4 cycles Administration: 6 mg (06/12/2019), 6 mg (06/26/2019), 6 mg (07/10/2019), 6 mg (07/24/2019) cyclophosphamide (CYTOXAN) 1,180 mg in sodium chloride 0.9 % 250 mL chemo infusion, 600 mg/m2 =  1,180 mg, Intravenous,  Once, 4 of 4 cycles Dose modification: 500 mg/m2 (original dose 600 mg/m2, Cycle 2, Reason: Dose not tolerated) Administration: 1,180 mg (06/10/2019), 980 mg (06/24/2019), 980 mg (07/08/2019), 980 mg (07/22/2019) PACLitaxel (TAXOL) 156 mg in sodium chloride 0.9 % 250 mL chemo infusion (</= 12m/m2), 80 mg/m2 = 156 mg, Intravenous,  Once, 12 of 12 cycles Dose modification: 65 mg/m2 (original dose 80 mg/m2, Cycle 11, Reason: Provider Judgment), 50 mg/m2 (original dose 80 mg/m2, Cycle 12, Reason: Dose not tolerated) Administration: 156 mg (08/12/2019), 156 mg (08/19/2019), 138 mg (08/26/2019), 138 mg (09/02/2019), 138 mg (09/09/2019), 138 mg (09/16/2019), 114 mg (09/23/2019), 90 mg (09/30/2019), 90 mg (10/07/2019), 90 mg (10/14/2019), 90 mg (10/21/2019), 90 mg (10/28/2019) fosaprepitant (EMEND) 150 mg, dexamethasone (DECADRON) 12 mg in sodium chloride 0.9 % 145 mL IVPB, , Intravenous,  Once, 4 of 4 cycles Administration:  (06/10/2019),  (06/24/2019),  (07/08/2019),  (07/22/2019)  for chemotherapy treatment.     Genetic Testing   No pathogenic variants identified. VUS in DTilledacalled c.2378A>G identified on the [facility] Common Hereditary Cancers Panel. The report date is 06/15/2019.  The Common Hereditary Cancers Panel offered by [facility] includes sequencing and/or deletion duplication testing of the following 48 genes: APC, ATM, AXIN2, BARD1, BMPR1A, BRCA1, BRCA2, BRIP1, CDH1, CDKN2A (p14ARF), CDKN2A (p16INK4a), CKD4, CHEK2, CTNNA1, DICER1, EPCAM (Deletion/duplication testing only), GREM1 (promoter region deletion/duplication testing only), KIT, MEN1, MLH1, MSH2, MSH3, MSH6, MUTYH, NBN, NF1, NHTL1, PALB2, PDGFRA, PMS2, POLD1, POLE, PTEN, RAD50, RAD51C, RAD51D, RNF43, SDHB, SDHC, SDHD, SMAD4, SMARCA4. STK11, TP53, TSC1, TSC2, and VHL.  The following genes were evaluated for sequence changes only: SDHA and HOXB13 c.251G>A variant only.  11/18/2019 Surgery   Right lumpectomy Donne Hazel): IDC, grade 3,  1.5cm, with grade 2-3 DCIS, 2 right axillary lymph nodes negative. FISH on pathology revealed HER-2 positivity.   12/09/2019 -  Chemotherapy   The patient had trastuzumab-hyaluronidase-oysk (HERCEPTIN HYLECTA) 600-10000 MG-UNT/5ML chemo SQ injection 600 mg, 600 mg, Subcutaneous,  Once, 3 of 17 cycles Administration: 600 mg (12/09/2019), 600 mg (12/30/2019), 600 mg (01/20/2020)  for chemotherapy treatment.      CURRENT THERAPY: Zoladex every 4 weeks; Herceptin  INTERVAL HISTORY: [name] 34 y.o. female returns for evaluation of her triple positive breast cancer.  She is having an increase in anxiety and depression that she has noted.  She feels overwhelmed, occasional tremors and nervousness, decreased ability to focus.  She is working regularly and has difficulty staying on task.  Her husband is in the [employer] and they are preparing to move, which has increased this amount of stress.  She has increased the Trintellix, with no improvement.   Kimberly Mueller receives Herceptin subcutaneously every 3 weeks and is tolerating this well.  She is scheduled for BSO on 7/31.  She receives Zoladex every 4 weeks, and has been tolerating this well.  She has been having difficulty sleeping and takes ambien PRN which helps.    Her most recent echocardiogram was completed on 12/16/2019 and showed and EF of 55-60%.     She is due to see [name] this afternoon about her weight loss.  She has had gastric bypass, in a different state and is f/u with her on her diet, weight, and needs considering her weight decreases.     Patient Active Problem List   Diagnosis Date Noted   Port-A-Cath in place 07/22/2019   Genetic testing 06/15/2019   Family history of prostate cancer    Family history of pancreatic cancer    Malignant neoplasm of lower-inner quadrant of right breast of female, estrogen receptor positive ([place]) 05/25/2019    is allergic to adhesive [tape].  MEDICAL HISTORY: Past Medical History:  Diagnosis  Date   Breast cancer ([place])    Cancer ([place])    breast   Family history of pancreatic cancer    Family history of prostate cancer    GERD (gastroesophageal reflux disease)     SURGICAL HISTORY: Past Surgical History:  Procedure Laterality Date   BREAST LUMPECTOMY WITH RADIOACTIVE SEED AND SENTINEL LYMPH NODE BIOPSY Right 11/18/2019   Procedure: RIGHT BREAST SEED GUIDED LUMPECTOMY, RIGHT AXILLARY SENTINEL LYMPH NODE BIOSPY;  Surgeon: Rolm Bookbinder, MD;  Location: [facility];  Service: General;  Laterality: Right;  GENERAL AND PECTORAL BLOCK   FOREIGN BODY REMOVAL N/A 11/18/2019   Procedure: NEXPLANON REMOVAL;  Surgeon: Rolm Bookbinder, MD;  Location: [facility];  Service: General;  Laterality: N/A;  GENERAL AND PECTORAL BLOCK   GASTRIC BYPASS     HERNIA REPAIR     PORT-A-CATH REMOVAL N/A 11/18/2019   Procedure: REMOVAL PORT-A-CATH;  Surgeon: Rolm Bookbinder, MD;  Location: [facility];  Service: General;  Laterality: N/A;  GENERAL AND PECTORAL BLOCK   PORTACATH PLACEMENT Right 06/09/2019   Procedure: INSERTION PORT-A-CATH WITH ULTRASOUND;  Surgeon: Rolm Bookbinder, MD;  Location: [facility];  Service: General;  Laterality: Right;    SOCIAL HISTORY: Social History   Socioeconomic History   Marital status: Married    Spouse name: Not on file   Number of children: Not on file   Years of education: Not on file   Highest education  level: Not on file  Occupational History   Not on file  Tobacco Use   Smoking status: Former Smoker   Smokeless tobacco: Never Used  Substance and Sexual Activity   Alcohol use: Never   Drug use: Never   Sexual activity: Not on file  Other Topics Concern   Not on file  Social History Narrative   Not on file   Social Determinants of Health   Financial Resource Strain:    Difficulty of Paying Living Expenses:   Food Insecurity:    Worried About Ship broker in the Last Year:    Arboriculturist in the Last Year:   Transportation Needs:    Film/video editor (Medical):    Lack of Transportation (Non-Medical):   Physical Activity:    Days of Exercise per Week:    Minutes of Exercise per Session:   Stress:    Feeling of Stress :   Social Connections:    Frequency of Communication with Friends and Family:    Frequency of Social Gatherings with Friends and Family:    Attends Religious Services:    Active Member of Clubs or Organizations:    Attends Music therapist:    Marital Status:   Intimate Partner Violence:    Fear of Current or Ex-Partner:    Emotionally Abused:    Physically Abused:    Sexually Abused:     FAMILY HISTORY: Family History  Problem Relation Age of Onset   Prostate cancer Father    Pancreatic cancer Paternal Grandmother     Review of Systems  Constitutional: Negative for appetite change, chills, fatigue, fever and unexpected weight change.  HENT:   Negative for hearing loss, lump/mass, sore throat and trouble swallowing.   Eyes: Negative for eye problems and icterus.  Respiratory: Negative for chest tightness, cough and shortness of breath.   Cardiovascular: Negative for chest pain, leg swelling and palpitations.  Gastrointestinal: Negative for abdominal distention, abdominal pain, constipation, diarrhea, nausea and vomiting.  Endocrine: Negative for hot flashes.  Genitourinary: Negative for difficulty urinating.   Musculoskeletal: Negative for arthralgias.  Skin: Negative for itching and rash.  Neurological: Negative for dizziness, extremity weakness, headaches and numbness.  Hematological: Negative for adenopathy. Does not bruise/bleed easily.  Psychiatric/Behavioral: Positive for decreased concentration and depression. Negative for sleep disturbance and suicidal ideas. The patient is nervous/anxious.       PHYSICAL EXAMINATION  ECOG PERFORMANCE STATUS: 1 -  Symptomatic but completely ambulatory  Vitals:   01/20/20 0902  BP: 117/90  Pulse: 88  Resp: 17  Temp: 98.5 F (36.9 C)  SpO2: 100%    Physical Exam Constitutional:      General: She is not in acute distress.    Appearance: Normal appearance. She is not toxic-appearing.  HENT:     Head: Normocephalic and atraumatic.  Eyes:     General: No scleral icterus. Cardiovascular:     Rate and Rhythm: Normal rate and regular rhythm.     Pulses: Normal pulses.     Heart sounds: Normal heart sounds.  Pulmonary:     Effort: Pulmonary effort is normal.     Breath sounds: Normal breath sounds.     Comments: Right breast inspected only, undergoing radiation therapy, mild swelling and redness to breast, no skin breakdown, left breast benign Abdominal:     General: Abdomen is flat. Bowel sounds are normal. There is no distension.     Palpations: Abdomen is soft.  Tenderness: There is no abdominal tenderness.  Musculoskeletal:        General: No swelling.     Cervical back: Neck supple.  Lymphadenopathy:     Cervical: No cervical adenopathy.  Neurological:     General: No focal deficit present.     Mental Status: She is alert.  Psychiatric:        Mood and Affect: Mood normal.        Behavior: Behavior normal.     LABORATORY DATA:  CBC    Component Value Date/Time   WBC 2.8 (L) 12/09/2019 0745   WBC 3.3 (L) 06/22/2019 1135   RBC 4.19 12/09/2019 0745   HGB 12.7 12/09/2019 0745   HCT 38.5 12/09/2019 0745   PLT 244 12/09/2019 0745   MCV 91.9 12/09/2019 0745   MCH 30.3 12/09/2019 0745   MCHC 33.0 12/09/2019 0745   RDW 12.7 12/09/2019 0745   LYMPHSABS 0.8 12/09/2019 0745   MONOABS 0.3 12/09/2019 0745   EOSABS 0.2 12/09/2019 0745   BASOSABS 0.0 12/09/2019 0745    CMP     Component Value Date/Time   NA 142 12/09/2019 0745   K 3.6 12/09/2019 0745   CL 107 12/09/2019 0745   CO2 21 (L) 12/09/2019 0745   GLUCOSE 83 12/09/2019 0745   BUN 10 12/09/2019 0745   CREATININE  0.70 12/09/2019 0745   CALCIUM 10.1 12/09/2019 0745   PROT 7.1 12/09/2019 0745   ALBUMIN 4.2 12/09/2019 0745   AST 28 12/09/2019 0745   ALT 36 12/09/2019 0745   ALKPHOS 71 12/09/2019 0745   BILITOT 0.8 12/09/2019 0745   GFRNONAA >60 12/09/2019 0745   GFRAA >60 12/09/2019 0745        ASSESSMENT and THERAPY PLAN:   Malignant neoplasm of lower-inner quadrant of right breast of female, estrogen receptor positive ([place]) 05/15/2019:Patient palpated a right breast lump for two years. Diagnostic mammogram showed a 4.5cm right breast mass at the 6 o'clock position involving the overlying skin, 2 benign appearing left breast mass at the 10 o'clock position measuring 2.4cm and 2.1cm, and no axillary adenopathy. 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 Maintenance Herceptin subcutaneously every three weeks x 1 year starting 12/09/2019 3.Adjuvant radiation beginning 12/31/2019 4.Follow-up adjuvant antiestrogen therapyBSO scheduled 7/31, currently on Zoladex, to start Anastrozole mid 02/2020 -----------------------------------------------------------------------------------------------------------------------------------------------  Herceptin:  Echo on 12/16/2019 EF: 55-60% Kimberly Mueller continues on therapy with every three week Herceptin subcutaneously with good tolerance.  Weight loss: following with [name].  She is losing more weight ? Stress  related.  I have ordered lab testing with vitamin levels to evaluate for any deficiencies secondary to her gastric bypass that she had previously, per [name]'s recommendation.  She is going to call the patient and discuss her recommendations, as Kimberly Mueller has previously confided in her that she is eating 800 calories per day.  We will get these labs completed prior to her BSO, so we can work on any deficiencies she may be experiencing prior to surgery.  She is tolerating adjuvant radiation well.  She has a couple of weeks left.    I sent in Anastrozole for her to take starting mid August.  She and I reviewed the risks and benefits of this treatment, and she understands these.  I also placed a bone density order for her to undergo at the breast center anytime in the next few weeks.  We discussed optimizing her bone health with calcium, vitamin D, and weight bearing exercises.    Kimberly Mueller is increasingly stressed.  She is going to taper off of the Trilentix and once tapered she will start the Effexor.  I also am referring her to our social workers.    We will see Kimberly Mueller back in 6 weeks for labs, f/u and her treatment.  She will continue to receive the Herceptin every 3 weeks.   She knows to call for any questions that may arise between now and her next appointment.  We are happy to see her sooner if needed.    Orders Placed This Encounter  Procedures   DG Bone Density    Standing Status:   Future    Standing Expiration Date:   01/19/2021    Order Specific Question:   Reason for Exam (SYMPTOM  OR DIAGNOSIS REQUIRED)    Answer:   estrogen deficiency    Order Specific Question:   Is the patient pregnant?    Answer:   No    Order Specific Question:   Preferred imaging location?    Answer[facility]   [facility]   CBC with Differential ([place] Only)    Standing Status:   Standing    Number of Occurrences:   1    Standing Expiration Date:   01/19/2021   CMP ([place] only)    Standing  Status:   Standing    Number of Occurrences:   1    Standing Expiration Date:   01/19/2021   Ferritin    Standing Status:   Standing    Number of Occurrences:   1    Standing Expiration Date:   01/19/2021   Iron and TIBC    Standing Status:   Standing    Number of Occurrences:   1    Standing Expiration Date:   01/19/2021   Vitamin D 25 hydroxy    Standing Status:   Standing    Number of Occurrences:   1    Standing Expiration Date:   01/19/2021   Vitamin B12    Standing Status:   Standing    Number of Occurrences:   1    Standing Expiration Date:   01/19/2021   Folate, Serum    Standing Status:   Standing    Number of Occurrences:   1    Standing Expiration Date:   01/19/2021   Vitamin K1, Serum    Standing Status:   Standing    Number of Occurrences:   1    Standing Expiration Date:   01/19/2021   Zinc    Standing Status:   Standing    Number of Occurrences:   1    Standing Expiration Date:   01/19/2021   Vitamin A    Standing Status:   Standing    Number of Occurrences:   1    Standing Expiration Date:   01/19/2021   Vitamin B1    Standing Status:   Standing    Number of Occurrences:   1    Standing Expiration  Date:   01/19/2021   Vitamin B6    Standing Status:   Standing    Number of Occurrences:   1    Standing Expiration Date:   01/19/2021   Vitamin C    Standing Status:   Standing    Number of Occurrences:   1    Standing Expiration Date:   01/19/2021   Vitamin E    Standing Status:   Standing    Number of Occurrences:   1    Standing Expiration Date:   01/19/2021   Copper, serum    Standing Status:   Standing    Number of Occurrences:   1    Standing Expiration Date:   01/19/2021    Total encounter time: 30 minutes*  Wilber Bihari, NP 01/20/20 1:18 PM Medical Oncology and Hematology [facility] [place], [place] [address] Tel. [phone]    Fax. [phone]  *Total Encounter Time as defined by the Centers for  Medicare and Medicaid Services includes, in addition to the face-to-face time of a patient visit (documented in the note above) non-face-to-face time: obtaining and reviewing outside history, ordering and reviewing medications, tests or procedures, care coordination (communications with other health care professionals or caregivers) and documentation in the medical record.

## 2020-01-19 NOTE — Assessment & Plan Note (Addendum)
05/15/2019:Patient palpated a right breast lump for two years. Diagnostic mammogram showed a 4.5cm right breast mass at the 6 o'clock position involving the overlying skin, 2 benign appearing left breast mass at the 10 o'clock position measuring 2.4cm and 2.1cm, and no axillary adenopathy. 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 Maintenance Herceptin subcutaneously every three weeks x 1 year starting 12/09/2019 3.Adjuvant radiation beginning 12/31/2019 4.Follow-up adjuvant antiestrogen therapyBSO scheduled 7/31, currently on Zoladex, to start Anastrozole mid 02/2020 -----------------------------------------------------------------------------------------------------------------------------------------------  Herceptin:  Echo on 12/16/2019 EF: 55-60% Kimberly Mueller continues on therapy with every three week Herceptin subcutaneously with good tolerance.  Weight loss: following with [name].  She is losing more weight ? Stress related.  I have ordered lab testing with vitamin levels to evaluate for any deficiencies secondary to her gastric bypass that she had previously, per [name]'s recommendation.  She is going to call the patient and discuss her recommendations, as Kimberly Mueller has previously confided in her that she is eating 800 calories per day.  We will get these labs completed prior to her BSO, so we can work on any deficiencies she may be experiencing  prior to surgery.  She is tolerating adjuvant radiation well.  She has a couple of weeks left.    I sent in Anastrozole for her to take starting mid August.  She and I reviewed the risks and benefits of this treatment, and she understands these.  I also placed a bone density order for her to undergo at the breast center anytime in the next few weeks.  We discussed optimizing her bone health with calcium, vitamin D, and weight bearing exercises.    Kimberly Mueller is increasingly stressed.  She is going to taper off of the Trilentix and once tapered she will start the Effexor.  I also am referring her to our social workers.    We will see Kimberly Mueller back in 6 weeks for labs, f/u and her treatment.  She will continue to receive the Herceptin every 3 weeks.   She knows to call for any questions that may arise between now and her next appointment.  We are happy to see her sooner if needed.

## 2020-01-20 ENCOUNTER — Inpatient Hospital Stay: Payer: No Typology Code available for payment source

## 2020-01-20 ENCOUNTER — Telehealth: Payer: Self-pay

## 2020-01-20 ENCOUNTER — Ambulatory Visit: Payer: 59 | Admitting: Hematology and Oncology

## 2020-01-20 ENCOUNTER — Inpatient Hospital Stay: Payer: No Typology Code available for payment source | Attending: Adult Health | Admitting: Medical

## 2020-01-20 ENCOUNTER — Encounter: Payer: Self-pay | Admitting: Licensed Clinical Social Worker

## 2020-01-20 ENCOUNTER — Ambulatory Visit: Payer: 59

## 2020-01-20 ENCOUNTER — Encounter: Payer: Self-pay | Admitting: Adult Health

## 2020-01-20 ENCOUNTER — Inpatient Hospital Stay (HOSPITAL_BASED_OUTPATIENT_CLINIC_OR_DEPARTMENT_OTHER): Payer: No Typology Code available for payment source | Admitting: Adult Health

## 2020-01-20 ENCOUNTER — Other Ambulatory Visit: Payer: Self-pay

## 2020-01-20 ENCOUNTER — Ambulatory Visit
Admission: RE | Admit: 2020-01-20 | Discharge: 2020-01-20 | Disposition: A | Discharge status: Home / Self Care | Payer: No Typology Code available for payment source | Source: Ambulatory Visit | Attending: Radiation Oncology | Admitting: Radiation Oncology

## 2020-01-20 VITALS — BP 117/90 | HR 88 | Temp 98.5°F | Resp 17 | Ht 63.0 in | Wt 117.6 lb

## 2020-01-20 DIAGNOSIS — E2839 Other primary ovarian failure: Secondary | ICD-10-CM

## 2020-01-20 DIAGNOSIS — C50311 Malignant neoplasm of lower-inner quadrant of right female breast: Secondary | ICD-10-CM | POA: Diagnosis not present

## 2020-01-20 DIAGNOSIS — R634 Abnormal weight loss: Secondary | ICD-10-CM | POA: Diagnosis not present

## 2020-01-20 DIAGNOSIS — Z5111 Encounter for antineoplastic chemotherapy: Secondary | ICD-10-CM | POA: Diagnosis not present

## 2020-01-20 DIAGNOSIS — Z170 Estrogen receptor positive status [ER+]: Secondary | ICD-10-CM

## 2020-01-20 DIAGNOSIS — R5383 Other fatigue: Secondary | ICD-10-CM

## 2020-01-20 DIAGNOSIS — Z95828 Presence of other vascular implants and grafts: Secondary | ICD-10-CM

## 2020-01-20 MED ORDER — ACETAMINOPHEN 325 MG PO TABS
ORAL_TABLET | ORAL | Status: AC
  Filled 2020-01-20: qty 2

## 2020-01-20 MED ORDER — ACETAMINOPHEN 325 MG PO TABS
650.0000 mg | ORAL_TABLET | Freq: Once | ORAL | Status: AC
  Administered 2020-01-20: 650 mg via ORAL

## 2020-01-20 MED ORDER — TRASTUZUMAB-HYALURONIDASE-OYSK 600-10000 MG-UNT/5ML ~~LOC~~ SOLN
600.0000 mg | Freq: Once | SUBCUTANEOUS | Status: AC
  Administered 2020-01-20: 600 mg via SUBCUTANEOUS
  Filled 2020-01-20: qty 5

## 2020-01-20 MED ORDER — ANASTROZOLE 1 MG PO TABS: 1.0000 mg | ORAL_TABLET | Freq: Every day | ORAL | 6 refills | Status: DC

## 2020-01-20 MED ORDER — DIPHENHYDRAMINE HCL 25 MG PO CAPS
ORAL_CAPSULE | ORAL | Status: AC
  Filled 2020-01-20: qty 2

## 2020-01-20 MED ORDER — DIPHENHYDRAMINE HCL 25 MG PO CAPS: 50.0000 mg | ORAL_CAPSULE | Freq: Once | ORAL | Status: DC

## 2020-01-20 NOTE — Patient Instructions (Signed)
[facility] Discharge Instructions for Patients Receiving Chemotherapy  Today you received the following chemotherapy agents herceptin hylecta   To help prevent nausea and vomiting after your treatment, we encourage you to take your nausea medication as directed.    If you develop nausea and vomiting that is not controlled by your nausea medication, call the clinic.   BELOW ARE SYMPTOMS THAT SHOULD BE REPORTED IMMEDIATELY:  *FEVER GREATER THAN 100.5 F  *CHILLS WITH OR WITHOUT FEVER  NAUSEA AND VOMITING THAT IS NOT CONTROLLED WITH YOUR NAUSEA MEDICATION  *UNUSUAL SHORTNESS OF BREATH  *UNUSUAL BRUISING OR BLEEDING  TENDERNESS IN MOUTH AND THROAT WITH OR WITHOUT PRESENCE OF ULCERS  *URINARY PROBLEMS  *BOWEL PROBLEMS  UNUSUAL RASH Items with * indicate a potential emergency and should be followed up as soon as possible.  Feel free to call the clinic should you have any questions or concerns. The clinic phone number is [phone].  Please show the CHEMO ALERT CARD at check-in to the Emergency Department and triage nurse.   

## 2020-01-20 NOTE — Telephone Encounter (Signed)
Called pt to inform her she will need to come in next week to complete vitamin panel for labs. Pt prefers to come on 7/19 before radiation tx. Message sent to scheduling requesting they call pt to set up appt for before rad tx 7/19.

## 2020-01-20 NOTE — Telephone Encounter (Signed)
Nutrition  RD scheduled to see patient during infusion today.  When RD came to infusion area patient had already been discharged.     Chart reviewed.  Noted increased stressors.  Surgery planned  Weight decrease to 117 lb today decreased from 123 lb on 12/30/19  136 lb in March 2021.  Estimated energy needs; 1700-1900 calories, 85-85 g protein, > 1.7 L fluid  Intervention: RD called patient this afternoon but no answer.  Left message with call back number.   Spoke with Mendel Ryder, NP regarding concern for continued weight loss, decrease intake (at last visit patient said she was eating about 800 calories) and upcoming surgery.  Patient at increased risk of vitamin and mineral deficiences related [place]  Recommend checking thiamine, Vit B 12, folate, iron, Vit D, A, E, K, zinc and copper.   NP has referred patient to Education officer, museum as well.    Dameion Briles B. Zenia Resides, [place], [place] Registered Dietitian [phone] (pager)

## 2020-01-20 NOTE — Progress Notes (Signed)
[place] Work  Clinical Social Work was referred by L. Delice Bison, NP for assessment of psychosocial needs.  Clinical Social Worker contacted patient by phone  to offer support and assess for needs.    Patient is experiencing increased stress and anxiety recently. Describes herself as an anxious person but worse in the last 6 months or so, and likely to be increased over the next 8 months, due to life changes. Was diagnosed with breast cancer in October 2020, has another surgery upcoming end of August. She and her husband are currently selling their house (so need to pack), she will be moving in with her sister for ~6 months while husband is deployed. When he returns, they will be moving, but do not know where he will be assigned yet. Many factors beyond her control.   Patient has previously found exercise and coloring helpful but has not done these recently. Discussed ways to incorporate some exercise (like stretching or tai chi) while being aware of wanting to maintain or gain weight, not lose. Also began discussion on grounding skills (breathing, five senses) and ways to turn these stressors into small, achievable steps when possible. She will focus on restarting coloring, assigning time frames to her to-do list, and grounding with her five senses.  Next visit: Phone visit 7/22 at 8:30am.    Amador Braddy, Edwinna Areola, [place] Worker [facility]

## 2020-01-21 ENCOUNTER — Telehealth: Payer: Self-pay | Admitting: Oncology

## 2020-01-21 ENCOUNTER — Ambulatory Visit
Admission: RE | Admit: 2020-01-21 | Discharge: 2020-01-21 | Disposition: A | Discharge status: Home / Self Care | Payer: No Typology Code available for payment source | Source: Ambulatory Visit | Attending: Radiation Oncology | Admitting: Radiation Oncology

## 2020-01-21 ENCOUNTER — Other Ambulatory Visit: Payer: Self-pay

## 2020-01-21 DIAGNOSIS — Z5111 Encounter for antineoplastic chemotherapy: Secondary | ICD-10-CM | POA: Diagnosis not present

## 2020-01-21 LAB — CALCITRIOL (1,25 DI-OH VIT D): Vit D, 1,25-Dihydroxy: 43.5 pg/mL (ref 19.9–79.3)

## 2020-01-21 NOTE — Telephone Encounter (Signed)
Scheduled appt per 7/14 sch msg - pt is aware of appt added.

## 2020-01-21 NOTE — Telephone Encounter (Signed)
No 7/14 los. No changes made to pt's schedule.  °

## 2020-01-22 ENCOUNTER — Ambulatory Visit
Admission: RE | Admit: 2020-01-22 | Discharge: 2020-01-22 | Disposition: A | Discharge status: Home / Self Care | Payer: No Typology Code available for payment source | Source: Ambulatory Visit | Attending: Radiation Oncology | Admitting: Radiation Oncology

## 2020-01-22 DIAGNOSIS — Z5111 Encounter for antineoplastic chemotherapy: Secondary | ICD-10-CM | POA: Diagnosis not present

## 2020-01-25 ENCOUNTER — Inpatient Hospital Stay: Payer: No Typology Code available for payment source

## 2020-01-25 ENCOUNTER — Ambulatory Visit
Admission: RE | Admit: 2020-01-25 | Discharge: 2020-01-25 | Disposition: A | Discharge status: Home / Self Care | Payer: No Typology Code available for payment source | Source: Ambulatory Visit | Attending: Radiation Oncology | Admitting: Radiation Oncology

## 2020-01-25 ENCOUNTER — Other Ambulatory Visit: Payer: Self-pay

## 2020-01-25 DIAGNOSIS — Z170 Estrogen receptor positive status [ER+]: Secondary | ICD-10-CM

## 2020-01-25 DIAGNOSIS — C50311 Malignant neoplasm of lower-inner quadrant of right female breast: Secondary | ICD-10-CM

## 2020-01-25 DIAGNOSIS — Z5111 Encounter for antineoplastic chemotherapy: Secondary | ICD-10-CM | POA: Diagnosis not present

## 2020-01-25 DIAGNOSIS — R634 Abnormal weight loss: Secondary | ICD-10-CM

## 2020-01-25 LAB — CMP (CANCER CENTER ONLY)
ALT: 27 U/L (ref 0–44)
AST: 27 U/L (ref 15–41)
Albumin: 4.3 g/dL (ref 3.5–5.0)
Alkaline Phosphatase: 81 U/L (ref 38–126)
Anion gap: 9 (ref 5–15)
BUN: 15 mg/dL (ref 6–20)
CO2: 27 mmol/L (ref 22–32)
Calcium: 10 mg/dL (ref 8.9–10.3)
Chloride: 105 mmol/L (ref 98–111)
Creatinine: 0.78 mg/dL (ref 0.44–1.00)
GFR, Est AFR Am: >60 mL/min (ref >60)
GFR, Estimated: >60 mL/min (ref >60)
Glucose, Bld: 110 mg/dL — ABNORMAL HIGH (ref 70–99)
Potassium: 4.1 mmol/L (ref 3.5–5.1)
Sodium: 141 mmol/L (ref 135–145)
Total Bilirubin: 0.5 mg/dL (ref 0.3–1.2)
Total Protein: 7.3 g/dL (ref 6.5–8.1)

## 2020-01-25 LAB — VITAMIN D 25 HYDROXY (VIT D DEFICIENCY, FRACTURES): Vit D, 25-Hydroxy: 38.72 ng/mL (ref 30–100)

## 2020-01-25 LAB — CBC WITH DIFFERENTIAL (CANCER CENTER ONLY)
Abs Immature Granulocytes: 0 10*3/uL (ref 0.00–0.07)
Basophils Absolute: 0 10*3/uL (ref 0.0–0.1)
Basophils Relative: 0 %
Eosinophils Absolute: 0.1 10*3/uL (ref 0.0–0.5)
Eosinophils Relative: 3 %
HCT: 38.7 % (ref 36.0–46.0)
Hemoglobin: 13.1 g/dL (ref 12.0–15.0)
Immature Granulocytes: 0 %
Lymphocytes Relative: 21 %
Lymphs Abs: 0.6 10*3/uL — ABNORMAL LOW (ref 0.7–4.0)
MCH: 30.3 pg (ref 26.0–34.0)
MCHC: 33.9 g/dL (ref 30.0–36.0)
MCV: 89.4 fL (ref 80.0–100.0)
Monocytes Absolute: 0.3 10*3/uL (ref 0.1–1.0)
Monocytes Relative: 12 %
Neutro Abs: 1.8 10*3/uL (ref 1.7–7.7)
Neutrophils Relative %: 64 %
Platelet Count: 199 10*3/uL (ref 150–400)
RBC: 4.33 MIL/uL (ref 3.87–5.11)
RDW: 12.3 % (ref 11.5–15.5)
WBC Count: 2.9 10*3/uL — ABNORMAL LOW (ref 4.0–10.5)
nRBC: 0 % (ref 0.0–0.2)

## 2020-01-25 LAB — VITAMIN B12: Vitamin B-12: 294 pg/mL (ref 180–914)

## 2020-01-25 LAB — FOLATE: Folate: 20.3 ng/mL (ref >5.9)

## 2020-01-26 ENCOUNTER — Ambulatory Visit
Admission: RE | Admit: 2020-01-26 | Discharge: 2020-01-26 | Disposition: A | Discharge status: Home / Self Care | Payer: No Typology Code available for payment source | Source: Ambulatory Visit | Attending: Radiation Oncology | Admitting: Radiation Oncology

## 2020-01-26 ENCOUNTER — Other Ambulatory Visit: Payer: Self-pay

## 2020-01-26 DIAGNOSIS — Z5111 Encounter for antineoplastic chemotherapy: Secondary | ICD-10-CM | POA: Diagnosis not present

## 2020-01-26 LAB — IRON AND TIBC
Iron: 74 ug/dL (ref 41–142)
Saturation Ratios: 22 % (ref 21–57)
TIBC: 333 ug/dL (ref 236–444)
UIBC: 259 ug/dL (ref 120–384)

## 2020-01-26 LAB — FERRITIN: Ferritin: 76 ng/mL (ref 11–307)

## 2020-01-27 ENCOUNTER — Other Ambulatory Visit: Payer: Self-pay

## 2020-01-27 ENCOUNTER — Inpatient Hospital Stay: Payer: No Typology Code available for payment source

## 2020-01-27 ENCOUNTER — Ambulatory Visit
Admission: RE | Admit: 2020-01-27 | Discharge: 2020-01-27 | Disposition: A | Discharge status: Home / Self Care | Payer: No Typology Code available for payment source | Source: Ambulatory Visit | Attending: Radiation Oncology | Admitting: Radiation Oncology

## 2020-01-27 VITALS — BP 109/76 | HR 93 | Temp 98.7°F | Resp 18

## 2020-01-27 DIAGNOSIS — Z5111 Encounter for antineoplastic chemotherapy: Secondary | ICD-10-CM | POA: Diagnosis not present

## 2020-01-27 DIAGNOSIS — Z95828 Presence of other vascular implants and grafts: Secondary | ICD-10-CM

## 2020-01-27 LAB — COPPER, SERUM: Copper: 96 ug/dL (ref 80–158)

## 2020-01-27 LAB — VITAMIN K1, SERUM: VITAMIN K1: 0.29 ng/mL (ref 0.10–2.20)

## 2020-01-27 LAB — ZINC: Zinc: 67 ug/dL (ref 44–115)

## 2020-01-27 MED ORDER — GOSERELIN ACETATE 3.6 MG ~~LOC~~ IMPL
3.6000 mg | DRUG_IMPLANT | Freq: Once | SUBCUTANEOUS | Status: AC
  Administered 2020-01-27: 3.6 mg via SUBCUTANEOUS

## 2020-01-27 MED ORDER — GOSERELIN ACETATE 3.6 MG ~~LOC~~ IMPL
DRUG_IMPLANT | SUBCUTANEOUS | Status: AC
  Filled 2020-01-27: qty 3.6

## 2020-01-27 NOTE — Progress Notes (Signed)
These results were released to the patient via MyChart

## 2020-01-28 ENCOUNTER — Inpatient Hospital Stay: Payer: No Typology Code available for payment source | Admitting: Licensed Clinical Social Worker

## 2020-01-28 ENCOUNTER — Inpatient Hospital Stay (HOSPITAL_BASED_OUTPATIENT_CLINIC_OR_DEPARTMENT_OTHER): Payer: No Typology Code available for payment source | Admitting: Medical

## 2020-01-28 ENCOUNTER — Other Ambulatory Visit: Payer: Self-pay

## 2020-01-28 ENCOUNTER — Ambulatory Visit
Admission: RE | Admit: 2020-01-28 | Discharge: 2020-01-28 | Disposition: A | Discharge status: Home / Self Care | Payer: No Typology Code available for payment source | Source: Ambulatory Visit | Attending: Radiation Oncology | Admitting: Radiation Oncology

## 2020-01-28 ENCOUNTER — Telehealth: Payer: Self-pay | Admitting: *Deleted

## 2020-01-28 VITALS — BP 107/96 | HR 80 | Temp 98.5°F | Wt 117.1 lb

## 2020-01-28 DIAGNOSIS — C50311 Malignant neoplasm of lower-inner quadrant of right female breast: Secondary | ICD-10-CM | POA: Diagnosis not present

## 2020-01-28 DIAGNOSIS — J029 Acute pharyngitis, unspecified: Secondary | ICD-10-CM

## 2020-01-28 DIAGNOSIS — Z170 Estrogen receptor positive status [ER+]: Secondary | ICD-10-CM | POA: Diagnosis not present

## 2020-01-28 DIAGNOSIS — Z5111 Encounter for antineoplastic chemotherapy: Secondary | ICD-10-CM | POA: Diagnosis not present

## 2020-01-28 LAB — GROUP A STREP BY PCR: Group A Strep by PCR: NOT DETECTED

## 2020-01-28 LAB — VITAMIN B1: Vitamin B1 (Thiamine): 149 nmol/L (ref 66.5–200.0)

## 2020-01-28 MED ORDER — LIDOCAINE VISCOUS HCL 2 % MT SOLN
OROMUCOSAL | 1 refills | Status: DC
Start: 2020-01-28 — End: 2020-02-23

## 2020-01-28 MED ORDER — MAGIC MOUTHWASH
10.0000 mL | Freq: Four times a day (QID) | ORAL | 0 refills | Status: DC | PRN
Start: 2020-01-28 — End: 2020-02-23

## 2020-01-28 MED ORDER — FLUCONAZOLE 150 MG PO TABS
ORAL_TABLET | ORAL | 1 refills | Status: DC
Start: 2020-01-28 — End: 2020-02-23

## 2020-01-28 MED ORDER — AMOXICILLIN-POT CLAVULANATE 250-62.5 MG/5ML PO SUSR: 750.0000 mg | Freq: Two times a day (BID) | ORAL | 0 refills | Status: AC

## 2020-01-28 NOTE — Progress Notes (Signed)
Patient called.

## 2020-01-28 NOTE — Telephone Encounter (Signed)
Pt called with c/o worsening sore throat since last evening. Pt to see Lucianne Lei in [facility] at [place]. Scheduled and confirmed appt.

## 2020-01-28 NOTE — Progress Notes (Signed)
[place] CSW Progress Note  Clinical Education officer, museum contacted patient by phone to follow up on support for anxiety. Kimberly Mueller reports doing much better over the last week. She has been able to engage in enjoyable activities (reading, coloring, walking) and has used some of the grounding skills. She will start to link these with daily activities to make them a regular practice.  Kimberly Mueller was also able to identify ways she has been able to change her thinking to be more helpful and give herself space to relax and not constantly work from her to-do list.  No other concerns at this time. Patient chose to call as needed if anxiety begins to return.    Kimberly Areola Altair Appenzeller LCSW

## 2020-01-29 ENCOUNTER — Telehealth: Payer: Self-pay | Admitting: *Deleted

## 2020-01-29 ENCOUNTER — Ambulatory Visit: Payer: No Typology Code available for payment source

## 2020-01-29 LAB — VITAMIN C: Vitamin C: 1.8 mg/dL (ref 0.4–2.0)

## 2020-01-29 LAB — VITAMIN B6: Vitamin B6: 36.1 ug/L — ABNORMAL HIGH (ref 2.0–32.8)

## 2020-01-29 NOTE — Telephone Encounter (Signed)
Pt called with c/o runny nose and congestion. Pt relate throat is feeling better from yesterday. Called [place] [facility] regarding symptoms, per PA, pt to have xrt cx for today and will start again on Monday. Notified pt of cx appt.

## 2020-02-01 ENCOUNTER — Ambulatory Visit
Admission: RE | Admit: 2020-02-01 | Discharge: 2020-02-01 | Disposition: A | Discharge status: Home / Self Care | Payer: No Typology Code available for payment source | Source: Ambulatory Visit | Attending: Radiation Oncology | Admitting: Radiation Oncology

## 2020-02-01 ENCOUNTER — Other Ambulatory Visit: Payer: Self-pay

## 2020-02-01 DIAGNOSIS — Z5111 Encounter for antineoplastic chemotherapy: Secondary | ICD-10-CM | POA: Diagnosis not present

## 2020-02-01 NOTE — Progress Notes (Signed)
Symptoms Management Clinic Progress Note   Kimberly Mueller [id number] 08-21-1985 34 y.o.  Kimberly Mueller is managed by Dr. Nicholas Lose  Actively treated with chemotherapy/immunotherapy/hormonal therapy: yes  Treatment: Anastrozole, Zoladex, Herceptin and radiation therapy  Next scheduled appointment with provider: 03/02/2020  Assessment: Plan:    Sore throat - Plan: Group A Strep by PCR  Malignant neoplasm of lower-inner quadrant of right breast of female, estrogen receptor positive ([place])   Sore throat: A group A beta strep by PCR returned negative.  The patient was given a prescription for Magic mouthwash and viscous lidocaine.  Additionally she was given a prescription for Augmentin and Diflucan should her symptoms persist through the weekend despite her negative group A beta strep.  ER positive malignant neoplasm of the right breast: The patient is currently receiving radiation therapy and continues on anastrozole, Zoladex and Herceptin.  She is scheduled to be seen in follow-up on 03/02/2020.  Please see After Visit Summary for patient specific instructions.  Future Appointments  Date Time Provider [place]  02/01/2020  4:00 PM [facility] [id number] [facility] None  02/02/2020  4:00 PM [facility] [id number] [facility] None  02/03/2020  4:00 PM [facility] [id number] [facility] None  02/04/2020  4:00 PM [facility] [id number] [facility] None  02/05/2020  4:15 PM [facility] LINAC 3 [facility] None  02/08/2020  4:15 PM [facility] LINAC 3 [facility] None  02/09/2020  4:00 PM [facility] [id number] [facility] None  02/10/2020  9:00 AM [facility] LINAC 3 [facility] None  02/10/2020  9:30 AM [facility] INFUSION [facility] None  02/11/2020  4:15 PM [facility] LINAC 3 [facility] None  02/12/2020  4:15 PM [facility] LINAC 3 [facility] None  02/24/2020  3:30 PM [facility] [place] FLUSH [facility] None  02/29/2020  8:00 AM [facility] PAT 3 [facility] None  03/02/2020  9:00 AM  [facility] LAB 6 [facility] None  03/02/2020  9:30 AM Causey, Charlestine Massed, NP [facility] None  03/02/2020 10:30 AM [facility] INFUSION [facility] None  03/16/2020  7:00 AM [facility] DX DEXA 1 [facility]-BCGDG GI-BREAST CE  03/23/2020  9:00 AM [facility] INFUSION [facility] None  03/28/2020  1:45 PM Everitt Amber, MD [facility]GYNL None  04/04/2020  4:00 PM Suanne Marker, PTA [facility]CR None  04/13/2020 10:00 AM Gardenia Phlegm, NP [facility] None  04/13/2020 11:00 AM [facility] [place] FLUSH [facility] None  04/14/2020  8:30 AM [facility] LAB 2 [facility] None  04/14/2020  9:00 AM Nicholas Lose, MD [facility] None  04/14/2020 10:00 AM [facility] INFUSION [facility] None    Orders Placed This Encounter  Procedures  . Group A Strep by PCR       Subjective:   Patient ID:  Kimberly Mueller is a 34 y.o. (DOB December 30, 1985) female.  Chief Complaint: No chief complaint on file.   HPI Kimberly Mueller  is a 34 y.o. female with a diagnosis of an ER positive malignant neoplasm of the right breast.  She is managed by Dr. Nicholas Lose and is currently receiving radiation therapy and continues on anastrozole, Zoladex and Herceptin.  She presents to the clinic today with a 24 history of a sore throat.  She denies fevers, chills, sweats, cough, nausea, vomiting, constipation, or diarrhea.  She denies any sick contacts.   Medications: I have reviewed the patient's current medications.  Allergies:  Allergies  Allergen Reactions  . Adhesive [Tape]     Sensitivity to tape (not sure what kind)    Past Medical History:  Diagnosis Date  . Breast cancer ([place])   . Cancer ([place])    breast  .  Family history of pancreatic cancer   . Family history of prostate cancer   . GERD (gastroesophageal reflux disease)     Past Surgical History:  Procedure Laterality Date  . BREAST LUMPECTOMY WITH RADIOACTIVE SEED AND SENTINEL LYMPH NODE BIOPSY Right 11/18/2019   Procedure: RIGHT BREAST SEED GUIDED LUMPECTOMY,  RIGHT AXILLARY SENTINEL LYMPH NODE BIOSPY;  Surgeon: Rolm Bookbinder, MD;  Location: [place];  Service: General;  Laterality: Right;  GENERAL AND PECTORAL BLOCK  . FOREIGN BODY REMOVAL N/A 11/18/2019   Procedure: NEXPLANON REMOVAL;  Surgeon: Rolm Bookbinder, MD;  Location: [place];  Service: General;  Laterality: N/A;  GENERAL AND PECTORAL BLOCK  . GASTRIC BYPASS    . HERNIA REPAIR    . PORT-A-CATH REMOVAL N/A 11/18/2019   Procedure: REMOVAL PORT-A-CATH;  Surgeon: Rolm Bookbinder, MD;  Location: [place];  Service: General;  Laterality: N/A;  GENERAL AND PECTORAL BLOCK  . PORTACATH PLACEMENT Right 06/09/2019   Procedure: INSERTION PORT-A-CATH WITH ULTRASOUND;  Surgeon: Rolm Bookbinder, MD;  Location: [place];  Service: General;  Laterality: Right;    Family History  Problem Relation Age of Onset  . Prostate cancer Father   . Pancreatic cancer Paternal Grandmother     Social History   Socioeconomic History  . Marital status: Married    Spouse name: Not on file  . Number of children: Not on file  . Years of education: Not on file  . Highest education level: Not on file  Occupational History  . Not on file  Tobacco Use  . Smoking status: Former Research scientist (life sciences)  . Smokeless tobacco: Never Used  Substance and Sexual Activity  . Alcohol use: Never  . Drug use: Never  . Sexual activity: Not on file  Other Topics Concern  . Not on file  Social History Narrative  . Not on file   Social Determinants of Health   Financial Resource Strain:   . Difficulty of Paying Living Expenses:   Food Insecurity:   . Worried About Charity fundraiser in the Last Year:   . Arboriculturist in the Last Year:   Transportation Needs:   . Film/video editor (Medical):   Marland Kitchen Lack of Transportation (Non-Medical):   Physical Activity:   . Days of Exercise per Week:   . Minutes of Exercise per Session:   Stress:   . Feeling of  Stress :   Social Connections:   . Frequency of Communication with Friends and Family:   . Frequency of Social Gatherings with Friends and Family:   . Attends Religious Services:   . Active Member of Clubs or Organizations:   . Attends Archivist Meetings:   Marland Kitchen Marital Status:   Intimate Partner Violence:   . Fear of Current or Ex-Partner:   . Emotionally Abused:   Marland Kitchen Physically Abused:   . Sexually Abused:     Past Medical History, Surgical history, Social history, and Family history were reviewed and updated as appropriate.   Please see review of systems for further details on the patient's review from today.   Review of Systems:  Review of Systems  Constitutional: Negative for chills, diaphoresis, fatigue and fever.  HENT: Positive for sore throat. Negative for congestion, postnasal drip, rhinorrhea and trouble swallowing.   Respiratory: Negative for cough, shortness of breath and wheezing.   Cardiovascular: Negative for palpitations.  Gastrointestinal: Negative for constipation, diarrhea, nausea and vomiting.  Genitourinary: Negative for  dysuria.  Neurological: Negative for headaches.    Objective:   Physical Exam:  There were no vitals taken for this visit. ECOG: 0  Physical Exam Constitutional:      General: She is not in acute distress.    Appearance: She is not diaphoretic.  HENT:     Head: Normocephalic and atraumatic.     Right Ear: External ear normal.     Left Ear: External ear normal.     Mouth/Throat:     Mouth: Mucous membranes are moist.     Pharynx: Posterior oropharyngeal erythema present. No oropharyngeal exudate.  Eyes:     General: No scleral icterus.       Right eye: No discharge.        Left eye: No discharge.     Conjunctiva/sclera: Conjunctivae normal.  Cardiovascular:     Rate and Rhythm: Normal rate and regular rhythm.     Heart sounds: Normal heart sounds. No murmur heard.  No friction rub. No gallop.   Pulmonary:      Effort: Pulmonary effort is normal. No respiratory distress.     Breath sounds: Normal breath sounds. No wheezing or rales.  Musculoskeletal:     Cervical back: Normal range of motion and neck supple.  Lymphadenopathy:     Cervical: No cervical adenopathy.  Skin:    General: Skin is warm and dry.     Findings: No erythema or rash.  Neurological:     Mental Status: She is alert.     Coordination: Coordination normal.     Gait: Gait normal.  Psychiatric:        Behavior: Behavior normal.        Thought Content: Thought content normal.        Judgment: Judgment normal.     Lab Review:     Component Value Date/Time   NA 141 01/25/2020 1525   K 4.1 01/25/2020 1525   CL 105 01/25/2020 1525   CO2 27 01/25/2020 1525   GLUCOSE 110 (H) 01/25/2020 1525   BUN 15 01/25/2020 1525   CREATININE 0.78 01/25/2020 1525   CALCIUM 10.0 01/25/2020 1525   PROT 7.3 01/25/2020 1525   ALBUMIN 4.3 01/25/2020 1525   AST 27 01/25/2020 1525   ALT 27 01/25/2020 1525   ALKPHOS 81 01/25/2020 1525   BILITOT 0.5 01/25/2020 1525   GFRNONAA >60 01/25/2020 1525   GFRAA >60 01/25/2020 1525       Component Value Date/Time   WBC 2.9 (L) 01/25/2020 1525   WBC 3.3 (L) 06/22/2019 1135   RBC 4.33 01/25/2020 1525   HGB 13.1 01/25/2020 1525   HCT 38.7 01/25/2020 1525   PLT 199 01/25/2020 1525   MCV 89.4 01/25/2020 1525   MCH 30.3 01/25/2020 1525   MCHC 33.9 01/25/2020 1525   RDW 12.3 01/25/2020 1525   LYMPHSABS 0.6 (L) 01/25/2020 1525   MONOABS 0.3 01/25/2020 1525   EOSABS 0.1 01/25/2020 1525   BASOSABS 0.0 01/25/2020 1525   -------------------------------  Imaging from last 24 hours (if applicable):  Radiology interpretation: No results found.

## 2020-02-02 ENCOUNTER — Other Ambulatory Visit: Payer: Self-pay

## 2020-02-02 ENCOUNTER — Ambulatory Visit
Admission: RE | Admit: 2020-02-02 | Discharge: 2020-02-02 | Disposition: A | Discharge status: Home / Self Care | Payer: No Typology Code available for payment source | Source: Ambulatory Visit | Attending: Radiation Oncology | Admitting: Radiation Oncology

## 2020-02-02 ENCOUNTER — Ambulatory Visit: Payer: No Typology Code available for payment source | Admitting: Radiation Oncology

## 2020-02-02 DIAGNOSIS — Z5111 Encounter for antineoplastic chemotherapy: Secondary | ICD-10-CM | POA: Diagnosis not present

## 2020-02-03 ENCOUNTER — Ambulatory Visit
Admission: RE | Admit: 2020-02-03 | Discharge: 2020-02-03 | Disposition: A | Discharge status: Home / Self Care | Payer: No Typology Code available for payment source | Source: Ambulatory Visit | Attending: Radiation Oncology | Admitting: Radiation Oncology

## 2020-02-03 ENCOUNTER — Other Ambulatory Visit: Payer: Self-pay

## 2020-02-03 DIAGNOSIS — Z5111 Encounter for antineoplastic chemotherapy: Secondary | ICD-10-CM | POA: Diagnosis not present

## 2020-02-04 ENCOUNTER — Other Ambulatory Visit: Payer: Self-pay

## 2020-02-04 ENCOUNTER — Ambulatory Visit
Admission: RE | Admit: 2020-02-04 | Discharge: 2020-02-04 | Disposition: A | Discharge status: Home / Self Care | Payer: No Typology Code available for payment source | Source: Ambulatory Visit | Attending: Radiation Oncology | Admitting: Radiation Oncology

## 2020-02-04 DIAGNOSIS — Z5111 Encounter for antineoplastic chemotherapy: Secondary | ICD-10-CM | POA: Diagnosis not present

## 2020-02-05 ENCOUNTER — Ambulatory Visit
Admission: RE | Admit: 2020-02-05 | Discharge: 2020-02-05 | Disposition: A | Discharge status: Home / Self Care | Payer: No Typology Code available for payment source | Source: Ambulatory Visit | Attending: Radiation Oncology | Admitting: Radiation Oncology

## 2020-02-05 ENCOUNTER — Ambulatory Visit: Payer: No Typology Code available for payment source

## 2020-02-05 DIAGNOSIS — Z5111 Encounter for antineoplastic chemotherapy: Secondary | ICD-10-CM | POA: Diagnosis not present

## 2020-02-08 ENCOUNTER — Other Ambulatory Visit: Payer: Self-pay

## 2020-02-08 ENCOUNTER — Telehealth: Payer: Self-pay

## 2020-02-08 ENCOUNTER — Ambulatory Visit
Admission: RE | Admit: 2020-02-08 | Discharge: 2020-02-08 | Disposition: A | Discharge status: Home / Self Care | Payer: No Typology Code available for payment source | Source: Ambulatory Visit | Attending: Radiation Oncology | Admitting: Radiation Oncology

## 2020-02-08 ENCOUNTER — Ambulatory Visit: Payer: No Typology Code available for payment source

## 2020-02-08 DIAGNOSIS — C50311 Malignant neoplasm of lower-inner quadrant of right female breast: Secondary | ICD-10-CM | POA: Insufficient documentation

## 2020-02-08 DIAGNOSIS — Z9884 Bariatric surgery status: Secondary | ICD-10-CM | POA: Insufficient documentation

## 2020-02-08 DIAGNOSIS — Z170 Estrogen receptor positive status [ER+]: Secondary | ICD-10-CM | POA: Insufficient documentation

## 2020-02-08 DIAGNOSIS — Z510 Encounter for antineoplastic radiation therapy: Secondary | ICD-10-CM | POA: Insufficient documentation

## 2020-02-08 DIAGNOSIS — Z79899 Other long term (current) drug therapy: Secondary | ICD-10-CM | POA: Insufficient documentation

## 2020-02-08 DIAGNOSIS — Z800 Family history of malignant neoplasm of digestive organs: Secondary | ICD-10-CM | POA: Insufficient documentation

## 2020-02-08 DIAGNOSIS — Z5112 Encounter for antineoplastic immunotherapy: Secondary | ICD-10-CM | POA: Diagnosis present

## 2020-02-08 DIAGNOSIS — Z6823 Body mass index (BMI) 23.0-23.9, adult: Secondary | ICD-10-CM | POA: Insufficient documentation

## 2020-02-08 DIAGNOSIS — Z8042 Family history of malignant neoplasm of prostate: Secondary | ICD-10-CM | POA: Insufficient documentation

## 2020-02-08 DIAGNOSIS — Z87891 Personal history of nicotine dependence: Secondary | ICD-10-CM | POA: Insufficient documentation

## 2020-02-08 MED ORDER — SONAFINE EX EMUL
1.0000 "application " | Freq: Two times a day (BID) | CUTANEOUS | Status: DC
  Administered 2020-02-08: 1 via TOPICAL

## 2020-02-08 NOTE — Telephone Encounter (Signed)
Told Kimberly Mueller that her forms were completed and faxed this afternoon.

## 2020-02-08 NOTE — Telephone Encounter (Signed)
Pt FMLA and [employer] disability papers faxed today.

## 2020-02-09 ENCOUNTER — Ambulatory Visit
Admission: RE | Admit: 2020-02-09 | Discharge: 2020-02-09 | Disposition: A | Discharge status: Home / Self Care | Payer: No Typology Code available for payment source | Source: Ambulatory Visit | Attending: Radiation Oncology | Admitting: Radiation Oncology

## 2020-02-09 ENCOUNTER — Other Ambulatory Visit: Payer: Self-pay

## 2020-02-09 DIAGNOSIS — Z5112 Encounter for antineoplastic immunotherapy: Secondary | ICD-10-CM | POA: Diagnosis not present

## 2020-02-10 ENCOUNTER — Inpatient Hospital Stay: Payer: No Typology Code available for payment source

## 2020-02-10 ENCOUNTER — Ambulatory Visit
Admission: RE | Admit: 2020-02-10 | Discharge: 2020-02-10 | Disposition: A | Discharge status: Home / Self Care | Payer: No Typology Code available for payment source | Source: Ambulatory Visit | Attending: Radiation Oncology | Admitting: Radiation Oncology

## 2020-02-10 ENCOUNTER — Other Ambulatory Visit: Payer: Self-pay

## 2020-02-10 ENCOUNTER — Other Ambulatory Visit: Payer: Self-pay | Admitting: Hematology and Oncology

## 2020-02-10 ENCOUNTER — Inpatient Hospital Stay: Payer: No Typology Code available for payment source | Attending: Hematology and Oncology

## 2020-02-10 ENCOUNTER — Other Ambulatory Visit: Payer: Self-pay | Admitting: Adult Health

## 2020-02-10 VITALS — BP 120/93 | HR 86 | Temp 99.3°F | Resp 16 | Wt 115.2 lb

## 2020-02-10 DIAGNOSIS — C50311 Malignant neoplasm of lower-inner quadrant of right female breast: Secondary | ICD-10-CM

## 2020-02-10 DIAGNOSIS — Z170 Estrogen receptor positive status [ER+]: Secondary | ICD-10-CM

## 2020-02-10 DIAGNOSIS — Z5112 Encounter for antineoplastic immunotherapy: Secondary | ICD-10-CM | POA: Diagnosis not present

## 2020-02-10 MED ORDER — ACETAMINOPHEN 325 MG PO TABS
650.0000 mg | ORAL_TABLET | Freq: Once | ORAL | Status: AC
  Administered 2020-02-10: 650 mg via ORAL

## 2020-02-10 MED ORDER — TRASTUZUMAB-HYALURONIDASE-OYSK 600-10000 MG-UNT/5ML ~~LOC~~ SOLN
600.0000 mg | Freq: Once | SUBCUTANEOUS | Status: AC
  Administered 2020-02-10: 600 mg via SUBCUTANEOUS
  Filled 2020-02-10: qty 5

## 2020-02-10 MED ORDER — DIPHENHYDRAMINE HCL 25 MG PO CAPS
ORAL_CAPSULE | ORAL | Status: AC
  Filled 2020-02-10: qty 2

## 2020-02-10 MED ORDER — ACETAMINOPHEN 325 MG PO TABS
ORAL_TABLET | ORAL | Status: AC
  Filled 2020-02-10: qty 2

## 2020-02-10 MED ORDER — DIPHENHYDRAMINE HCL 25 MG PO CAPS
50.0000 mg | ORAL_CAPSULE | Freq: Once | ORAL | Status: AC
  Administered 2020-02-10: 50 mg via ORAL

## 2020-02-10 NOTE — Patient Instructions (Addendum)
  [facility] Discharge Instructions for Patients Receiving Chemotherapy  Today you received the following chemotherapy agents Trastuzumab-hyaluronidase-oysk (HERCEPTIN HYLECTA).  To help prevent nausea and vomiting after your treatment, we encourage you to take your nausea medication as prescribed.  If you develop nausea and vomiting that is not controlled by your nausea medication, call the clinic.   BELOW ARE SYMPTOMS THAT SHOULD BE REPORTED IMMEDIATELY:  *FEVER GREATER THAN 100.5 F  *CHILLS WITH OR WITHOUT FEVER  NAUSEA AND VOMITING THAT IS NOT CONTROLLED WITH YOUR NAUSEA MEDICATION  *UNUSUAL SHORTNESS OF BREATH  *UNUSUAL BRUISING OR BLEEDING  TENDERNESS IN MOUTH AND THROAT WITH OR WITHOUT PRESENCE OF ULCERS  *URINARY PROBLEMS  *BOWEL PROBLEMS  UNUSUAL RASH Items with * indicate a potential emergency and should be followed up as soon as possible.  Feel free to call the clinic should you have any questions or concerns. The clinic phone number is [phone].  Please show the CHEMO ALERT CARD at check-in to the Emergency Department and triage nurse.   

## 2020-02-10 NOTE — Progress Notes (Addendum)
Nutrition Follow-up:  Patient with right breast cancer, receiving herceptin q 3 weeks.  Last radiation treatment planned for 8/6.  Planning bilateral salpingo oophorectomy on 8/31.  Met with patient during infusion today.  Patient reports appetite is good. "I feel like I eat all the time."  Denies nausea, constipation is better.  Reports that meat sits heavy with her and she has not been eating much of it. Reports that she has been liberalizing diet.  Eats bagel with cream cheese for breakfast or chocolate muffin.  Lunch is pasta with marinara sauce sometimes she will have pizza.  Dinner maybe just snacks.  Likes to snack on trail mix, chips, sometimes popcorn, lowfat string cheese.  Reports drinking premier protein shakes 2 times daily. Can't tolerate higher calorie shakes.     Noted social work has been working with patient.   Medications: reviewed  Labs: reviewed vit and mineral labs  Anthropometrics:   Weight decreased to 115 lb 4 oz today from 117 lb 1.6 oz on 7/22  123 lb 6/23 136 lb 3/21 146 lb 2/17 158 lb 1/13 166 lb 06/2019  31% weight loss in the last 7 months, significant   Estimated Energy Needs  Kcals: 1700-1900 Protein: 85-95 g Fluid: > 1.7 L  NUTRITION DIAGNOSIS: Unintentional weight loss continues   INTERVENTION:  With continued weight loss discussed possiblity of feeding tube placement.  Patient declined at this time.  Discussed importance of reaching calorie and protein goals daily.  Patient willing to track/log calories daily to help keep her on track.  "I feel like I am eating all day but I don't really know how many calories I am taking in."  Past visits has reported eating ~800 calories) Reviewed ways to add in calories and protein (whole full fat items, nuts, nut butters, cheese, heart healthy oils). Discussed importance of adequate nutrition with upcoming surgery.   Encouraged patient to continue to take bariatric specific MVI daily and calcium 565m  TID.  Handout given with examples of MVIs that meet this criteria.  Discussed nutritional concerns with SEducation officer, museum    Patient has contact information    MONITORING, EVALUATION, GOAL: weight trends, intake   NEXT VISIT: August 25 during infusion  Japneet Staggs B. AZenia Resides RHondo LWagnerRegistered Dietitian [phone](mobile)

## 2020-02-11 ENCOUNTER — Ambulatory Visit
Admission: RE | Admit: 2020-02-11 | Discharge: 2020-02-11 | Disposition: A | Discharge status: Home / Self Care | Payer: No Typology Code available for payment source | Source: Ambulatory Visit | Attending: Radiation Oncology | Admitting: Radiation Oncology

## 2020-02-11 ENCOUNTER — Encounter: Payer: Self-pay | Admitting: *Deleted

## 2020-02-11 ENCOUNTER — Other Ambulatory Visit: Payer: Self-pay

## 2020-02-11 ENCOUNTER — Ambulatory Visit: Payer: No Typology Code available for payment source

## 2020-02-11 DIAGNOSIS — Z5112 Encounter for antineoplastic immunotherapy: Secondary | ICD-10-CM | POA: Diagnosis not present

## 2020-02-12 ENCOUNTER — Other Ambulatory Visit: Payer: Self-pay

## 2020-02-12 ENCOUNTER — Telehealth: Payer: Self-pay | Admitting: Licensed Clinical Social Worker

## 2020-02-12 ENCOUNTER — Ambulatory Visit
Admission: RE | Admit: 2020-02-12 | Discharge: 2020-02-12 | Disposition: A | Discharge status: Home / Self Care | Payer: No Typology Code available for payment source | Source: Ambulatory Visit | Attending: Radiation Oncology | Admitting: Radiation Oncology

## 2020-02-12 ENCOUNTER — Encounter: Payer: Self-pay | Admitting: Radiation Oncology

## 2020-02-12 DIAGNOSIS — Z5112 Encounter for antineoplastic immunotherapy: Secondary | ICD-10-CM | POA: Diagnosis not present

## 2020-02-12 NOTE — Telephone Encounter (Signed)
[place] Clinical Social Work  [place] representative received update from [place] regarding concerns about weight and that anxiety may have increased again. CSW attempted to reach patient by phone to follow-up. No answer. Left VM with direct contact information.    Edwinna Areola Jontrell Bushong , LCSW

## 2020-02-15 NOTE — Progress Notes (Incomplete)
  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 She did report mild to moderate fatigue, right nipple tenderness, and an intermittent, sharp, shotting pain in her right breast. She denied chest pain, shortness of breath, and difficulty with range of motion of her right arm. Throughout treatment, the right breast area was noted to have mild erythema and hyperpigmentation changes without signs of infection. The erythema became bright and diffuse but no desquamation developed. There was no evidence of cording, decreased range of motion, or lymphedema.  Plan: The patient will follow-up with radiation oncology in one month.  ________________________________________________   Blair Promise, PhD, MD  This document serves as a record of services personally performed by Gery Pray, MD. It was created on his behalf by Clerance Lav, a trained medical scribe. The creation of this record is based on the scribe's personal observations and the provider's statements to them. This document has  been checked and approved by the attending provider.

## 2020-02-17 ENCOUNTER — Ambulatory Visit: Payer: 59

## 2020-02-24 ENCOUNTER — Ambulatory Visit: Payer: No Typology Code available for payment source

## 2020-02-24 ENCOUNTER — Ambulatory Visit: Payer: No Typology Code available for payment source | Admitting: Hematology and Oncology

## 2020-02-24 ENCOUNTER — Other Ambulatory Visit: Payer: No Typology Code available for payment source

## 2020-02-25 NOTE — Patient Instructions (Addendum)
DUE TO COVID-19 ONLY ONE VISITOR IS ALLOWED TO COME WITH YOU AND STAY IN THE WAITING ROOM ONLY DURING PRE OP AND PROCEDURE DAY OF SURGERY. THE 1 VISITOR  MAY VISIT WITH YOU AFTER SURGERY IN YOUR PRIVATE ROOM DURING VISITING HOURS ONLY!  YOU NEED TO HAVE A COVID 19 TEST ON: 03/04/20 @ 8:20 , THIS TEST MUST BE DONE BEFORE SURGERY,  COVID TESTING SITE [address] [place] [address], IT IS ON THE RIGHT GOING OUT [address] APPROXIMATELY  2 MINUTES PAST [employer] ON THE RIGHT. ONCE YOUR COVID TEST IS COMPLETED,  PLEASE BEGIN THE QUARANTINE INSTRUCTIONS AS OUTLINED IN YOUR HANDOUT.                Kimberly Mueller    Your procedure is scheduled on: 03/08/20   Report to [facility] Main  Entrance   Report to short stay at: 5:30 AM     Call this number if you have problems the morning of surgery [phone]    Remember: Do not eat food or drink liquids :After Midnight.   BRUSH YOUR TEETH MORNING OF SURGERY AND RINSE YOUR MOUTH OUT, NO CHEWING GUM CANDY OR MINTS.     Take these medicines the morning of surgery with A SIP OF WATER:omeprazole                              You may not have any metal on your body including hair pins and              piercings  Do not wear jewelry, make-up, lotions, powders or perfumes, deodorant             Do not wear nail polish on your fingernails.  Do not shave  48 hours prior to surgery.         Do not bring valuables to the hospital. [place].  Contacts, dentures or bridgework may not be worn into surgery.  Leave suitcase in the car. After surgery it may be brought to your room.     Patients discharged the day of surgery will not be allowed to drive home. IF YOU ARE HAVING SURGERY AND GOING HOME THE SAME DAY, YOU MUST HAVE AN ADULT TO DRIVE YOU HOME AND BE WITH YOU FOR 24 HOURS. YOU MAY GO HOME BY TAXI OR UBER OR ORTHERWISE, BUT AN ADULT MUST ACCOMPANY YOU HOME AND STAY  WITH YOU FOR 24 HOURS.  Name and phone number of your driver:  Special Instructions: N/A              Please read over the following fact sheets you were given: _____________________________________________________________________          [facility] - Preparing for Surgery Before surgery, you can play an important role.  Because skin is not sterile, your skin needs to be as free of germs as possible.  You can reduce the number of germs on your skin by washing with CHG (chlorahexidine gluconate) soap before surgery.  CHG is an antiseptic cleaner which kills germs and bonds with the skin to continue killing germs even after washing. Please DO NOT use if you have an allergy to CHG or antibacterial soaps.  If your skin becomes reddened/irritated stop using the CHG and inform your nurse when you arrive at  Short Stay. Do not shave (including legs and underarms) for at least 48 hours prior to the first CHG shower.  You may shave your face/neck. Please follow these instructions carefully:  1.  Shower with CHG Soap the night before surgery and the  morning of Surgery.  2.  If you choose to wash your hair, wash your hair first as usual with your  normal  shampoo.  3.  After you shampoo, rinse your hair and body thoroughly to remove the  shampoo.                           4.  Use CHG as you would any other liquid soap.  You can apply chg directly  to the skin and wash                       Gently with a scrungie or clean washcloth.  5.  Apply the CHG Soap to your body ONLY FROM THE NECK DOWN.   Do not use on face/ open                           Wound or open sores. Avoid contact with eyes, ears mouth and genitals (private parts).                       Wash face,  Genitals (private parts) with your normal soap.             6.  Wash thoroughly, paying special attention to the area where your surgery  will be performed.  7.  Thoroughly rinse your body with warm water from the neck down.  8.  DO NOT  shower/wash with your normal soap after using and rinsing off  the CHG Soap.                9.  Pat yourself dry with a clean towel.            10.  Wear clean pajamas.            11.  Place clean sheets on your bed the night of your first shower and do not  sleep with pets. Day of Surgery : Do not apply any lotions/deodorants the morning of surgery.  Please wear clean clothes to the hospital/surgery center.  FAILURE TO FOLLOW THESE INSTRUCTIONS MAY RESULT IN THE CANCELLATION OF YOUR SURGERY PATIENT SIGNATURE_________________________________  NURSE SIGNATURE__________________________________  ________________________________________________________________________

## 2020-02-29 ENCOUNTER — Encounter (HOSPITAL_COMMUNITY)
Admission: RE | Admit: 2020-02-29 | Discharge: 2020-02-29 | Disposition: A | Discharge status: Home / Self Care | Payer: No Typology Code available for payment source | Source: Ambulatory Visit | Attending: Gynecologic Oncology | Admitting: Gynecologic Oncology

## 2020-02-29 ENCOUNTER — Encounter (HOSPITAL_COMMUNITY): Payer: Self-pay

## 2020-02-29 ENCOUNTER — Encounter: Payer: Self-pay | Admitting: Gynecologic Oncology

## 2020-02-29 ENCOUNTER — Other Ambulatory Visit: Payer: Self-pay

## 2020-02-29 ENCOUNTER — Inpatient Hospital Stay: Payer: No Typology Code available for payment source

## 2020-02-29 ENCOUNTER — Other Ambulatory Visit: Payer: Self-pay | Admitting: Gynecologic Oncology

## 2020-02-29 VITALS — BP 108/82 | HR 57 | Temp 98.4°F | Resp 18

## 2020-02-29 DIAGNOSIS — Z5112 Encounter for antineoplastic immunotherapy: Secondary | ICD-10-CM | POA: Diagnosis not present

## 2020-02-29 DIAGNOSIS — Z95828 Presence of other vascular implants and grafts: Secondary | ICD-10-CM

## 2020-02-29 DIAGNOSIS — C50311 Malignant neoplasm of lower-inner quadrant of right female breast: Secondary | ICD-10-CM

## 2020-02-29 DIAGNOSIS — Z01812 Encounter for preprocedural laboratory examination: Secondary | ICD-10-CM | POA: Insufficient documentation

## 2020-02-29 DIAGNOSIS — G8918 Other acute postprocedural pain: Secondary | ICD-10-CM

## 2020-02-29 DIAGNOSIS — Z170 Estrogen receptor positive status [ER+]: Secondary | ICD-10-CM

## 2020-02-29 DIAGNOSIS — C50911 Malignant neoplasm of unspecified site of right female breast: Secondary | ICD-10-CM

## 2020-02-29 HISTORY — DX: Anxiety disorder, unspecified: F41.9

## 2020-02-29 HISTORY — DX: Depression, unspecified: F32.A

## 2020-02-29 LAB — BASIC METABOLIC PANEL
Anion gap: 5 (ref 5–15)
BUN: 19 mg/dL (ref 6–20)
CO2: 28 mmol/L (ref 22–32)
Calcium: 9.9 mg/dL (ref 8.9–10.3)
Chloride: 105 mmol/L (ref 98–111)
Creatinine, Ser: 0.57 mg/dL (ref 0.44–1.00)
GFR calc Af Amer: >60 mL/min (ref >60)
GFR calc non Af Amer: >60 mL/min (ref >60)
Glucose, Bld: 94 mg/dL (ref 70–99)
Potassium: 5.3 mmol/L — ABNORMAL HIGH (ref 3.5–5.1)
Sodium: 138 mmol/L (ref 135–145)

## 2020-02-29 LAB — URINALYSIS, ROUTINE W REFLEX MICROSCOPIC
Bilirubin Urine: NEGATIVE
Glucose, UA: NEGATIVE mg/dL
Ketones, ur: NEGATIVE mg/dL
Nitrite: NEGATIVE
Protein, ur: NEGATIVE mg/dL
Specific Gravity, Urine: 1.02 (ref 1.005–1.030)
pH: 6 (ref 5.0–8.0)

## 2020-02-29 LAB — CBC WITH DIFFERENTIAL/PLATELET
Abs Immature Granulocytes: 0 10*3/uL (ref 0.00–0.07)
Basophils Absolute: 0 10*3/uL (ref 0.0–0.1)
Basophils Relative: 1 %
Eosinophils Absolute: 0.2 10*3/uL (ref 0.0–0.5)
Eosinophils Relative: 7 %
HCT: 40.3 % (ref 36.0–46.0)
Hemoglobin: 13.6 g/dL (ref 12.0–15.0)
Immature Granulocytes: 0 %
Lymphocytes Relative: 23 %
Lymphs Abs: 0.5 10*3/uL — ABNORMAL LOW (ref 0.7–4.0)
MCH: 30.9 pg (ref 26.0–34.0)
MCHC: 33.7 g/dL (ref 30.0–36.0)
MCV: 91.6 fL (ref 80.0–100.0)
Monocytes Absolute: 0.3 10*3/uL (ref 0.1–1.0)
Monocytes Relative: 11 %
Neutro Abs: 1.3 10*3/uL — ABNORMAL LOW (ref 1.7–7.7)
Neutrophils Relative %: 58 %
Platelets: 200 10*3/uL (ref 150–400)
RBC: 4.4 MIL/uL (ref 3.87–5.11)
RDW: 14 % (ref 11.5–15.5)
WBC: 2.3 10*3/uL — ABNORMAL LOW (ref 4.0–10.5)
nRBC: 0 % (ref 0.0–0.2)

## 2020-02-29 LAB — CBC
HCT: 40.5 % (ref 36.0–46.0)
Hemoglobin: 13.6 g/dL (ref 12.0–15.0)
MCH: 30.6 pg (ref 26.0–34.0)
MCHC: 33.6 g/dL (ref 30.0–36.0)
MCV: 91 fL (ref 80.0–100.0)
Platelets: 189 10*3/uL (ref 150–400)
RBC: 4.45 MIL/uL (ref 3.87–5.11)
RDW: 13.8 % (ref 11.5–15.5)
WBC: 2.4 10*3/uL — ABNORMAL LOW (ref 4.0–10.5)
nRBC: 0 % (ref 0.0–0.2)

## 2020-02-29 MED ORDER — OXYCODONE HCL 5 MG PO TABS: 5.0000 mg | ORAL_TABLET | ORAL | 0 refills | Status: DC | PRN

## 2020-02-29 MED ORDER — GOSERELIN ACETATE 3.6 MG ~~LOC~~ IMPL
3.6000 mg | DRUG_IMPLANT | Freq: Once | SUBCUTANEOUS | Status: AC
  Administered 2020-02-29: 3.6 mg via SUBCUTANEOUS

## 2020-02-29 MED ORDER — GOSERELIN ACETATE 3.6 MG ~~LOC~~ IMPL
DRUG_IMPLANT | SUBCUTANEOUS | Status: AC
  Filled 2020-02-29: qty 3.6

## 2020-02-29 NOTE — Patient Instructions (Signed)

## 2020-02-29 NOTE — Progress Notes (Addendum)
COVID Vaccine Completed:yes Date COVID Vaccine completed: 12/01/19 COVID vaccine manufacturer: Pfizer    *Kimberly Mueller [employer]   PCP - Dr. Newell Coral Cardiologist -  No Chest x-ray - CT chest: 06/05/19. EKG - 09/30/19 EPIC Stress Test -  ECHO - 12/16/19 Cardiac Cath -   Sleep Study -  CPAP -   Fasting Blood Sugar -  Checks Blood Sugar _____ times a day  Blood Thinner Instructions: Aspirin Instructions: Last Dose:  Anesthesia review:   Patient denies shortness of breath, fever, cough and chest pain at PAT appointment   Patient verbalized understanding of instructions that were given to them at the PAT appointment. Patient was also instructed that they will need to review over the PAT instructions again at home before surgery.

## 2020-02-29 NOTE — Progress Notes (Signed)
Lab results: WBC: 2.3.

## 2020-02-29 NOTE — Progress Notes (Signed)
Pre-op oxycodone sent in to [facility].  Patient does not use NSAIDS and uses Miralax so did not need a prescription for sennakot-S.

## 2020-03-01 LAB — URINE CULTURE

## 2020-03-01 NOTE — Progress Notes (Signed)
Urine culture: abnormal result.

## 2020-03-02 ENCOUNTER — Inpatient Hospital Stay: Payer: No Typology Code available for payment source

## 2020-03-02 ENCOUNTER — Other Ambulatory Visit: Payer: Self-pay

## 2020-03-02 ENCOUNTER — Inpatient Hospital Stay (HOSPITAL_BASED_OUTPATIENT_CLINIC_OR_DEPARTMENT_OTHER): Payer: No Typology Code available for payment source | Admitting: Adult Health

## 2020-03-02 ENCOUNTER — Telehealth: Payer: Self-pay | Admitting: Adult Health

## 2020-03-02 VITALS — BP 106/72 | HR 88 | Temp 97.4°F | Resp 18 | Ht 63.0 in | Wt 117.0 lb

## 2020-03-02 DIAGNOSIS — Z170 Estrogen receptor positive status [ER+]: Secondary | ICD-10-CM

## 2020-03-02 DIAGNOSIS — Z5112 Encounter for antineoplastic immunotherapy: Secondary | ICD-10-CM | POA: Diagnosis not present

## 2020-03-02 DIAGNOSIS — C50311 Malignant neoplasm of lower-inner quadrant of right female breast: Secondary | ICD-10-CM

## 2020-03-02 LAB — CMP (CANCER CENTER ONLY)
ALT: 23 U/L (ref 0–44)
AST: 23 U/L (ref 15–41)
Albumin: 4.2 g/dL (ref 3.5–5.0)
Alkaline Phosphatase: 84 U/L (ref 38–126)
Anion gap: 8 (ref 5–15)
BUN: 9 mg/dL (ref 6–20)
CO2: 28 mmol/L (ref 22–32)
Calcium: 10.3 mg/dL (ref 8.9–10.3)
Chloride: 104 mmol/L (ref 98–111)
Creatinine: 0.7 mg/dL (ref 0.44–1.00)
GFR, Est AFR Am: >60 mL/min (ref >60)
GFR, Estimated: >60 mL/min (ref >60)
Glucose, Bld: 90 mg/dL (ref 70–99)
Potassium: 3.9 mmol/L (ref 3.5–5.1)
Sodium: 140 mmol/L (ref 135–145)
Total Bilirubin: 0.6 mg/dL (ref 0.3–1.2)
Total Protein: 7 g/dL (ref 6.5–8.1)

## 2020-03-02 LAB — CBC WITH DIFFERENTIAL (CANCER CENTER ONLY)
Abs Immature Granulocytes: 0.01 10*3/uL (ref 0.00–0.07)
Basophils Absolute: 0 10*3/uL (ref 0.0–0.1)
Basophils Relative: 1 %
Eosinophils Absolute: 0.1 10*3/uL (ref 0.0–0.5)
Eosinophils Relative: 5 %
HCT: 38.6 % (ref 36.0–46.0)
Hemoglobin: 13.2 g/dL (ref 12.0–15.0)
Immature Granulocytes: 0 %
Lymphocytes Relative: 25 %
Lymphs Abs: 0.7 10*3/uL (ref 0.7–4.0)
MCH: 30.3 pg (ref 26.0–34.0)
MCHC: 34.2 g/dL (ref 30.0–36.0)
MCV: 88.5 fL (ref 80.0–100.0)
Monocytes Absolute: 0.3 10*3/uL (ref 0.1–1.0)
Monocytes Relative: 12 %
Neutro Abs: 1.6 10*3/uL — ABNORMAL LOW (ref 1.7–7.7)
Neutrophils Relative %: 57 %
Platelet Count: 192 10*3/uL (ref 150–400)
RBC: 4.36 MIL/uL (ref 3.87–5.11)
RDW: 13.3 % (ref 11.5–15.5)
WBC Count: 2.7 10*3/uL — ABNORMAL LOW (ref 4.0–10.5)
nRBC: 0 % (ref 0.0–0.2)

## 2020-03-02 MED ORDER — DIPHENHYDRAMINE HCL 25 MG PO CAPS
50.0000 mg | ORAL_CAPSULE | Freq: Once | ORAL | Status: AC
  Administered 2020-03-02: 50 mg via ORAL

## 2020-03-02 MED ORDER — ACETAMINOPHEN 325 MG PO TABS
ORAL_TABLET | ORAL | Status: AC
  Filled 2020-03-02: qty 2

## 2020-03-02 MED ORDER — DIPHENHYDRAMINE HCL 25 MG PO CAPS
ORAL_CAPSULE | ORAL | Status: AC
  Filled 2020-03-02: qty 2

## 2020-03-02 MED ORDER — ACETAMINOPHEN 325 MG PO TABS
650.0000 mg | ORAL_TABLET | Freq: Once | ORAL | Status: AC
  Administered 2020-03-02: 650 mg via ORAL

## 2020-03-02 MED ORDER — TRASTUZUMAB-HYALURONIDASE-OYSK 600-10000 MG-UNT/5ML ~~LOC~~ SOLN
600.0000 mg | Freq: Once | SUBCUTANEOUS | Status: AC
  Administered 2020-03-02: 600 mg via SUBCUTANEOUS
  Filled 2020-03-02: qty 5

## 2020-03-02 MED ORDER — SODIUM CHLORIDE 0.9 % IV SOLN
Freq: Once | INTRAVENOUS | Status: DC
  Filled 2020-03-02: qty 250

## 2020-03-02 NOTE — Telephone Encounter (Signed)
Scheduled appointments per 8/25 los. Patient is aware of appointments dates and times. Refused a print out, stated she would see appointments on MyChart.

## 2020-03-02 NOTE — Assessment & Plan Note (Addendum)
05/15/2019:Patient palpated a right breast lump for two years. Diagnostic mammogram showed a 4.5cm right breast mass at the 6 o'clock position involving the overlying skin, 2 benign appearing left breast mass at the 10 o'clock position measuring 2.4cm and 2.1cm, and no axillary adenopathy. 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 Maintenance Herceptin subcutaneously every three weeks x 1 year starting 12/09/2019 3.Adjuvant radiation beginning 12/31/2019 4.Follow-up adjuvant antiestrogen therapyBSO scheduled 7/31, currently on Zoladex, to start Anastrozole mid 02/2020 -----------------------------------------------------------------------------------------------------------------------------------------------  Herceptin:  Echo on 12/16/2019 EF: 55-60% Weight loss: following with [name] in nutrition, appetite has improved Anastrozole daily Dyspareunia: referral placed to PT I referred her to a cardio oncologists, as she is at increased risk for cardiac issues having received doxorubicin and herceptin.  They will review her echocardiograms and follow her every 3 months until her Herceptin is finished.    We will see her back with every other Herceptin.

## 2020-03-02 NOTE — Progress Notes (Signed)
[facility] Follow up:    Kimberly Panda, MD [address] [place] [address]   DIAGNOSIS: Cancer Staging Malignant neoplasm of lower-inner quadrant of right breast of female, estrogen receptor positive ([place]) Staging form: Breast, AJCC 8th Edition - Clinical stage from 05/27/2019: Stage IIA (cT2, cN0, cM0, G3, ER+, PR+, HER2: Equivocal) - Unsigned - Pathologic stage from 11/18/2019: No Stage Recommended (ypT1c, pN0, cM0, G3, ER+, PR+, HER2+) - Unsigned   SUMMARY OF ONCOLOGIC HISTORY: Oncology History  Malignant neoplasm of lower-inner quadrant of right breast of female, estrogen receptor positive ([place])  05/25/2019 Initial Diagnosis   Patient palpated a right breast lump for two years. Diagnostic mammogram showed a 4.5cm right breast mass at the 6 o'clock position involving the overlying skin, 2 benign appearing left breast mass at the 10 o'clock position measuring 2.4cm and 2.1cm, and no axillary adenopathy. Biopsy showed: in the left breast, fibroadenoma and no evidence of malignancy, and in the right breast, IDC with DCIS, grade 3.  ER 70%, PR 30%, Ki-67 40%, HER-2 negative by [facility]    06/10/2019 - 11/03/2019 Chemotherapy   The patient had DOXOrubicin (ADRIAMYCIN) chemo injection 118 mg, 60 mg/m2 = 118 mg, Intravenous,  Once, 4 of 4 cycles Dose modification: 50 mg/m2 (original dose 60 mg/m2, Cycle 2, Reason: Dose not tolerated) Administration: 118 mg (06/10/2019), 98 mg (06/24/2019), 98 mg (07/08/2019), 98 mg (07/22/2019) palonosetron (ALOXI) injection 0.25 mg, 0.25 mg, Intravenous,  Once, 4 of 4 cycles Administration: 0.25 mg (06/10/2019), 0.25 mg (06/24/2019), 0.25 mg (07/08/2019), 0.25 mg (07/22/2019) pegfilgrastim-cbqv (UDENYCA) injection 6 mg, 6 mg, Subcutaneous, Once, 4 of 4 cycles Administration: 6 mg (06/12/2019), 6 mg (06/26/2019), 6 mg (07/10/2019), 6 mg (07/24/2019) cyclophosphamide (CYTOXAN) 1,180 mg in sodium chloride 0.9 % 250 mL chemo infusion, 600 mg/m2 =  1,180 mg, Intravenous,  Once, 4 of 4 cycles Dose modification: 500 mg/m2 (original dose 600 mg/m2, Cycle 2, Reason: Dose not tolerated) Administration: 1,180 mg (06/10/2019), 980 mg (06/24/2019), 980 mg (07/08/2019), 980 mg (07/22/2019) PACLitaxel (TAXOL) 156 mg in sodium chloride 0.9 % 250 mL chemo infusion (</= 48m/m2), 80 mg/m2 = 156 mg, Intravenous,  Once, 12 of 12 cycles Dose modification: 65 mg/m2 (original dose 80 mg/m2, Cycle 11, Reason: Provider Judgment), 50 mg/m2 (original dose 80 mg/m2, Cycle 12, Reason: Dose not tolerated) Administration: 156 mg (08/12/2019), 156 mg (08/19/2019), 138 mg (08/26/2019), 138 mg (09/02/2019), 138 mg (09/09/2019), 138 mg (09/16/2019), 114 mg (09/23/2019), 90 mg (09/30/2019), 90 mg (10/07/2019), 90 mg (10/14/2019), 90 mg (10/21/2019), 90 mg (10/28/2019) fosaprepitant (EMEND) 150 mg, dexamethasone (DECADRON) 12 mg in sodium chloride 0.9 % 145 mL IVPB, , Intravenous,  Once, 4 of 4 cycles Administration:  (06/10/2019),  (06/24/2019),  (07/08/2019),  (07/22/2019)  for chemotherapy treatment.     Genetic Testing   No pathogenic variants identified. VUS in [place] c.[id number] identified on the [facility] Common Hereditary Cancers Panel. The report date is 06/15/2019.  The Common Hereditary Cancers Panel offered by [facility] includes sequencing and/or deletion duplication testing of the following 48 genes: APC, ATM, AXIN2, BARD1, BMPR1A, BRCA1, BRCA2, BRIP1, CDH1, CDKN2A (p14ARF), CDKN2A (p16INK4a), CKD4, CHEK2, CTNNA1, DICER1, EPCAM (Deletion/duplication testing only), GREM1 (promoter region deletion/duplication testing only), KIT, MEN1, MLH1, MSH2, MSH3, MSH6, MUTYH, NBN, NF1, NHTL1, PALB2, PDGFRA, PMS2, POLD1, POLE, PTEN, RAD50, RAD51C, RAD51D, RNF43, SDHB, SDHC, SDHD, SMAD4, SMARCA4. STK11, TP53, TSC1, TSC2, and VHL.  The following genes were evaluated for sequence changes only: SDHA and HOXB13 c.251G>A variant only.  11/18/2019 Surgery   Right lumpectomy Donne Hazel): IDC, grade 3,  1.5cm, with grade 2-3 DCIS, 2 right axillary lymph nodes negative. FISH on pathology revealed HER-2 positivity.   12/09/2019 -  Chemotherapy   The patient had trastuzumab-hyaluronidase-oysk (HERCEPTIN HYLECTA) 600-10000 MG-UNT/5ML chemo SQ injection 600 mg, 600 mg, Subcutaneous,  Once, 4 of 17 cycles Administration: 600 mg (12/09/2019), 600 mg (12/30/2019), 600 mg (01/20/2020), 600 mg (02/10/2020)  for chemotherapy treatment.    12/28/2019 - 02/12/2020 Radiation Therapy   Adjuvant radiation     CURRENT THERAPY:Herceptin  INTERVAL HISTORY: Kimberly Mueller 34 y.o. female returns for evaluation prior to receiving herceptin.  She finished adjuvant radiation therapy on 02/12/2020.  She notes she did fine, however this past week her fatigue has been worsening.  She is sleeping more and taking naps.  She is returning from [facility] vacation with her family last week.  She is scheduled for surgery on 8/31 for BSO.  She denies any new issues with the Herceptin.  Her most recent echo was completed on 12/16/2019 and showed ef of 55-60%. She has lost weight during her treatment and we obtained labs that were all normal, with a low normal B12 level.  She has had a bypass in the past and notes she received B12 injections monthly.  She says that her b12 level went very high with injections.  She notes her weight is stable, she is eating well, and is feeling much better than when she was having chemotherapy.     Patient Active Problem List   Diagnosis Date Noted  . Port-A-Cath in place 07/22/2019  . Genetic testing 06/15/2019  . Family history of prostate cancer   . Family history of pancreatic cancer   . Malignant neoplasm of lower-inner quadrant of right breast of female, estrogen receptor positive ([place]) 05/25/2019    is allergic to adhesive [tape].  MEDICAL HISTORY: Past Medical History:  Diagnosis Date  . Anxiety   . Breast cancer ([place])   . Cancer ([place])    breast  . Depression   . Family history of  pancreatic cancer   . Family history of prostate cancer   . GERD (gastroesophageal reflux disease)     SURGICAL HISTORY: Past Surgical History:  Procedure Laterality Date  . BREAST LUMPECTOMY WITH RADIOACTIVE SEED AND SENTINEL LYMPH NODE BIOPSY Right 11/18/2019   Procedure: RIGHT BREAST SEED GUIDED LUMPECTOMY, RIGHT AXILLARY SENTINEL LYMPH NODE BIOSPY;  Surgeon: Rolm Bookbinder, MD;  Location: [facility];  Service: General;  Laterality: Right;  GENERAL AND PECTORAL BLOCK  . FOREIGN BODY REMOVAL N/A 11/18/2019   Procedure: NEXPLANON REMOVAL;  Surgeon: Rolm Bookbinder, MD;  Location: [facility];  Service: General;  Laterality: N/A;  GENERAL AND PECTORAL BLOCK  . GASTRIC BYPASS    . HERNIA REPAIR    . PORT-A-CATH REMOVAL N/A 11/18/2019   Procedure: REMOVAL PORT-A-CATH;  Surgeon: Rolm Bookbinder, MD;  Location: [facility];  Service: General;  Laterality: N/A;  GENERAL AND PECTORAL BLOCK  . PORTACATH PLACEMENT Right 06/09/2019   Procedure: INSERTION PORT-A-CATH WITH ULTRASOUND;  Surgeon: Rolm Bookbinder, MD;  Location: [facility];  Service: General;  Laterality: Right;    SOCIAL HISTORY: Social History   Socioeconomic History  . Marital status: Married    Spouse name: Not on file  . Number of children: Not on file  . Years of education: Not on file  . Highest education level: Not on file  Occupational History  .  Not on file  Tobacco Use  . Smoking status: Former Research scientist (life sciences)  . Smokeless tobacco: Never Used  Vaping Use  . Vaping Use: Every day  Substance and Sexual Activity  . Alcohol use: Yes    Comment: occas.  . Drug use: Never  . Sexual activity: Not on file  Other Topics Concern  . Not on file  Social History Narrative  . Not on file   Social Determinants of Health   Financial Resource Strain:   . Difficulty of Paying Living Expenses: Not on file  Food Insecurity:   . Worried About Charity fundraiser in  the Last Year: Not on file  . Ran Out of Food in the Last Year: Not on file  Transportation Needs:   . Lack of Transportation (Medical): Not on file  . Lack of Transportation (Non-Medical): Not on file  Physical Activity:   . Days of Exercise per Week: Not on file  . Minutes of Exercise per Session: Not on file  Stress:   . Feeling of Stress : Not on file  Social Connections:   . Frequency of Communication with Friends and Family: Not on file  . Frequency of Social Gatherings with Friends and Family: Not on file  . Attends Religious Services: Not on file  . Active Member of Clubs or Organizations: Not on file  . Attends Archivist Meetings: Not on file  . Marital Status: Not on file  Intimate Partner Violence:   . Fear of Current or Ex-Partner: Not on file  . Emotionally Abused: Not on file  . Physically Abused: Not on file  . Sexually Abused: Not on file    FAMILY HISTORY: Family History  Problem Relation Age of Onset  . Prostate cancer Father   . Pancreatic cancer Paternal Grandmother     Review of Systems  Constitutional: Positive for fatigue. Negative for appetite change, chills, fever and unexpected weight change.  HENT:   Negative for hearing loss and lump/mass.   Eyes: Negative for eye problems and icterus.  Respiratory: Negative for chest tightness, cough and shortness of breath.   Cardiovascular: Negative for chest pain, leg swelling and palpitations.  Gastrointestinal: Negative for abdominal distention and abdominal pain.  Endocrine: Negative for hot flashes.  Genitourinary: Positive for dyspareunia.   Musculoskeletal: Negative for arthralgias.  Skin: Negative for itching and rash.  Neurological: Negative for dizziness, extremity weakness, headaches and numbness.  Hematological: Negative for adenopathy. Does not bruise/bleed easily.  Psychiatric/Behavioral: Negative for depression. The patient is not nervous/anxious.       PHYSICAL  EXAMINATION  ECOG PERFORMANCE STATUS: 1 - Symptomatic but completely ambulatory  Vitals:   03/02/20 0938  BP: 106/72  Pulse: 88  Resp: 18  Temp: (!) 97.4 F (36.3 C)  SpO2: 100%    Physical Exam Constitutional:      General: She is not in acute distress.    Appearance: Normal appearance. She is not toxic-appearing.  HENT:     Head: Normocephalic and atraumatic.  Eyes:     General: No scleral icterus. Cardiovascular:     Rate and Rhythm: Normal rate and regular rhythm.     Pulses: Normal pulses.     Heart sounds: Normal heart sounds.  Pulmonary:     Effort: Pulmonary effort is normal.     Breath sounds: Normal breath sounds.  Abdominal:     General: Abdomen is flat. Bowel sounds are normal.     Palpations: Abdomen is soft.  Skin:    General: Skin is warm and dry.     Capillary Refill: Capillary refill takes less than 2 seconds.     Findings: No rash.  Neurological:     General: No focal deficit present.     Mental Status: She is alert.  Psychiatric:        Mood and Affect: Mood normal.        Behavior: Behavior normal.     LABORATORY DATA:  CBC    Component Value Date/Time   WBC 2.7 (L) 03/02/2020 0910   WBC 2.4 (L) 02/29/2020 0826   WBC 2.3 (L) 02/29/2020 0826   RBC 4.36 03/02/2020 0910   HGB 13.2 03/02/2020 0910   HCT 38.6 03/02/2020 0910   PLT 192 03/02/2020 0910   MCV 88.5 03/02/2020 0910   MCH 30.3 03/02/2020 0910   MCHC 34.2 03/02/2020 0910   RDW 13.3 03/02/2020 0910   LYMPHSABS 0.7 03/02/2020 0910   MONOABS 0.3 03/02/2020 0910   EOSABS 0.1 03/02/2020 0910   BASOSABS 0.0 03/02/2020 0910    CMP     Component Value Date/Time   NA 140 03/02/2020 0910   K 3.9 03/02/2020 0910   CL 104 03/02/2020 0910   CO2 28 03/02/2020 0910   GLUCOSE 90 03/02/2020 0910   BUN 9 03/02/2020 0910   CREATININE 0.70 03/02/2020 0910   CALCIUM 10.3 03/02/2020 0910   PROT 7.0 03/02/2020 0910   ALBUMIN 4.2 03/02/2020 0910   AST 23 03/02/2020 0910   ALT 23  03/02/2020 0910   ALKPHOS 84 03/02/2020 0910   BILITOT 0.6 03/02/2020 0910   GFRNONAA >60 03/02/2020 0910   GFRAA >60 03/02/2020 0910       PENDING LABS:   RADIOGRAPHIC STUDIES:  No results found.   PATHOLOGY:     ASSESSMENT and THERAPY PLAN:   Malignant neoplasm of lower-inner quadrant of right breast of female, estrogen receptor positive ([place]) 05/15/2019:Patient palpated a right breast lump for two years. Diagnostic mammogram showed a 4.5cm right breast mass at the 6 o'clock position involving the overlying skin, 2 benign appearing left breast mass at the 10 o'clock position measuring 2.4cm and 2.1cm, and no axillary adenopathy. 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 Maintenance Herceptin subcutaneously every three weeks x 1 year starting 12/09/2019 3.Adjuvant radiation beginning 12/31/2019 4.Follow-up adjuvant antiestrogen therapyBSO scheduled 7/31, currently on Zoladex, to start Anastrozole mid 02/2020 -----------------------------------------------------------------------------------------------------------------------------------------------  Herceptin:  Echo on 12/16/2019 EF: 55-60% Weight loss: following with [name] in nutrition, appetite has improved Anastrozole daily Dyspareunia: referral placed to PT I referred her to a cardio oncologists, as she is at increased risk for cardiac issues having received  doxorubicin and herceptin.  They will review her echocardiograms and follow her every 3 months until her Herceptin is finished.    We will see her back with every other Herceptin.     Orders Placed This Encounter  Procedures  . Ambulatory referral to Physical Therapy    Referral Priority:  Routine    Referral Type:   Physical Medicine    Referral Reason:   Specialty Services Required    Requested Specialty:   Physical Therapy    Number of Visits Requested:   1  . ECHOCARDIOGRAM COMPLETE    Standing Status:   Future    Standing Expiration Date:   03/02/2021    Order Specific Question:   Where should this test be performed    Answer:   [facility]    Order Specific Question:   Perflutren DEFINITY (image enhancing agent) should be administered unless hypersensitivity or allergy exist    Answer:   Administer Perflutren    Order Specific Question:   Reason for exam-Echo    Answer:   Chemotherapy evaluation  v87.41 / v58.11    All questions were answered. The patient knows to call the clinic with any problems, questions or concerns. We can certainly see the patient much sooner if necessary.   Total encounter time: 20 minutes*  Wilber Bihari, NP 03/02/20 10:09 AM Medical Oncology and Hematology [facility] [place], [place] [address] Tel. [phone]    Fax. [phone]  *Total Encounter Time as defined by the Centers for Medicare and Medicaid Services includes, in addition to the face-to-face time of a patient visit (documented in the note above) non-face-to-face time: obtaining and reviewing outside history, ordering and reviewing medications, tests or procedures, care coordination (communications with other health care professionals or caregivers) and documentation in the medical record.

## 2020-03-02 NOTE — Patient Instructions (Signed)
[facility] Discharge Instructions for Patients Receiving Chemotherapy  Today you received the following chemotherapy agents Trastuzumab-hyaluronidase-oysk (HERCEPTIN HYLECTA).  To help prevent nausea and vomiting after your treatment, we encourage you to take your nausea medication as prescribed.  If you develop nausea and vomiting that is not controlled by your nausea medication, call the clinic.   BELOW ARE SYMPTOMS THAT SHOULD BE REPORTED IMMEDIATELY:  *FEVER GREATER THAN 100.5 F  *CHILLS WITH OR WITHOUT FEVER  NAUSEA AND VOMITING THAT IS NOT CONTROLLED WITH YOUR NAUSEA MEDICATION  *UNUSUAL SHORTNESS OF BREATH  *UNUSUAL BRUISING OR BLEEDING  TENDERNESS IN MOUTH AND THROAT WITH OR WITHOUT PRESENCE OF ULCERS  *URINARY PROBLEMS  *BOWEL PROBLEMS  UNUSUAL RASH Items with * indicate a potential emergency and should be followed up as soon as possible.  Feel free to call the clinic should you have any questions or concerns. The clinic phone number is [phone].  Please show the CHEMO ALERT CARD at check-in to the Emergency Department and triage nurse.   

## 2020-03-02 NOTE — Progress Notes (Signed)
Nutrition Follow-up:  Patient with right breast cancer, receiving herceptin q 3 weeks.  Last radiation treatment was on 8/6.  Planning bilateral salpingo oophorectomy on 8/31.    Met with patient during infusion.  Patient reports good appetite.  Has added back chicken and [place].  Still has not been able to add back yogurt.  Has been eating raisin bran for breakfast some mornings, snacking on trail mix, string cheese, drinking some premier protein shakes. Does not like avocados.    Denies any nutrition impact symptoms   Medications: reviewed  Labs: reviewed  Anthropometrics:   Weight increased to 117 lb today from 115 lb 4 oz on 8/4.  117 lb on 7/22 123 lb on 6/23 136 lb on 3/21 146 lb on 2/17 158 lb on 1/13 166 lb on 06/2019   NUTRITION DIAGNOSIS: Unintentional weight loss improving   INTERVENTION:  Congratulated patient on weight gain! Encouraged well balanced diet focusing on lean protein, including more plant foods and heart healthy fats to prevent weight loss.  Encouraged continued oral nutrition supplements (premier protein) Patient has contact information    MONITORING, EVALUATION, GOAL: weight trends, intake   NEXT VISIT: Sept 15 during infusion  Kimberly Mueller, [place], [place] Registered Dietitian [phone] (mobile)

## 2020-03-04 ENCOUNTER — Other Ambulatory Visit (HOSPITAL_COMMUNITY)
Admission: RE | Admit: 2020-03-04 | Discharge: 2020-03-04 | Disposition: A | Discharge status: Home / Self Care | Payer: No Typology Code available for payment source | Source: Ambulatory Visit | Attending: Gynecologic Oncology | Admitting: Gynecologic Oncology

## 2020-03-04 ENCOUNTER — Other Ambulatory Visit (HOSPITAL_COMMUNITY): Payer: No Typology Code available for payment source

## 2020-03-04 DIAGNOSIS — Z01812 Encounter for preprocedural laboratory examination: Secondary | ICD-10-CM | POA: Diagnosis not present

## 2020-03-04 DIAGNOSIS — Z20822 Contact with and (suspected) exposure to covid-19: Secondary | ICD-10-CM | POA: Insufficient documentation

## 2020-03-04 LAB — SARS CORONAVIRUS 2 (TAT 6-24 HRS): SARS Coronavirus 2: NEGATIVE

## 2020-03-07 ENCOUNTER — Telehealth: Payer: Self-pay

## 2020-03-07 NOTE — Anesthesia Preprocedure Evaluation (Addendum)
Anesthesia Evaluation  Patient identified by MRN, date of birth, ID band Patient awake    Reviewed: Allergy & Precautions, NPO status , Patient's Chart, lab work & pertinent test results  Airway Mallampati: I  TM Distance: >3 FB Neck ROM: Full    Dental  (+) Dental Advisory Given   Pulmonary former smoker,    breath sounds clear to auscultation       Cardiovascular negative cardio ROS   Rhythm:Regular Rate:Normal     Neuro/Psych negative neurological ROS     GI/Hepatic Neg liver ROS, GERD  Medicated,S/p gastric bypass   Endo/Other  negative endocrine ROS  Renal/GU negative Renal ROS     Musculoskeletal   Abdominal   Peds  Hematology negative hematology ROS (+)   Anesthesia Other Findings   Reproductive/Obstetrics                             Lab Results  Component Value Date   WBC 2.7 (L) 03/02/2020   HGB 13.2 03/02/2020   HCT 38.6 03/02/2020   MCV 88.5 03/02/2020   PLT 192 03/02/2020   Lab Results  Component Value Date   CREATININE 0.70 03/02/2020   BUN 9 03/02/2020   NA 140 03/02/2020   K 3.9 03/02/2020   CL 104 03/02/2020   CO2 28 03/02/2020    Anesthesia Physical Anesthesia Plan  ASA: II  Anesthesia Plan: General   Post-op Pain Management:    Induction: Intravenous  PONV Risk Score and Plan: 4 or greater and Scopolamine patch - Pre-op, Midazolam, Dexamethasone, Ondansetron and Treatment may vary due to age or medical condition  Airway Management Planned: Oral ETT  Additional Equipment: None  Intra-op Plan:   Post-operative Plan: Extubation in OR  Informed Consent: I have reviewed the patients History and Physical, chart, labs and discussed the procedure including the risks, benefits and alternatives for the proposed anesthesia with the patient or authorized representative who has indicated his/her understanding and acceptance.     Dental advisory  given  Plan Discussed with: CRNA  Anesthesia Plan Comments:        Anesthesia Quick Evaluation

## 2020-03-07 NOTE — Telephone Encounter (Signed)
Reviewed written instructions with Ms Geisler.  Answered questions to patient's satisfaction.

## 2020-03-08 ENCOUNTER — Ambulatory Visit (HOSPITAL_COMMUNITY): Payer: No Typology Code available for payment source | Admitting: Anesthesiology

## 2020-03-08 ENCOUNTER — Encounter (HOSPITAL_COMMUNITY): Payer: Self-pay | Admitting: Gynecologic Oncology

## 2020-03-08 ENCOUNTER — Encounter (HOSPITAL_COMMUNITY)
Admission: RE | Disposition: A | Discharge status: Home / Self Care | Payer: Self-pay | Source: Ambulatory Visit | Attending: Gynecologic Oncology

## 2020-03-08 ENCOUNTER — Ambulatory Visit (HOSPITAL_COMMUNITY)
Admission: RE | Admit: 2020-03-08 | Discharge: 2020-03-08 | Disposition: A | Discharge status: Home / Self Care | Payer: No Typology Code available for payment source | Source: Ambulatory Visit | Attending: Gynecologic Oncology | Admitting: Gynecologic Oncology

## 2020-03-08 DIAGNOSIS — Z79899 Other long term (current) drug therapy: Secondary | ICD-10-CM | POA: Insufficient documentation

## 2020-03-08 DIAGNOSIS — Z9884 Bariatric surgery status: Secondary | ICD-10-CM | POA: Diagnosis not present

## 2020-03-08 DIAGNOSIS — F419 Anxiety disorder, unspecified: Secondary | ICD-10-CM | POA: Diagnosis not present

## 2020-03-08 DIAGNOSIS — K219 Gastro-esophageal reflux disease without esophagitis: Secondary | ICD-10-CM | POA: Diagnosis not present

## 2020-03-08 DIAGNOSIS — Z170 Estrogen receptor positive status [ER+]: Secondary | ICD-10-CM | POA: Insufficient documentation

## 2020-03-08 DIAGNOSIS — Z302 Encounter for sterilization: Secondary | ICD-10-CM | POA: Diagnosis present

## 2020-03-08 DIAGNOSIS — Z793 Long term (current) use of hormonal contraceptives: Secondary | ICD-10-CM | POA: Diagnosis not present

## 2020-03-08 DIAGNOSIS — E669 Obesity, unspecified: Secondary | ICD-10-CM | POA: Diagnosis not present

## 2020-03-08 DIAGNOSIS — Z87891 Personal history of nicotine dependence: Secondary | ICD-10-CM | POA: Diagnosis not present

## 2020-03-08 DIAGNOSIS — C50911 Malignant neoplasm of unspecified site of right female breast: Secondary | ICD-10-CM | POA: Diagnosis not present

## 2020-03-08 DIAGNOSIS — C50311 Malignant neoplasm of lower-inner quadrant of right female breast: Secondary | ICD-10-CM | POA: Diagnosis not present

## 2020-03-08 DIAGNOSIS — Z6820 Body mass index (BMI) 20.0-20.9, adult: Secondary | ICD-10-CM | POA: Insufficient documentation

## 2020-03-08 DIAGNOSIS — F329 Major depressive disorder, single episode, unspecified: Secondary | ICD-10-CM | POA: Diagnosis not present

## 2020-03-08 DIAGNOSIS — C50919 Malignant neoplasm of unspecified site of unspecified female breast: Secondary | ICD-10-CM | POA: Diagnosis not present

## 2020-03-08 HISTORY — PX: ROBOTIC ASSISTED BILATERAL SALPINGO OOPHERECTOMY: SHX6078

## 2020-03-08 LAB — ABO/RH: ABO/RH(D): O POS

## 2020-03-08 LAB — TYPE AND SCREEN
ABO/RH(D): O POS
Antibody Screen: NEGATIVE

## 2020-03-08 LAB — HCG, SERUM, QUALITATIVE: Preg, Serum: NEGATIVE

## 2020-03-08 SURGERY — SALPINGO-OOPHORECTOMY, BILATERAL, ROBOT-ASSISTED
Anesthesia: General | Laterality: Bilateral

## 2020-03-08 MED ORDER — ALBUTEROL SULFATE HFA 108 (90 BASE) MCG/ACT IN AERS
INHALATION_SPRAY | RESPIRATORY_TRACT | Status: AC
  Filled 2020-03-08: qty 6.7

## 2020-03-08 MED ORDER — OXYCODONE HCL 5 MG PO TABS
ORAL_TABLET | ORAL | Status: AC
  Administered 2020-03-08: 10 mg via ORAL
  Filled 2020-03-08: qty 2

## 2020-03-08 MED ORDER — SODIUM CHLORIDE 0.9% FLUSH: 3.0000 mL | Freq: Two times a day (BID) | INTRAVENOUS | Status: DC

## 2020-03-08 MED ORDER — OXYCODONE HCL 5 MG PO TABS: 5.0000 mg | ORAL_TABLET | ORAL | Status: DC | PRN

## 2020-03-08 MED ORDER — MIDAZOLAM HCL 2 MG/2ML IJ SOLN
INTRAMUSCULAR | Status: AC
  Filled 2020-03-08: qty 2

## 2020-03-08 MED ORDER — DEXAMETHASONE SODIUM PHOSPHATE 10 MG/ML IJ SOLN
INTRAMUSCULAR | Status: AC
  Filled 2020-03-08: qty 1

## 2020-03-08 MED ORDER — FENTANYL CITRATE (PF) 250 MCG/5ML IJ SOLN
INTRAMUSCULAR | Status: AC
Start: 2020-03-08 — End: ?
  Filled 2020-03-08: qty 5

## 2020-03-08 MED ORDER — FENTANYL CITRATE (PF) 100 MCG/2ML IJ SOLN
INTRAMUSCULAR | Status: AC
  Filled 2020-03-08: qty 2

## 2020-03-08 MED ORDER — ONDANSETRON HCL 4 MG/2ML IJ SOLN
INTRAMUSCULAR | Status: DC | PRN
  Administered 2020-03-08: 4 mg via INTRAVENOUS

## 2020-03-08 MED ORDER — ACETAMINOPHEN 500 MG PO TABS
1000.0000 mg | ORAL_TABLET | ORAL | Status: AC
  Administered 2020-03-08: 1000 mg via ORAL
  Filled 2020-03-08: qty 2

## 2020-03-08 MED ORDER — DEXAMETHASONE SODIUM PHOSPHATE 10 MG/ML IJ SOLN
INTRAMUSCULAR | Status: DC | PRN
  Administered 2020-03-08: 10 mg via INTRAVENOUS

## 2020-03-08 MED ORDER — FENTANYL CITRATE (PF) 250 MCG/5ML IJ SOLN
INTRAMUSCULAR | Status: DC | PRN
Start: 2020-03-08 — End: 2020-03-08
  Administered 2020-03-08 (×2): 50 ug via INTRAVENOUS

## 2020-03-08 MED ORDER — LIDOCAINE 2% (20 MG/ML) 5 ML SYRINGE
INTRAMUSCULAR | Status: AC
  Filled 2020-03-08: qty 5

## 2020-03-08 MED ORDER — CEFAZOLIN SODIUM-DEXTROSE 2-4 GM/100ML-% IV SOLN
2.0000 g | INTRAVENOUS | Status: AC
  Administered 2020-03-08: 2 g via INTRAVENOUS
  Filled 2020-03-08: qty 100

## 2020-03-08 MED ORDER — AMISULPRIDE (ANTIEMETIC) 5 MG/2ML IV SOLN: 10.0000 mg | Freq: Once | INTRAVENOUS | Status: DC | PRN

## 2020-03-08 MED ORDER — LACTATED RINGERS IR SOLN
Status: DC | PRN
  Administered 2020-03-08: 1000 mL

## 2020-03-08 MED ORDER — ALBUTEROL SULFATE HFA 108 (90 BASE) MCG/ACT IN AERS
INHALATION_SPRAY | RESPIRATORY_TRACT | Status: DC | PRN
  Administered 2020-03-08: 2 via RESPIRATORY_TRACT

## 2020-03-08 MED ORDER — LIDOCAINE 2% (20 MG/ML) 5 ML SYRINGE
INTRAMUSCULAR | Status: DC | PRN
  Administered 2020-03-08: 60 mg via INTRAVENOUS

## 2020-03-08 MED ORDER — ORAL CARE MOUTH RINSE: 15.0000 mL | Freq: Once | OROMUCOSAL | Status: AC

## 2020-03-08 MED ORDER — PROPOFOL 10 MG/ML IV BOLUS
INTRAVENOUS | Status: AC
  Filled 2020-03-08: qty 20

## 2020-03-08 MED ORDER — DEXAMETHASONE SODIUM PHOSPHATE 4 MG/ML IJ SOLN: 4.0000 mg | INTRAMUSCULAR | Status: DC

## 2020-03-08 MED ORDER — PROPOFOL 10 MG/ML IV BOLUS
INTRAVENOUS | Status: DC | PRN
  Administered 2020-03-08: 20 mg via INTRAVENOUS
  Administered 2020-03-08: 100 mg via INTRAVENOUS

## 2020-03-08 MED ORDER — KETAMINE HCL 10 MG/ML IJ SOLN
INTRAMUSCULAR | Status: DC | PRN
  Administered 2020-03-08: 20 mg via INTRAVENOUS

## 2020-03-08 MED ORDER — SCOPOLAMINE 1 MG/3DAYS TD PT72
1.0000 | MEDICATED_PATCH | TRANSDERMAL | Status: DC
  Administered 2020-03-08: 1.5 mg via TRANSDERMAL
  Filled 2020-03-08: qty 1

## 2020-03-08 MED ORDER — CHLORHEXIDINE GLUCONATE 0.12 % MT SOLN
15.0000 mL | Freq: Once | OROMUCOSAL | Status: AC
  Administered 2020-03-08: 15 mL via OROMUCOSAL

## 2020-03-08 MED ORDER — LACTATED RINGERS IV SOLN: INTRAVENOUS | Status: DC

## 2020-03-08 MED ORDER — BUPIVACAINE LIPOSOME 1.3 % IJ SUSP
20.0000 mL | Freq: Once | INTRAMUSCULAR | Status: DC
  Filled 2020-03-08: qty 20

## 2020-03-08 MED ORDER — KETAMINE HCL 10 MG/ML IJ SOLN
INTRAMUSCULAR | Status: AC
  Filled 2020-03-08: qty 1

## 2020-03-08 MED ORDER — FENTANYL CITRATE (PF) 100 MCG/2ML IJ SOLN
25.0000 ug | INTRAMUSCULAR | Status: DC | PRN
  Administered 2020-03-08: 50 ug via INTRAVENOUS

## 2020-03-08 MED ORDER — BUPIVACAINE HCL 0.25 % IJ SOLN
INTRAMUSCULAR | Status: AC
  Filled 2020-03-08: qty 1

## 2020-03-08 MED ORDER — ROCURONIUM BROMIDE 10 MG/ML (PF) SYRINGE
PREFILLED_SYRINGE | INTRAVENOUS | Status: DC | PRN
  Administered 2020-03-08: 30 mg via INTRAVENOUS
  Administered 2020-03-08: 20 mg via INTRAVENOUS

## 2020-03-08 MED ORDER — STERILE WATER FOR IRRIGATION IR SOLN
Status: DC | PRN
  Administered 2020-03-08: 1000 mL

## 2020-03-08 MED ORDER — ONDANSETRON HCL 4 MG/2ML IJ SOLN
INTRAMUSCULAR | Status: AC
  Filled 2020-03-08: qty 2

## 2020-03-08 MED ORDER — ROCURONIUM BROMIDE 10 MG/ML (PF) SYRINGE
PREFILLED_SYRINGE | INTRAVENOUS | Status: AC
  Filled 2020-03-08: qty 10

## 2020-03-08 MED ORDER — GABAPENTIN 300 MG PO CAPS
300.0000 mg | ORAL_CAPSULE | ORAL | Status: AC
  Administered 2020-03-08: 300 mg via ORAL
  Filled 2020-03-08: qty 1

## 2020-03-08 MED ORDER — PHENYLEPHRINE 40 MCG/ML (10ML) SYRINGE FOR IV PUSH (FOR BLOOD PRESSURE SUPPORT)
PREFILLED_SYRINGE | INTRAVENOUS | Status: DC | PRN
  Administered 2020-03-08: 80 ug via INTRAVENOUS
  Administered 2020-03-08: 40 ug via INTRAVENOUS

## 2020-03-08 MED ORDER — BUPIVACAINE HCL 0.25 % IJ SOLN
INTRAMUSCULAR | Status: DC | PRN
  Administered 2020-03-08: 16 mL

## 2020-03-08 MED ORDER — SUGAMMADEX SODIUM 200 MG/2ML IV SOLN
INTRAVENOUS | Status: DC | PRN
  Administered 2020-03-08: 200 mg via INTRAVENOUS

## 2020-03-08 MED ORDER — MIDAZOLAM HCL 5 MG/5ML IJ SOLN
INTRAMUSCULAR | Status: DC | PRN
  Administered 2020-03-08: 2 mg via INTRAVENOUS

## 2020-03-08 SURGICAL SUPPLY — 70 items
ADH SKN CLS APL DERMABOND .7 (GAUZE/BANDAGES/DRESSINGS) ×1
AGENT HMST KT MTR STRL THRMB (HEMOSTASIS)
APL ESCP 34 STRL LF DISP (HEMOSTASIS)
APPLICATOR SURGIFLO ENDO (HEMOSTASIS) IMPLANT
BACTOSHIELD CHG 4% 4OZ (MISCELLANEOUS) ×1
BAG LAPAROSCOPIC 12 15 PORT 16 (BASKET) IMPLANT
BAG RETRIEVAL 12/15 (BASKET)
BAG SPEC RTRVL LRG 6X4 10 (ENDOMECHANICALS) ×2
BLADE HEX COATED 2.75 (ELECTRODE) ×2 IMPLANT
BLADE SURG SZ10 CARB STEEL (BLADE) IMPLANT
COVER BACK TABLE 60X90IN (DRAPES) ×2 IMPLANT
COVER TIP SHEARS 8 DVNC (MISCELLANEOUS) ×1 IMPLANT
COVER TIP SHEARS 8MM DA VINCI (MISCELLANEOUS) ×1
COVER WAND RF STERILE (DRAPES) IMPLANT
DECANTER SPIKE VIAL GLASS SM (MISCELLANEOUS) IMPLANT
DERMABOND ADVANCED (GAUZE/BANDAGES/DRESSINGS) ×1
DERMABOND ADVANCED .7 DNX12 (GAUZE/BANDAGES/DRESSINGS) ×1 IMPLANT
DRAPE ARM DVNC X/XI (DISPOSABLE) ×4 IMPLANT
DRAPE COLUMN DVNC XI (DISPOSABLE) ×1 IMPLANT
DRAPE DA VINCI XI ARM (DISPOSABLE) ×4
DRAPE DA VINCI XI COLUMN (DISPOSABLE) ×1
DRAPE SHEET LG 3/4 BI-LAMINATE (DRAPES) ×2 IMPLANT
DRAPE SURG IRRIG POUCH 19X23 (DRAPES) ×2 IMPLANT
DRSG OPSITE POSTOP 4X6 (GAUZE/BANDAGES/DRESSINGS) IMPLANT
DRSG OPSITE POSTOP 4X8 (GAUZE/BANDAGES/DRESSINGS) IMPLANT
ELECT PENCIL ROCKER SW 15FT (MISCELLANEOUS) ×2 IMPLANT
ELECT REM PT RETURN 15FT ADLT (MISCELLANEOUS) ×2 IMPLANT
GLOVE BIO SURGEON STRL SZ 6 (GLOVE) ×8 IMPLANT
GLOVE BIO SURGEON STRL SZ 6.5 (GLOVE) ×4 IMPLANT
GOWN STRL REUS W/ TWL LRG LVL3 (GOWN DISPOSABLE) ×4 IMPLANT
GOWN STRL REUS W/TWL LRG LVL3 (GOWN DISPOSABLE) ×8
HOLDER FOLEY CATH W/STRAP (MISCELLANEOUS) ×2 IMPLANT
IRRIG SUCT STRYKERFLOW 2 WTIP (MISCELLANEOUS) ×2
IRRIGATION SUCT STRKRFLW 2 WTP (MISCELLANEOUS) ×1 IMPLANT
KIT PROCEDURE DA VINCI SI (MISCELLANEOUS)
KIT PROCEDURE DVNC SI (MISCELLANEOUS) IMPLANT
KIT TURNOVER KIT A (KITS) IMPLANT
MANIPULATOR UTERINE 4.5 ZUMI (MISCELLANEOUS) ×2 IMPLANT
NEEDLE HYPO 22GX1.5 SAFETY (NEEDLE) ×2 IMPLANT
NEEDLE SPNL 18GX3.5 QUINCKE PK (NEEDLE) IMPLANT
OBTURATOR OPTICAL STANDARD 8MM (TROCAR) ×1
OBTURATOR OPTICAL STND 8 DVNC (TROCAR) ×1
OBTURATOR OPTICALSTD 8 DVNC (TROCAR) ×1 IMPLANT
PACK ROBOT GYN CUSTOM WL (TRAY / TRAY PROCEDURE) ×2 IMPLANT
PAD POSITIONING PINK XL (MISCELLANEOUS) ×2 IMPLANT
PENCIL SMOKE EVACUATOR (MISCELLANEOUS) IMPLANT
PORT ACCESS TROCAR AIRSEAL 12 (TROCAR) ×1 IMPLANT
PORT ACCESS TROCAR AIRSEAL 5M (TROCAR) ×1
POUCH SPECIMEN RETRIEVAL 10MM (ENDOMECHANICALS) ×4 IMPLANT
SCRUB CHG 4% DYNA-HEX 4OZ (MISCELLANEOUS) ×1 IMPLANT
SEAL CANN UNIV 5-8 DVNC XI (MISCELLANEOUS) ×3 IMPLANT
SEAL XI 5MM-8MM UNIVERSAL (MISCELLANEOUS) ×3
SET TRI-LUMEN FLTR TB AIRSEAL (TUBING) ×2 IMPLANT
SPONGE LAP 18X18 RF (DISPOSABLE) IMPLANT
SURGIFLO W/THROMBIN 8M KIT (HEMOSTASIS) IMPLANT
SUT MNCRL AB 4-0 PS2 18 (SUTURE) IMPLANT
SUT PDS AB 1 TP1 96 (SUTURE) IMPLANT
SUT VIC AB 0 CT1 27 (SUTURE)
SUT VIC AB 0 CT1 27XBRD ANTBC (SUTURE) IMPLANT
SUT VIC AB 2-0 CT1 27 (SUTURE)
SUT VIC AB 2-0 CT1 TAPERPNT 27 (SUTURE) IMPLANT
SUT VICRYL 4-0 PS2 18IN ABS (SUTURE) ×4 IMPLANT
SYR 10ML LL (SYRINGE) IMPLANT
TOWEL OR NON WOVEN STRL DISP B (DISPOSABLE) ×2 IMPLANT
TRAP SPECIMEN MUCUS 40CC (MISCELLANEOUS) IMPLANT
TRAY FOLEY MTR SLVR 16FR STAT (SET/KITS/TRAYS/PACK) ×2 IMPLANT
TROCAR XCEL NON-BLD 5MMX100MML (ENDOMECHANICALS) IMPLANT
UNDERPAD 30X36 HEAVY ABSORB (UNDERPADS AND DIAPERS) ×2 IMPLANT
WATER STERILE IRR 1000ML POUR (IV SOLUTION) ×2 IMPLANT
YANKAUER SUCT BULB TIP 10FT TU (MISCELLANEOUS) IMPLANT

## 2020-03-08 NOTE — Anesthesia Procedure Notes (Signed)
Procedure Name: Intubation Date/Time: 03/08/2020 7:43 AM Performed by: Talbot Grumbling, CRNA Pre-anesthesia Checklist: Patient identified, Emergency Drugs available, Suction available and Patient being monitored Patient Re-evaluated:Patient Re-evaluated prior to induction Oxygen Delivery Method: Circle system utilized Preoxygenation: Pre-oxygenation with 100% oxygen Induction Type: IV induction Ventilation: Mask ventilation without difficulty Laryngoscope Size: Mac and 3 Grade View: Grade I Tube type: Oral Tube size: 7.0 mm Number of attempts: 1 Airway Equipment and Method: Stylet Placement Confirmation: ETT inserted through vocal cords under direct vision,  positive ETCO2 and breath sounds checked- equal and bilateral Secured at: 21 cm Tube secured with: Tape Dental Injury: Teeth and Oropharynx as per pre-operative assessment

## 2020-03-08 NOTE — Anesthesia Postprocedure Evaluation (Signed)
Anesthesia Post Note  Patient: Kimberly Mueller  Procedure(s) Performed: XI ROBOTIC ASSISTED BILATERAL SALPINGO OOPHORECTOMY (Bilateral )     Patient location during evaluation: PACU Anesthesia Type: General Level of consciousness: awake and alert Pain management: pain level controlled Vital Signs Assessment: post-procedure vital signs reviewed and stable Respiratory status: spontaneous breathing, nonlabored ventilation, respiratory function stable and patient connected to nasal cannula oxygen Cardiovascular status: blood pressure returned to baseline and stable Postop Assessment: no apparent nausea or vomiting Anesthetic complications: no   No complications documented.  Last Vitals:  Vitals:   03/08/20 1022 03/08/20 1108  BP: 120/85   Pulse: 88 73  Resp: 12 14  Temp: (!) 36.4 C   SpO2: 98% 100%    Last Pain:  Vitals:   03/08/20 1108  TempSrc:   PainSc: 7                  Tiajuana Amass

## 2020-03-08 NOTE — Transfer of Care (Signed)
Immediate Anesthesia Transfer of Care Note  Patient: Kimberly Mueller  Procedure(s) Performed: XI ROBOTIC ASSISTED BILATERAL SALPINGO OOPHORECTOMY (Bilateral )  Patient Location: PACU  Anesthesia Type:General  Level of Consciousness: awake, alert  and oriented  Airway & Oxygen Therapy: Patient Spontanous Breathing and Patient connected to face mask oxygen  Post-op Assessment: Report given to RN and Post -op Vital signs reviewed and stable  Post vital signs: Reviewed and stable  Last Vitals:  Vitals Value Taken Time  BP    Temp    Pulse 91 03/08/20 0909  Resp 11 03/08/20 0909  SpO2 100 % 03/08/20 0909  Vitals shown include unvalidated device data.  Last Pain:  Vitals:   03/08/20 0558  TempSrc:   PainSc: 0-No pain         Complications: No complications documented.

## 2020-03-08 NOTE — H&P (Signed)
H&P Note: Gyn-Onc  Consult was requested by Dr. Lindi Adie for the evaluation of Kimberly Mueller 34 y.o. female  CC:  ER positive breast cancer desires surgical castration  Assessment/Plan:  [name]  is a 34 y.o.  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 Currently receiving goserelin injections. Desires permanent surgical castration for reduction in breast cancer recurrence risk. She understands the associated permanent sterility associated with this.  We are planning surgery with robotic BSO.  I discussed surgical risk including  bleeding, infection, damage to internal organs (such as bladder,ureters, bowels), blood clot, reoperation and rehospitalization. I discussed anticipated surgical recovery, time off of work (2 weeks) and restrictions.   HPI: [name] is a very pleasant 34 year old G0 who was seen in consultation at the request of Dr Lindi Adie for evaluation of hormone receptor positive breast cancer desiring chemical versus surgical castration.  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  Treatment plan was initiated with neoadjuvant chemotherapy with Adriamycin Cytoxan and Taxol.  She is currently undergoing the Taxol portion of this.  Plan is for surgical excision followed by radiation after chemotherapy.  She underwent genetic testing on June 15, 2019 which was negative for BRCA mutations.  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  The patient is otherwise very healthy.  She has a history of obesity which was treated with a laparoscopic gastric bypass in [place] in 2020.  She lost 90 pounds after this.  She is of normal body weight with a BMI of 23.  The patient has had no other prior abdominal surgeries besides this gastric bypass.  She has never been pregnant.  She is somewhat ambivalent about children but decided after  her breast cancer diagnosis that she would unlikely ever try for pregnancy given the hormone receptor positive nature of her breast cancer.  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  She has been vaccinated against HPV.  Interval Hx:   The patient has completed chemotherapy and surgery for her breast cancer. She has planned radiation to begin in June/July.  She desires definitive surgical castration. She is currently using Herceptin and will be on this for extended therapy. She is receiving goserelin injections and has had her implanon removed.   Current Meds:  Outpatient Encounter Medications as of 12/18/2019  Medication Sig  . bimatoprost (LATISSE) 0.03 % ophthalmic solution Place 1 application into both eyes at bedtime. Place one drop on applicator and apply evenly along the skin of the upper eyelid at base of eyelashes once daily at bedtime; repeat procedure for second eye (use a clean applicator).  Marland Kitchen docusate sodium (COLACE) 250 MG capsule Take 250 mg by mouth 2 (two) times daily.  Marland Kitchen etonogestrel (NEXPLANON) 68 MG IMPL implant 1 each by Subdermal route once.  . gabapentin (NEURONTIN) 100 MG capsule Take 1 capsule (100 mg total) by mouth at bedtime.  Marland Kitchen goserelin (ZOLADEX) 3.6 MG injection Inject 3.6 mg into the skin every 28 (twenty-eight) days.  . Lactobacillus (PROBIOTIC ACIDOPHILUS) CAPS Take 1 capsule by mouth daily.  . NON FORMULARY [employer] Signature sleep aide  . omeprazole (PRILOSEC) 40 MG capsule Take 1 capsule (40 mg total) by mouth daily.  Marland Kitchen oxyCODONE (OXY IR/ROXICODONE) 5 MG immediate release tablet Take  1 tablet (5 mg total) by mouth every 6 (six) hours as needed.  . Sennosides (LAXATIVE) 25 MG TABS Take 1 tablet by mouth 2 (two) times daily as needed (constipation).  . simethicone (MYLICON) 80 MG chewable tablet Chew 80 mg by mouth every 6 (six) hours as needed for  flatulence.  . venlafaxine XR (EFFEXOR-XR) 37.5 MG 24 hr capsule Take 1 capsule (37.5 mg total) by mouth daily with breakfast.  . vortioxetine HBr (TRINTELLIX) 5 MG TABS tablet Take 5 mg by mouth daily.  . [DISCONTINUED] prochlorperazine (COMPAZINE) 10 MG tablet Take 1 tablet (10 mg total) by mouth every 6 (six) hours as needed (Nausea or vomiting).   No facility-administered encounter medications on file as of 12/18/2019.    Allergy:  Allergies  Allergen Reactions  . Adhesive [Tape]     Sensitivity to tape (not sure what kind)    Social Hx:   Social History   Socioeconomic History  . Marital status: Married    Spouse name: Not on file  . Number of children: Not on file  . Years of education: Not on file  . Highest education level: Not on file  Occupational History  . Not on file  Tobacco Use  . Smoking status: Former Research scientist (life sciences)  . Smokeless tobacco: Never Used  Vaping Use  . Vaping Use: Every day  Substance and Sexual Activity  . Alcohol use: Yes    Comment: occas.  . Drug use: Never  . Sexual activity: Not on file  Other Topics Concern  . Not on file  Social History Narrative  . Not on file   Social Determinants of Health   Financial Resource Strain:   . Difficulty of Paying Living Expenses: Not on file  Food Insecurity:   . Worried About Charity fundraiser in the Last Year: Not on file  . Ran Out of Food in the Last Year: Not on file  Transportation Needs:   . Lack of Transportation (Medical): Not on file  . Lack of Transportation (Non-Medical): Not on file  Physical Activity:   . Days of Exercise per Week: Not on file  . Minutes of Exercise per Session: Not on file  Stress:   . Feeling of Stress : Not on file  Social Connections:   . Frequency of Communication with Friends and Family: Not on file  . Frequency of Social Gatherings with Friends and Family: Not on file  . Attends Religious Services: Not on file  . Active Member of Clubs or Organizations: Not  on file  . Attends Archivist Meetings: Not on file  . Marital Status: Not on file  Intimate Partner Violence:   . Fear of Current or Ex-Partner: Not on file  . Emotionally Abused: Not on file  . Physically Abused: Not on file  . Sexually Abused: Not on file    Past Surgical Hx:  Past Surgical History:  Procedure Laterality Date  . BREAST LUMPECTOMY WITH RADIOACTIVE SEED AND SENTINEL LYMPH NODE BIOPSY Right 11/18/2019   Procedure: RIGHT BREAST SEED GUIDED LUMPECTOMY, RIGHT AXILLARY SENTINEL LYMPH NODE BIOSPY;  Surgeon: Rolm Bookbinder, MD;  Location: [facility];  Service: General;  Laterality: Right;  GENERAL AND PECTORAL BLOCK  . FOREIGN BODY REMOVAL N/A 11/18/2019   Procedure: NEXPLANON REMOVAL;  Surgeon: Rolm Bookbinder, MD;  Location: [facility];  Service: General;  Laterality: N/A;  GENERAL AND PECTORAL BLOCK  . GASTRIC BYPASS    . HERNIA REPAIR    .  PORT-A-CATH REMOVAL N/A 11/18/2019   Procedure: REMOVAL PORT-A-CATH;  Surgeon: Rolm Bookbinder, MD;  Location: [facility];  Service: General;  Laterality: N/A;  GENERAL AND PECTORAL BLOCK  . PORTACATH PLACEMENT Right 06/09/2019   Procedure: INSERTION PORT-A-CATH WITH ULTRASOUND;  Surgeon: Rolm Bookbinder, MD;  Location: [facility];  Service: General;  Laterality: Right;    Past Medical Hx:  Past Medical History:  Diagnosis Date  . Anxiety   . Breast cancer ([place])   . Cancer ([place])    breast  . Depression   . Family history of pancreatic cancer   . Family history of prostate cancer   . GERD (gastroesophageal reflux disease)     Past Gynecological History:   No LMP recorded. (Menstrual status: Other).  Family Hx:  Family History  Problem Relation Age of Onset  . Prostate cancer Father   . Pancreatic cancer Paternal Grandmother     Review of Systems:  Constitutional  Feels well,    ENT Normal appearing ears and nares bilaterally Skin/Breast   No rash, sores, jaundice, itching, dryness Cardiovascular  No chest pain, shortness of breath, or edema  Pulmonary  No cough or wheeze.  Gastro Intestinal  No nausea, vomitting, or diarrhoea. No bright red blood per rectum, no abdominal pain, change in bowel movement, +constipation.  Genito Urinary  No frequency, urgency, dysuria, no bleeding Musculo Skeletal  No myalgia, arthralgia, joint swelling or pain  Neurologic  No weakness, numbness, change in gait,  Psychology  No depression, anxiety, insomnia.   Vitals:  Blood pressure 110/74, pulse 66, temperature 98 F (36.7 C), temperature source Oral, resp. rate 15, height '5\' 3"'  (1.6 m), weight 117 lb (53.1 kg), SpO2 100 %.  Physical Exam: WD in NAD Neck  Supple NROM, without any enlargements.  Lymph Node Survey No cervical supraclavicular or inguinal adenopathy Cardiovascular  Pulse normal rate, regularity and rhythm. S1 and S2 normal.  Lungs  Clear to auscultation bilateraly, without wheezes/crackles/rhonchi. Good air movement.  Skin  No rash/lesions/breakdown  Psychiatry  Alert and oriented to person, place, and time  Abdomen  Normoactive bowel sounds, abdomen soft, non-tender and nonobese without evidence of hernia.  Back No CVA tenderness Genito Urinary  Vulva/vagina: Normal external female genitalia.  No lesions. No discharge or bleeding.  Bladder/urethra:  No lesions or masses, well supported bladder  Vagina: normal, narrow, atrophic  Cervix: Normal appearing, no lesions.  Uterus:  Small, mobile, no parametrial involvement or nodularity.  Adnexa: no palpable masses. Rectal  deferred Extremities  No bilateral cyanosis, clubbing or edema.   Thereasa Solo, MD  03/08/2020, 7:18 AM

## 2020-03-08 NOTE — Discharge Instructions (Signed)
03/08/2020  Return to work: 2 weeks  Activity: 1. Be up and out of the bed during the day.  Take a nap if needed.  You may walk up steps but be careful and use the hand rail.  Stair climbing will tire you more than you think, you may need to stop part way and rest.   2. No lifting or straining for 4 weeks.  3. No driving for 1 week.  Do Not drive if you are taking narcotic pain medicine.  4. Shower daily.  Use soap and water on your incision and pat dry; don't rub.   5. No sexual activity and nothing in the vagina for 4 weeks.  Medications:  - Take ibuprofen and tylenol first line for pain control. Take these regularly (every 6 hours) to decrease the build up of pain.  - If necessary, for severe pain not relieved by ibuprofen, take oxycodone.  - While taking oxycodone you should take sennakot every night to reduce the likelihood of constipation. If this causes diarrhea, stop its use.  Diet: 1. Low sodium Heart Healthy Diet is recommended.  2. It is safe to use a laxative if you have difficulty moving your bowels.   Wound Care: 1. Keep clean and dry.  Shower daily.  Reasons to call the Doctor:   Fever - Oral temperature greater than 100.4 degrees Fahrenheit  Foul-smelling vaginal discharge  Difficulty urinating  Nausea and vomiting  Increased pain at the site of the incision that is unrelieved with pain medicine.  Difficulty breathing with or without chest pain  New calf pain especially if only on one side  Sudden, continuing increased vaginal bleeding with or without clots.   Follow-up: 1. See Everitt Amber in 3 weeks.  Contacts: For questions or concerns you should contact:  Dr. Everitt Amber at [phone] After hours and on week-ends call [phone] and ask to speak to the physician on call for Gynecologic Oncology     General Anesthesia, Adult, Care After This sheet gives you information about how to care for yourself after your procedure. Your health  care provider may also give you more specific instructions. If you have problems or questions, contact your health care provider. What can I expect after the procedure? After the procedure, the following side effects are common:  Pain or discomfort at the IV site.  Nausea.  Vomiting.  Sore throat.  Trouble concentrating.  Feeling cold or chills.  Weak or tired.  Sleepiness and fatigue.  Soreness and body aches. These side effects can affect parts of the body that were not involved in surgery. Follow these instructions at home:  For at least 24 hours after the procedure:  Have a responsible adult stay with you. It is important to have someone help care for you until you are awake and alert.  Rest as needed.  Do not: ? Participate in activities in which you could fall or become injured. ? Drive. ? Use heavy machinery. ? Drink alcohol. ? Take sleeping pills or medicines that cause drowsiness. ? Make important decisions or sign legal documents. ? Take care of children on your own. Eating and drinking  Follow any instructions from your health care provider about eating or drinking restrictions.  When you feel hungry, start by eating small amounts of foods that are soft and easy to digest (bland), such as toast. Gradually return to your regular diet.  Drink enough fluid to keep your urine pale yellow.  If you vomit,  rehydrate by drinking water, juice, or clear broth. General instructions  If you have sleep apnea, surgery and certain medicines can increase your risk for breathing problems. Follow instructions from your health care provider about wearing your sleep device: ? Anytime you are sleeping, including during daytime naps. ? While taking prescription pain medicines, sleeping medicines, or medicines that make you drowsy.  Return to your normal activities as told by your health care provider. Ask your health care provider what activities are safe for you.  Take  over-the-counter and prescription medicines only as told by your health care provider.  If you smoke, do not smoke without supervision.  Keep all follow-up visits as told by your health care provider. This is important. Contact a health care provider if:  You have nausea or vomiting that does not get better with medicine.  You cannot eat or drink without vomiting.  You have pain that does not get better with medicine.  You are unable to pass urine.  You develop a skin rash.  You have a fever.  You have redness around your IV site that gets worse. Get help right away if:  You have difficulty breathing.  You have chest pain.  You have blood in your urine or stool, or you vomit blood. Summary  After the procedure, it is common to have a sore throat or nausea. It is also common to feel tired.  Have a responsible adult stay with you for the first 24 hours after general anesthesia. It is important to have someone help care for you until you are awake and alert.  When you feel hungry, start by eating small amounts of foods that are soft and easy to digest (bland), such as toast. Gradually return to your regular diet.  Drink enough fluid to keep your urine pale yellow.  Return to your normal activities as told by your health care provider. Ask your health care provider what activities are safe for you. This information is not intended to replace advice given to you by your health care provider. Make sure you discuss any questions you have with your health care provider. Document Revised: 06/28/2017 Document Reviewed: 02/08/2017 Elsevier Patient Education  [place].   Bilateral Salpingo-Oophorectomy, Care After This sheet gives you information about how to care for yourself after your procedure. Your health care provider may also give you more specific instructions. If you have problems or questions, contact your health care provider. What can I expect after the  procedure? After the procedure, it is common to have:  Abdominal pain.  Some occasional vaginal bleeding (spotting).  Tiredness.  Symptoms of menopause, such as hot flashes, night sweats, or mood swings. Follow these instructions at home: Incision care   Keep your incision area and your bandage (dressing) clean and dry.  Follow instructions from your health care provider about how to take care of your incision. Make sure you: ? Wash your hands with soap and water before you change your dressing. If soap and water are not available, use hand sanitizer. ? Change your dressing as told by your health care provider. ? Leave stitches (sutures), staples, skin glue, or adhesive strips in place. These skin closures may need to stay in place for 2 weeks or longer. If adhesive strip edges start to loosen and curl up, you may trim the loose edges. Do not remove adhesive strips completely unless your health care provider tells you to do that.  Check your incision area every day for  signs of infection. Check for: ? Redness, swelling, or pain. ? Fluid or blood. ? Warmth. ? Pus or a bad smell. Activity   Do not drive or use heavy machinery while taking prescription pain medicine.  Do not drive for 24 hours if you received a medicine to help you relax (sedative) during your procedure.  Take frequent, short walks throughout the day. Rest when you get tired. Ask your health care provider what activities are safe for you.  Avoid activity that requires great effort. Also, avoid heavy lifting. Do not lift anything that is heavier than 10 lbs. (4.5 kg), or the limit that your health care provider tells you, until he or she says that it is safe to do so.  Do not douche, use tampons, or have sex until your health care provider approves. General instructions   To prevent or treat constipation while you are taking prescription pain medicine, your health care provider may recommend that you: ? Drink  enough fluid to keep your urine clear or pale yellow. ? Take over-the-counter or prescription medicines. ? Eat foods that are high in fiber, such as fresh fruits and vegetables, whole grains, and beans. ? Limit foods that are high in fat and processed sugars, such as fried and sweet foods.  Take over-the-counter and prescription medicines only as told by your health care provider.  Do not take baths, swim, or use a hot tub until your health care provider approves. Ask your health care provider if you can take showers. You may only be allowed to take sponge baths for bathing.  Wear compression stockings as told by your health care provider. These stockings help to prevent blood clots and reduce swelling in your legs.  Keep all follow-up visits as told by your health care provider. This is important. Contact a health care provider if:  You have pain when you urinate.  You have pus or a bad smelling discharge coming from your vagina.  You have redness, swelling, or pain around your incision.  You have fluid or blood coming from your incision.  Your incision feels warm to the touch.  You have pus or a bad smell coming from your incision.  You have a fever.  Your incision starts to break open.  You have pain in the abdomen, and it gets worse or does not get better when you take medicine.  You develop a rash.  You develop nausea and vomiting.  You feel lightheaded. Get help right away if:  You develop pain in your chest or leg.  You become short of breath.  You faint.  You have increased bleeding from your vagina. Summary  After the procedure, it is common to have pain, bleeding in the vagina, and symptoms of menopause.  Follow instructions from your health care provider about how to take care of your incision.  Follow instructions from your health care provider about activities and restrictions.  Check your incision every day for signs of infection and report any  symptoms to your health care provider. This information is not intended to replace advice given to you by your health care provider. Make sure you discuss any questions you have with your health care provider. Document Revised: 08/29/2018 Document Reviewed: 07/30/2016 Elsevier Patient Education  2020 [employer].

## 2020-03-08 NOTE — Op Note (Signed)
OPERATIVE NOTE  Date: 03/08/20  Preoperative Diagnosis: ER positive breast cancer desires surgical castration   Postoperative Diagnosis:  same  Procedure(s) Performed: Robotic-assisted laparoscopic bilateral salpingo-oophorectomy  Surgeon: Everitt Amber, M.D.  Assistant Surgeon: Lahoma Crocker M.D. (an MD assistant was necessary for tissue manipulation, management of robotic instrumentation, retraction and positioning due to the complexity of the case and hospital policies).   Anesthesia: Gen. endotracheal.  Specimens: Bilateral ovaries, fallopian tubes.  Estimated Blood Loss: 10 mL. Blood Replacement: None  Complications: none  Indication for Procedure:  History of estrogen receptor positive breast cancer, needs castration for reduced recurrence risk  Operative Findings: atrophic 6cm uterus and normal appearing atrophic ovaries. Normal tubes.  Procedure: The patient's taken to the operating room and placed under general endotracheal anesthesia testing difficulty. She is placed in a dorsolithotomy position and cervical acromial pad was placed. The arms were tucked with care taken to pad the olecranon process. And prepped and draped in usual sterile fashion. A uterine manipulator (zumi) was placed vaginally. A 72mm incision was made in the left upper quadrant palmer's point and a 5 mm Optiview trocar used to enter the abdomen under direct visualization. With entry into the abdomen and then maintenance of 15 mm of mercury the patient was placed in Trendelenburg position. An incision was made in the umbilicus and a 74JO trochar was placed through this site. Two incisions were made lateral to the umbilical incision in the left and right abdomen measuring 35mm. These incisions were made approximately 10 cm lateral to the umbilical incision. 8 mm robotic trochars were inserted. The robot was docked.  The abdomen was inspected as was the pelvis.  Pelvic washings were obtained. An incision was made  on the right pelvic side wall peritoneum parallel to the IP ligament and the retroperitoneal space entered. The right ureter was identified and the para-rectal space was developed. A window was created in the right broad ligament above the ureter. The right infundibulopelvic vessels were skeletonized cauterized and transected. The utero-ovarian ligaments similarly were cauterized and transected. Specimen was placed in an Endo Catch bag.  In a similar manner the left peritoneum and the side wall was incised, and the retroperitoneal space entered. The left ureter was identified and the left pararectal space was developed. The utero-ovarian ligament was skeletonized cauterized and transected. The left utero-ovarian ligaments were cauterized and transected in the left adnexa was placed in an Endo Catch bag.  The abdomen was copiously irrigated and drained and all operative sites inspected and hemostasis was assured  The robot was undocked. The camera was placed through the left upper abdominal incision. The contents of the left Endo Catch bag were first aspirated and then morcellated to facilitate removal from the abdominal cavity through the umbilical incision. In a similar fashion the contents of the right Endo Catch bag or morcellated to facilitate removal from the abdominal cavity.  The ports were all remove. The fascial closure at the umbilical incision and left upper quadrant port was made with 0 Vicryl.  All incisions were closed with a running subcuticular Monocryl suture. Dermabond was applied. Sponge, lap and needle counts were correct x 3.    The patient had sequential compression devices for VTE prophylaxis.         Disposition: PACU          Condition: stable  Donaciano Eva, MD

## 2020-03-09 ENCOUNTER — Telehealth: Payer: Self-pay | Admitting: *Deleted

## 2020-03-09 ENCOUNTER — Encounter (HOSPITAL_COMMUNITY): Payer: Self-pay | Admitting: Gynecologic Oncology

## 2020-03-09 ENCOUNTER — Telehealth: Payer: Self-pay

## 2020-03-09 LAB — SURGICAL PATHOLOGY

## 2020-03-09 NOTE — Telephone Encounter (Signed)
Kimberly Mueller states that she is eating, drinking, and urinating well. She is having some pain with urination.Told her to increase her intake of water to 64 oz a day. This will help dilute the urine.  The pain initially is from the foley catheter that is placed for surgery.  Pain should decrease as the days goes on.  If she continues with the urinary pain today and tomorrow, she needs to call the office and she may need to give a urine specimen to see if she has a UTI. She is passing gas. Told her to begin using her extra strength colace bid and add Miralax daily if no BM by Friday am. Afebrile. Incisions are D&I. Pain is controlled with Tylenol and occasional Oxycodone. Pt cannot take NSAID's. Kimberly Mueller is aware of post op appointment on 03-28-20 at 1345 as well as the office number [phone] to call if she has any questions or concerns.

## 2020-03-09 NOTE — Telephone Encounter (Signed)
CALLED PATIENT TO ASK ABOUT COMING IN EARLIER ON 03-17-20 DUE TO DR. KINARD BEING OFF ON 03-17-20 IN THE PM, PATIENT AGREED TO COME ON 03-17-20 @ 9:45 AM

## 2020-03-10 ENCOUNTER — Telehealth: Payer: Self-pay | Admitting: *Deleted

## 2020-03-10 NOTE — Telephone Encounter (Signed)
Called patient to discuss benign pathology report. No answer, unable to leave a message mailbox full.

## 2020-03-11 NOTE — Telephone Encounter (Signed)
Spoke with patient this morning to inform that final path results are benign.  Patient had no questions at this time.  Voiced understanding of above.

## 2020-03-15 ENCOUNTER — Other Ambulatory Visit: Payer: Self-pay

## 2020-03-15 ENCOUNTER — Ambulatory Visit (HOSPITAL_COMMUNITY)
Admission: RE | Admit: 2020-03-15 | Discharge: 2020-03-15 | Disposition: A | Discharge status: Home / Self Care | Payer: No Typology Code available for payment source | Source: Ambulatory Visit | Attending: Adult Health | Admitting: Adult Health

## 2020-03-15 DIAGNOSIS — Z87891 Personal history of nicotine dependence: Secondary | ICD-10-CM | POA: Insufficient documentation

## 2020-03-15 DIAGNOSIS — Z170 Estrogen receptor positive status [ER+]: Secondary | ICD-10-CM | POA: Insufficient documentation

## 2020-03-15 DIAGNOSIS — C50311 Malignant neoplasm of lower-inner quadrant of right female breast: Secondary | ICD-10-CM | POA: Insufficient documentation

## 2020-03-15 DIAGNOSIS — Z0189 Encounter for other specified special examinations: Secondary | ICD-10-CM

## 2020-03-15 LAB — ECHOCARDIOGRAM COMPLETE: S' Lateral: 2.9 cm

## 2020-03-15 NOTE — Progress Notes (Signed)
  Echocardiogram 2D Echocardiogram has been performed.  Jannett Celestine 03/15/2020, 9:43 AM

## 2020-03-16 ENCOUNTER — Ambulatory Visit
Admission: RE | Admit: 2020-03-16 | Discharge: 2020-03-16 | Disposition: A | Discharge status: Home / Self Care | Payer: No Typology Code available for payment source | Source: Ambulatory Visit | Attending: Adult Health | Admitting: Adult Health

## 2020-03-16 ENCOUNTER — Ambulatory Visit: Payer: 59

## 2020-03-16 DIAGNOSIS — E2839 Other primary ovarian failure: Secondary | ICD-10-CM

## 2020-03-17 ENCOUNTER — Ambulatory Visit
Admission: RE | Admit: 2020-03-17 | Discharge: 2020-03-17 | Disposition: A | Discharge status: Home / Self Care | Payer: No Typology Code available for payment source | Source: Ambulatory Visit | Attending: Radiation Oncology | Admitting: Radiation Oncology

## 2020-03-17 ENCOUNTER — Telehealth: Payer: Self-pay

## 2020-03-17 NOTE — Telephone Encounter (Signed)
Telephone call to patient and ML on VM to call and r/s if her appointment if she is not coming today. Contact # provided.

## 2020-03-21 ENCOUNTER — Other Ambulatory Visit (HOSPITAL_COMMUNITY): Payer: Self-pay | Admitting: *Deleted

## 2020-03-21 DIAGNOSIS — C50311 Malignant neoplasm of lower-inner quadrant of right female breast: Secondary | ICD-10-CM

## 2020-03-23 ENCOUNTER — Other Ambulatory Visit: Payer: Self-pay

## 2020-03-23 ENCOUNTER — Other Ambulatory Visit: Payer: Self-pay | Admitting: Hematology and Oncology

## 2020-03-23 ENCOUNTER — Inpatient Hospital Stay: Payer: No Typology Code available for payment source

## 2020-03-23 ENCOUNTER — Inpatient Hospital Stay: Payer: No Typology Code available for payment source | Attending: Hematology and Oncology

## 2020-03-23 VITALS — BP 112/86 | HR 76 | Temp 98.7°F | Resp 18

## 2020-03-23 DIAGNOSIS — Z5112 Encounter for antineoplastic immunotherapy: Secondary | ICD-10-CM | POA: Insufficient documentation

## 2020-03-23 DIAGNOSIS — Z170 Estrogen receptor positive status [ER+]: Secondary | ICD-10-CM | POA: Diagnosis not present

## 2020-03-23 DIAGNOSIS — C50211 Malignant neoplasm of upper-inner quadrant of right female breast: Secondary | ICD-10-CM | POA: Insufficient documentation

## 2020-03-23 DIAGNOSIS — C50311 Malignant neoplasm of lower-inner quadrant of right female breast: Secondary | ICD-10-CM

## 2020-03-23 MED ORDER — ACETAMINOPHEN 325 MG PO TABS
ORAL_TABLET | ORAL | Status: AC
  Filled 2020-03-23: qty 2

## 2020-03-23 MED ORDER — DIPHENHYDRAMINE HCL 25 MG PO CAPS
ORAL_CAPSULE | ORAL | Status: AC
  Filled 2020-03-23: qty 2

## 2020-03-23 MED ORDER — ACETAMINOPHEN 325 MG PO TABS
650.0000 mg | ORAL_TABLET | Freq: Once | ORAL | Status: AC
  Administered 2020-03-23: 650 mg via ORAL

## 2020-03-23 MED ORDER — DIPHENHYDRAMINE HCL 25 MG PO CAPS
50.0000 mg | ORAL_CAPSULE | Freq: Once | ORAL | Status: AC
  Administered 2020-03-23: 50 mg via ORAL

## 2020-03-23 MED ORDER — TRASTUZUMAB-HYALURONIDASE-OYSK 600-10000 MG-UNT/5ML ~~LOC~~ SOLN
600.0000 mg | Freq: Once | SUBCUTANEOUS | Status: AC
  Administered 2020-03-23: 600 mg via SUBCUTANEOUS
  Filled 2020-03-23: qty 5

## 2020-03-23 NOTE — Progress Notes (Signed)
Nutrition Follow-up:  Patient with right breast cancer, receiving herceptin q 3 weeks.  S/p bilateral salpingo ooophorectomy on 8/31. [place]  Met with patient during infusion.  Patient reports surgery went well.  Appetite has been off a little bit post surgery.  Reports that house has been on the market and just received an offer on it today.  Husband is going into the [employer].  Patient is going to be living with sister while husband is away.  Patient reports that she has been drinking 2 premier protein shakes per day.  Has been snacking on nuts.  Had eggs, bacon, toast for supper last night.      Medications: reviewed  Labs: reviewed  Anthropometrics:   Weight 117 lb on 8/31 slight decrease of 119 lb on 8/23  117 lb on 7/22 123 lb on 6/23 136 lb on 3/21 146 lb on 2/17 158 lb on 1/13 166 lb on 12/20   NUTRITION DIAGNOSIS: Unintentional weight loss conitnues    INTERVENTION:  Continue encouragement of high calorie, high protein foods to prevent further weight loss. Continue premier protein shakes for additional calories and protein.     MONITORING, EVALUATION, GOAL: weight trends, intake   NEXT VISIT: Thursday, October 7 during infusion  Aubrionna Istre B. Zenia Resides, [place], [place] Registered Dietitian [phone] (mobile)

## 2020-03-23 NOTE — Patient Instructions (Signed)
[facility] Discharge Instructions for Patients Receiving Chemotherapy  Today you received the following chemotherapy agents Trastuzumab-hyaluronidase-oysk (HERCEPTIN HYLECTA).  To help prevent nausea and vomiting after your treatment, we encourage you to take your nausea medication as prescribed.  If you develop nausea and vomiting that is not controlled by your nausea medication, call the clinic.   BELOW ARE SYMPTOMS THAT SHOULD BE REPORTED IMMEDIATELY:  *FEVER GREATER THAN 100.5 F  *CHILLS WITH OR WITHOUT FEVER  NAUSEA AND VOMITING THAT IS NOT CONTROLLED WITH YOUR NAUSEA MEDICATION  *UNUSUAL SHORTNESS OF BREATH  *UNUSUAL BRUISING OR BLEEDING  TENDERNESS IN MOUTH AND THROAT WITH OR WITHOUT PRESENCE OF ULCERS  *URINARY PROBLEMS  *BOWEL PROBLEMS  UNUSUAL RASH Items with * indicate a potential emergency and should be followed up as soon as possible.  Feel free to call the clinic should you have any questions or concerns. The clinic phone number is [phone].  Please show the CHEMO ALERT CARD at check-in to the Emergency Department and triage nurse.   

## 2020-03-25 IMAGING — CT CT ABD-PELV W/ CM
3 of 4 series · 11 of 46 positions shown, 16 images · IV contrast (omnipaque)
Comparison: CT 06/05/2019, MRI 06/15/2019

CLINICAL DATA: Generalized abdominal pain, history of breast cancer

EXAM:
CT ABDOMEN AND PELVIS WITH CONTRAST
TECHNIQUE: Multidetector CT imaging of the abdomen and pelvis was performed
using the standard protocol following bolus administration of
intravenous contrast.
CONTRAST:  100mL OMNIPAQUE IOHEXOL 300 MG/ML  SOLN

[Series 2: axial st · axial · 0.88mm/px · z∈[-908,-572]mm · 7 of 91 slices shown, 12 images]
[im 12/91  soft-tissue]
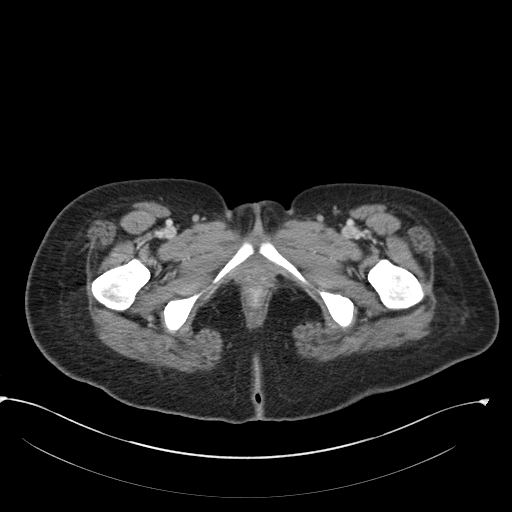
[im 12/91  bone]
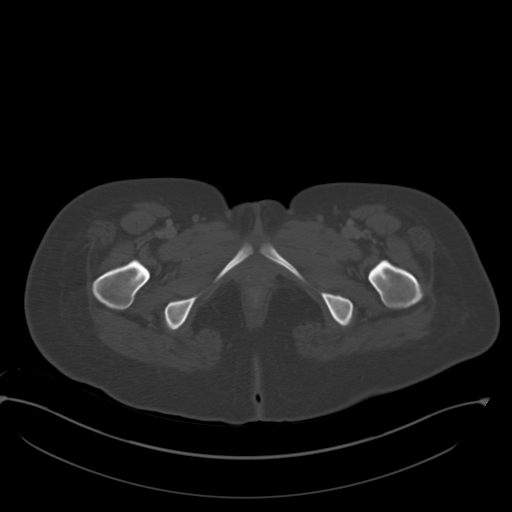
[im 23/91  soft-tissue]
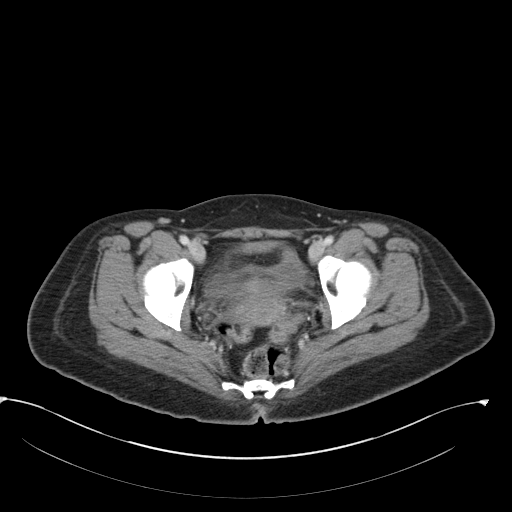
[im 34/91  soft-tissue]
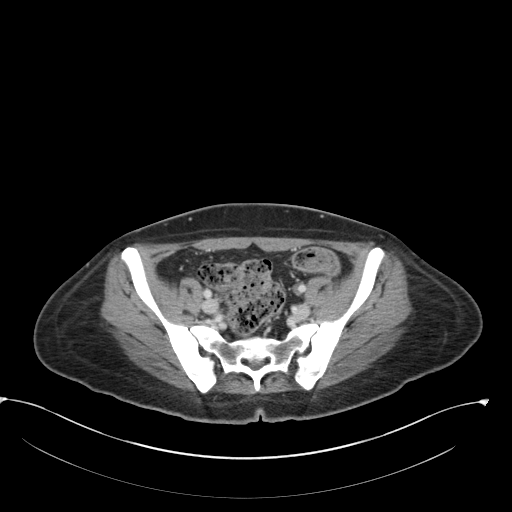
[im 46/91  soft-tissue]
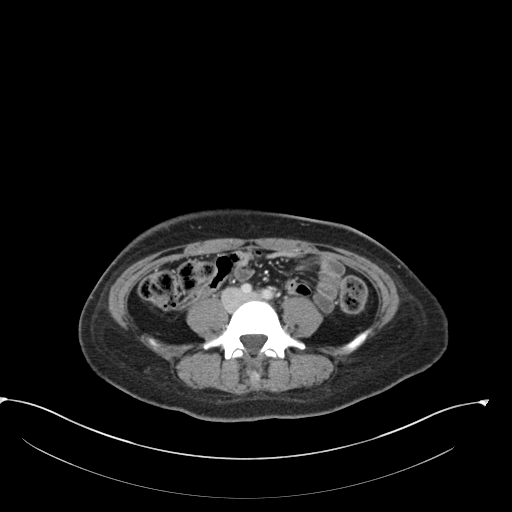
[im 46/91  lung]
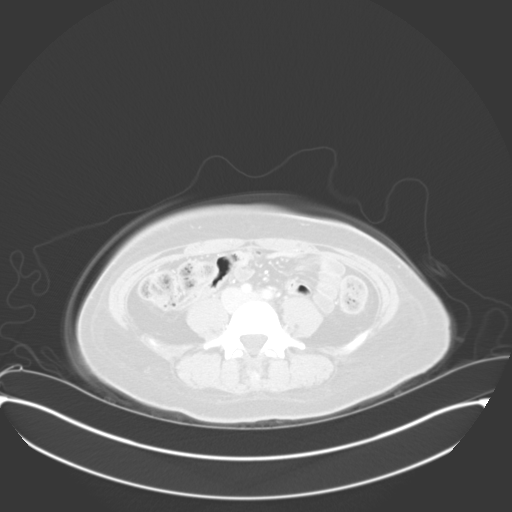
[im 57/91  soft-tissue]
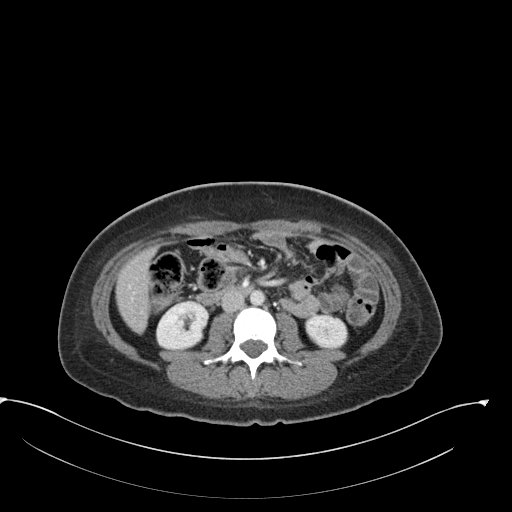
[im 57/91  lung]
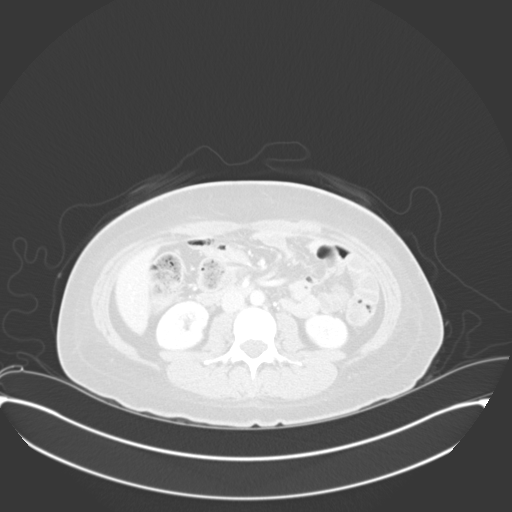
[im 68/91  soft-tissue]
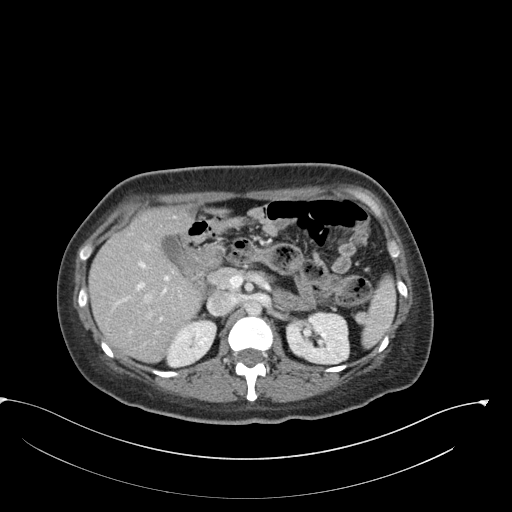
[im 68/91  lung]
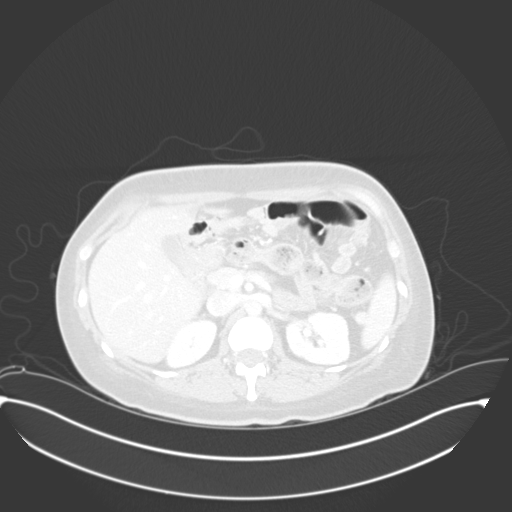
[im 79/91  soft-tissue]
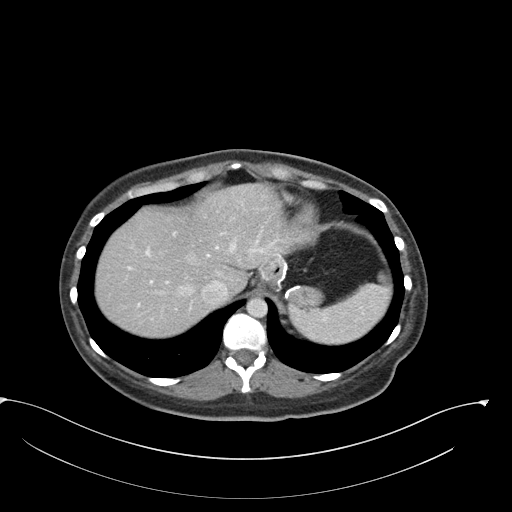
[im 79/91  lung]
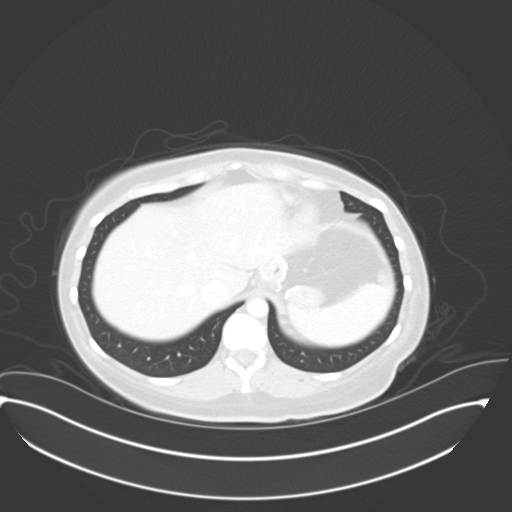

[Series 4: coronal st · coronal · 0.79mm/px · 3 of 118 slices shown]
[im 40/118  soft-tissue]
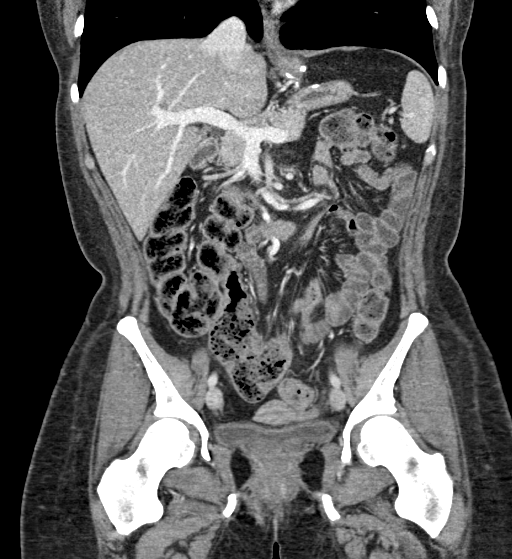
[im 53/118  soft-tissue]
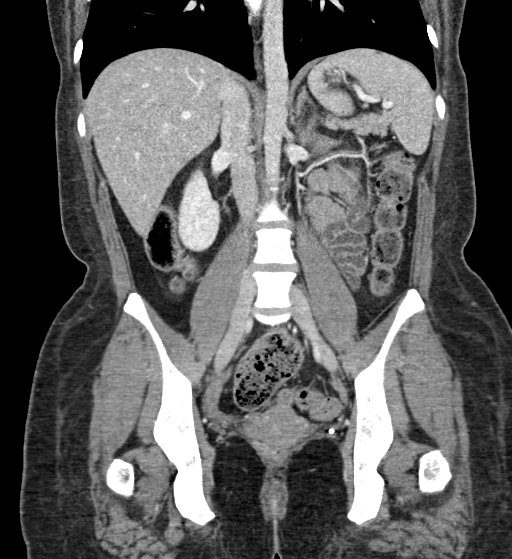
[im 66/118  soft-tissue]
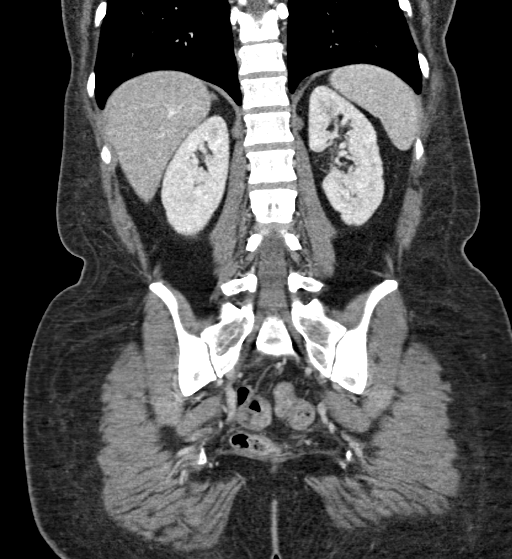

[Series 5: sagittal st · sagittal · 0.39mm/px · 1 of 158 slices shown]
[im 53/158  soft-tissue]
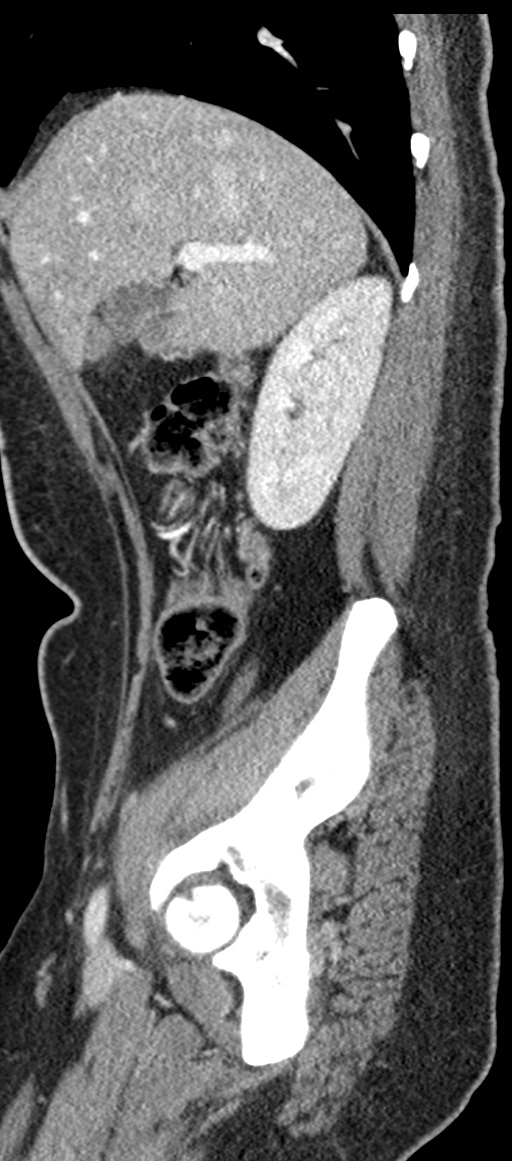

[11 of 46 positions shown; findings below may reference images not displayed]

FINDINGS: Lower chest: Lung bases demonstrate no acute consolidation or
pleural effusion. Interval decrease in size of previously noted
right lower breast mass.

Hepatobiliary: Left lobe liver mass more difficult to visualize on
today's study as it appears more isodense to liver parenchyma.
Previous biopsy demonstrated adenoma. This is grossly unchanged in
size. Additional subcentimeter hypodensity in the left hepatic lobe
measuring 6 mm, previously 6 mm. No calcified gallstone or biliary
dilatation

Pancreas: Unremarkable. No pancreatic ductal dilatation or
surrounding inflammatory changes.

Spleen: Normal in size without focal abnormality.

Adrenals/Urinary Tract: Adrenal glands are unremarkable. Kidneys are
normal, without renal calculi, focal lesion, or hydronephrosis.
Bladder is unremarkable.

Stomach/Bowel: Status post gastric bypass. Negative for bowel
dilatation or bowel wall thickening. Moderate fecal debris in the
colon and rectum.

Vascular/Lymphatic: No significant vascular findings are present. No
enlarged abdominal or pelvic lymph nodes.

Reproductive: Uterus and bilateral adnexa are unremarkable.

Other: No abdominal wall hernia or abnormality. No abdominopelvic
ascites.

Musculoskeletal: No acute or significant osseous findings.
IMPRESSION: 1. No CT evidence for acute intra-abdominal or pelvic abnormality.
Moderate fecal debris in the colon and rectum
2. Previously noted left lobe liver mass more difficult to visualize
on today's study. No new focal hepatic abnormality demonstrated.
3. Status post gastric bypass. Negative for bowel obstruction or
bowel inflammatory process.
4. Interval decrease in size of previously noted right lower breast
mass.

## 2020-03-28 ENCOUNTER — Encounter: Payer: Self-pay | Admitting: Gynecologic Oncology

## 2020-03-28 ENCOUNTER — Other Ambulatory Visit: Payer: Self-pay

## 2020-03-28 ENCOUNTER — Inpatient Hospital Stay: Payer: No Typology Code available for payment source | Attending: Radiation Oncology | Admitting: Gynecologic Oncology

## 2020-03-28 VITALS — BP 108/76 | HR 88 | Temp 97.8°F | Resp 16 | Ht 63.0 in | Wt 112.6 lb

## 2020-03-28 DIAGNOSIS — C50311 Malignant neoplasm of lower-inner quadrant of right female breast: Secondary | ICD-10-CM

## 2020-03-28 DIAGNOSIS — K219 Gastro-esophageal reflux disease without esophagitis: Secondary | ICD-10-CM | POA: Insufficient documentation

## 2020-03-28 DIAGNOSIS — Z800 Family history of malignant neoplasm of digestive organs: Secondary | ICD-10-CM | POA: Insufficient documentation

## 2020-03-28 DIAGNOSIS — Z87891 Personal history of nicotine dependence: Secondary | ICD-10-CM | POA: Insufficient documentation

## 2020-03-28 DIAGNOSIS — Z90722 Acquired absence of ovaries, bilateral: Secondary | ICD-10-CM | POA: Insufficient documentation

## 2020-03-28 DIAGNOSIS — Z79899 Other long term (current) drug therapy: Secondary | ICD-10-CM | POA: Insufficient documentation

## 2020-03-28 DIAGNOSIS — F419 Anxiety disorder, unspecified: Secondary | ICD-10-CM | POA: Insufficient documentation

## 2020-03-28 DIAGNOSIS — F329 Major depressive disorder, single episode, unspecified: Secondary | ICD-10-CM | POA: Insufficient documentation

## 2020-03-28 DIAGNOSIS — Z79811 Long term (current) use of aromatase inhibitors: Secondary | ICD-10-CM | POA: Insufficient documentation

## 2020-03-28 DIAGNOSIS — Z170 Estrogen receptor positive status [ER+]: Secondary | ICD-10-CM

## 2020-03-28 DIAGNOSIS — E669 Obesity, unspecified: Secondary | ICD-10-CM | POA: Insufficient documentation

## 2020-03-28 DIAGNOSIS — Z8042 Family history of malignant neoplasm of prostate: Secondary | ICD-10-CM | POA: Insufficient documentation

## 2020-03-28 DIAGNOSIS — Z923 Personal history of irradiation: Secondary | ICD-10-CM | POA: Insufficient documentation

## 2020-03-28 DIAGNOSIS — Z9884 Bariatric surgery status: Secondary | ICD-10-CM | POA: Insufficient documentation

## 2020-03-28 DIAGNOSIS — Z9221 Personal history of antineoplastic chemotherapy: Secondary | ICD-10-CM | POA: Insufficient documentation

## 2020-03-28 NOTE — Patient Instructions (Signed)
Dr Denman George has cleared you to return on October 11th. You have no restrictions to activity at home.  If your severe fatigue remains as your return to work becomes closer, consider discussing this with Wilber Bihari or Dr Lindi Adie as this likely reflects the [place].

## 2020-03-28 NOTE — Progress Notes (Signed)
Follow-up Note: Gyn-Onc  Consult was requested by Dr. Lindi Mueller for the evaluation of Kimberly Mueller 34 y.o. female  CC:  Chief Complaint  Patient presents with   Breast Cancer    post op    Assessment/Plan:  [name]  is a 34 y.o.  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 S/p BSO on 03/08/20.   She has profound fatigue that may be secondary to her anastrazole. I typically do not see this much fatigue after uncomplicated BSO procedures. I have cleared her for an additional 2 weeks off of work.   HPI: [name] is a very pleasant 34 year old G0 who was seen in consultation at the request of Dr Kimberly Mueller for evaluation of hormone receptor positive breast cancer desiring chemical versus surgical castration.  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  Treatment plan was initiated with neoadjuvant chemotherapy with Adriamycin Cytoxan and Taxol.  She is currently undergoing the Taxol portion of this.  Plan is for surgical excision followed by radiation after chemotherapy.  She underwent genetic testing on June 15, 2019 which was negative for BRCA mutations.  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  The patient is otherwise very healthy.  She has a history of obesity which was treated with a laparoscopic gastric bypass in [place] in 2020.  She lost 90 pounds after this.  She is of normal body weight with a BMI of 23.  The patient has had no other prior abdominal surgeries besides this gastric bypass.  She has never been pregnant.  She is somewhat ambivalent about children but decided after her breast cancer diagnosis that she would unlikely ever try for pregnancy given the hormone receptor positive nature of her breast cancer.  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  She has been vaccinated against HPV.  The patient completed chemotherapy and surgery for her breast cancer. She has completed radiation to begin in June/July.  She desired definitive surgical castration. She is currently using Herceptin and will be on this for extended therapy. She was receiving goserelin injections and has had her implanon removed.   Interval Hx:  On 03/08/20 she underwent robotic BSO. Intraoperative findings were significant for no grossly abnormal intraperitoneal findings, normal small uterus and ovaries. Surgery was uncomplictted.  Final pathology revealed benign ovaries and tubes.  Since surgery she has mainly been afflicted by hot flashes which is stable from preoperatively and most notably increased fatigue.  Is unclear if this is secondary to surgery on the anastrozole.  She denies abdominal pain..   Current Meds:  Outpatient Encounter Medications as of 03/28/2020  Medication Sig   acetaminophen (TYLENOL) 500 MG tablet Take 1,000 mg by mouth every 6 (six) hours as needed for mild pain or moderate pain.   anastrozole (ARIMIDEX) 1 MG tablet Take 1 tablet (1 mg total) by mouth daily.   Calcium Carbonate-Vit D-Min (CALTRATE PLUS PO) Take 2 tablets by mouth daily.   diphenhydramine-acetaminophen (TYLENOL PM) 25-500 MG TABS tablet Take 2 tablets by mouth at bedtime as needed (sleep).   DOCUSATE SODIUM PO Take 1 capsule by mouth See admin instructions. Regular strength during the day and maximin strength at bedtime   gabapentin (NEURONTIN) 100 MG capsule Take 1 capsule (  100 mg total) by mouth at bedtime.   Multiple Vitamins-Minerals (MULTIVITAMIN WITH MINERALS) tablet Take 1 tablet by mouth daily. [place]   NON FORMULARY Take 1 tablet by mouth at bedtime. [place] Signature sleep aide    omeprazole (PRILOSEC) 40 MG capsule Take 1 capsule (40 mg total) by mouth daily.   simethicone (MYLICON) 80 MG chewable tablet  Chew 80 mg by mouth every 6 (six) hours as needed for flatulence.   venlafaxine XR (EFFEXOR-XR) 37.5 MG 24 hr capsule TAKE 1 CAPSULE BY MOUTH DAILY WITH BREAKFAST. (Patient taking differently: Take 37.5 mg by mouth daily with breakfast. )   zolpidem (AMBIEN) 10 MG tablet Take 0.5 tablets (5 mg total) by mouth at bedtime as needed for sleep. (Patient taking differently: Take 5 mg by mouth at bedtime. )   [DISCONTINUED] ascorbic acid (VITAMIN C) 500 MG tablet Take 500 mg by mouth daily. [place]   [DISCONTINUED] goserelin (ZOLADEX) 3.6 MG injection Inject 3.6 mg into the skin every 28 (twenty-eight) days.   [DISCONTINUED] oxyCODONE (OXY IR/ROXICODONE) 5 MG immediate release tablet Take 1 tablet (5 mg total) by mouth every 4 (four) hours as needed for severe pain. For AFTER surgery, do not take and drive   [DISCONTINUED] prochlorperazine (COMPAZINE) 10 MG tablet Take 1 tablet (10 mg total) by mouth every 6 (six) hours as needed (Nausea or vomiting).   No facility-administered encounter medications on file as of 03/28/2020.    Allergy:  Allergies  Allergen Reactions   Adhesive [Tape]     Sensitivity to tape (not sure what kind)    Social Hx:   Social History   Socioeconomic History   Marital status: Married    Spouse name: Not on file   Number of children: Not on file   Years of education: Not on file   Highest education level: Not on file  Occupational History   Not on file  Tobacco Use   Smoking status: Former Smoker   Smokeless tobacco: Never Used  Scientific laboratory technician Use: Every day  Substance and Sexual Activity   Alcohol use: Yes    Comment: occas.   Drug use: Never   Sexual activity: Not on file  Other Topics Concern   Not on file  Social History Narrative   Not on file   Social Determinants of Health   Financial Resource Strain:    Difficulty of Paying Living Expenses: Not on file  Food Insecurity:    Worried About [place] in the  Last Year: Not on file   Ran Out of Food in the Last Year: Not on file  Transportation Needs:    Lack of Transportation (Medical): Not on file   Lack of Transportation (Non-Medical): Not on file  Physical Activity:    Days of Exercise per Week: Not on file   Minutes of Exercise per Session: Not on file  Stress:    Feeling of Stress : Not on file  Social Connections:    Frequency of Communication with Friends and Family: Not on file   Frequency of Social Gatherings with Friends and Family: Not on file   Attends Religious Services: Not on file   Active Member of Clubs or Organizations: Not on file   Attends Archivist Meetings: Not on file   Marital Status: Not on file  Intimate Partner Violence:    Fear of Current or Ex-Partner: Not on file   Emotionally Abused: Not on file   Physically Abused:  Not on file   Sexually Abused: Not on file    Past Surgical Hx:  Past Surgical History:  Procedure Laterality Date   BREAST LUMPECTOMY WITH RADIOACTIVE SEED AND SENTINEL LYMPH NODE BIOPSY Right 11/18/2019   Procedure: RIGHT BREAST SEED GUIDED LUMPECTOMY, RIGHT AXILLARY SENTINEL LYMPH NODE BIOSPY;  Surgeon: Rolm Bookbinder, MD;  Location: [facility];  Service: General;  Laterality: Right;  GENERAL AND PECTORAL BLOCK   FOREIGN BODY REMOVAL N/A 11/18/2019   Procedure: NEXPLANON REMOVAL;  Surgeon: Rolm Bookbinder, MD;  Location: [facility];  Service: General;  Laterality: N/A;  GENERAL AND PECTORAL BLOCK   GASTRIC BYPASS     HERNIA REPAIR     PORT-A-CATH REMOVAL N/A 11/18/2019   Procedure: REMOVAL PORT-A-CATH;  Surgeon: Rolm Bookbinder, MD;  Location: [facility];  Service: General;  Laterality: N/A;  GENERAL AND PECTORAL BLOCK   PORTACATH PLACEMENT Right 06/09/2019   Procedure: INSERTION PORT-A-CATH WITH ULTRASOUND;  Surgeon: Rolm Bookbinder, MD;  Location: [facility];  Service: General;   Laterality: Right;   ROBOTIC ASSISTED BILATERAL SALPINGO OOPHERECTOMY Bilateral 03/08/2020   Procedure: XI ROBOTIC ASSISTED BILATERAL SALPINGO OOPHORECTOMY;  Surgeon: Everitt Amber, MD;  Location: [facility];  Service: Gynecology;  Laterality: Bilateral;    Past Medical Hx:  Past Medical History:  Diagnosis Date   Anxiety    Breast cancer ([place])    Cancer ([place])    breast   Depression    Family history of pancreatic cancer    Family history of prostate cancer    GERD (gastroesophageal reflux disease)     Past Gynecological History:   Patient's last menstrual period was 06/09/2019.  Family Hx:  Family History  Problem Relation Age of Onset   Prostate cancer Father    Pancreatic cancer Paternal Grandmother     Review of Systems:  Constitutional  Feels fatigued!   ENT Normal appearing ears and nares bilaterally Skin/Breast  No rash, sores, jaundice, itching, dryness Cardiovascular  No chest pain, shortness of breath, or edema  Pulmonary  No cough or wheeze.  Gastro Intestinal  No nausea, vomitting, or diarrhoea. No bright red blood per rectum, no abdominal pain, change in bowel movement,  Genito Urinary  No frequency, urgency, dysuria, no bleeding Musculo Skeletal  No myalgia, arthralgia, joint swelling or pain  Neurologic  No weakness, numbness, change in gait,  Psychology  No depression, anxiety, insomnia.   Vitals:  Blood pressure 108/76, pulse 88, temperature 97.8 F (36.6 C), temperature source Tympanic, resp. rate 16, height '5\' 3"'  (1.6 m), weight 112 lb 9.6 oz (51.1 kg), last menstrual period 06/09/2019, SpO2 99 %.  Physical Exam: WD in NAD Neck  Supple NROM, without any enlargements.  Skin  No rash/lesions/breakdown  Psychiatry  Alert and oriented to person, place, and time  Abdomen  Normoactive bowel sounds, abdomen soft, non-tender and nonobese without evidence of hernia. Well healed incisions.  Back No CVA tenderness Genito Urinary   deferred Rectal  deferred Extremities  No bilateral cyanosis, clubbing or edema.   Thereasa Solo, MD  03/28/2020, 2:08 PM

## 2020-04-04 ENCOUNTER — Ambulatory Visit: Payer: No Typology Code available for payment source

## 2020-04-12 ENCOUNTER — Other Ambulatory Visit: Payer: Self-pay | Admitting: *Deleted

## 2020-04-12 ENCOUNTER — Telehealth: Payer: Self-pay | Admitting: *Deleted

## 2020-04-12 DIAGNOSIS — R82998 Other abnormal findings in urine: Secondary | ICD-10-CM

## 2020-04-12 NOTE — Telephone Encounter (Signed)
Pt sent msg stating urine has been the color of "tea" and having what feels like "kidney spasms". Pt has increased hydration. No change to color of urine. Pt scheduled to see Dr. Lindi Adie on 10/7. Order placed for UA per Dr. Lindi Adie. Pt notified.

## 2020-04-13 ENCOUNTER — Encounter: Payer: 59 | Admitting: Adult Health

## 2020-04-13 ENCOUNTER — Ambulatory Visit: Payer: 59

## 2020-04-13 NOTE — Progress Notes (Signed)
Patient Care Team: Jilda Panda, MD as PCP - General (Internal Medicine) Nicholas Lose, MD as Consulting Physician (Hematology and Oncology) Rolm Bookbinder, MD as Consulting Physician (General Surgery) Gery Pray, MD as Consulting Physician (Radiation Oncology) Rockwell Germany, RN as Oncology Nurse Navigator Mauro Kaufmann, RN as Oncology Nurse Navigator  DIAGNOSIS:    ICD-10-CM   1. Malignant neoplasm of lower-inner quadrant of right breast of female, estrogen receptor positive ([place])  C50.311    Z17.0     SUMMARY OF ONCOLOGIC HISTORY: Oncology History  Malignant neoplasm of lower-inner quadrant of right breast of female, estrogen receptor positive ([place])  05/25/2019 Initial Diagnosis   Patient palpated a right breast lump for two years. Diagnostic mammogram showed a 4.5cm right breast mass at the 6 o'clock position involving the overlying skin, 2 benign appearing left breast mass at the 10 o'clock position measuring 2.4cm and 2.1cm, and no axillary adenopathy. Biopsy showed: in the left breast, fibroadenoma and no evidence of malignancy, and in the right breast, IDC with DCIS, grade 3.  ER 70%, PR 30%, Ki-67 40%, HER-2 negative by [facility]    06/10/2019 - 10/28/2019 Chemotherapy   The patient had dexamethasone (DECADRON) 4 MG tablet, 4 mg (100 % of original dose 4 mg), Oral, Daily, 1 of 1 cycle, Start date: 06/05/2019, End date: 08/12/2019 Dose modification: 4 mg (original dose 4 mg, Cycle 0) DOXOrubicin (ADRIAMYCIN) chemo injection 118 mg, 60 mg/m2 = 118 mg, Intravenous,  Once, 4 of 4 cycles Dose modification: 50 mg/m2 (original dose 60 mg/m2, Cycle 2, Reason: Dose not tolerated) Administration: 118 mg (06/10/2019), 98 mg (06/24/2019), 98 mg (07/08/2019), 98 mg (07/22/2019) palonosetron (ALOXI) injection 0.25 mg, 0.25 mg, Intravenous,  Once, 4 of 4 cycles Administration: 0.25 mg (06/10/2019), 0.25 mg (06/24/2019), 0.25 mg (07/08/2019), 0.25 mg (07/22/2019) pegfilgrastim-cbqv (UDENYCA)  injection 6 mg, 6 mg, Subcutaneous, Once, 4 of 4 cycles Administration: 6 mg (06/12/2019), 6 mg (06/26/2019), 6 mg (07/10/2019), 6 mg (07/24/2019) cyclophosphamide (CYTOXAN) 1,180 mg in sodium chloride 0.9 % 250 mL chemo infusion, 600 mg/m2 = 1,180 mg, Intravenous,  Once, 4 of 4 cycles Dose modification: 500 mg/m2 (original dose 600 mg/m2, Cycle 2, Reason: Dose not tolerated) Administration: 1,180 mg (06/10/2019), 980 mg (06/24/2019), 980 mg (07/08/2019), 980 mg (07/22/2019) PACLitaxel (TAXOL) 156 mg in sodium chloride 0.9 % 250 mL chemo infusion (</= 61m/m2), 80 mg/m2 = 156 mg, Intravenous,  Once, 12 of 12 cycles Dose modification: 65 mg/m2 (original dose 80 mg/m2, Cycle 11, Reason: Provider Judgment), 50 mg/m2 (original dose 80 mg/m2, Cycle 12, Reason: Dose not tolerated) Administration: 156 mg (08/12/2019), 156 mg (08/19/2019), 138 mg (08/26/2019), 138 mg (09/02/2019), 138 mg (09/09/2019), 138 mg (09/16/2019), 114 mg (09/23/2019), 90 mg (09/30/2019), 90 mg (10/07/2019), 90 mg (10/14/2019), 90 mg (10/21/2019), 90 mg (10/28/2019) fosaprepitant (EMEND) 150 mg, dexamethasone (DECADRON) 12 mg in sodium chloride 0.9 % 145 mL IVPB, , Intravenous,  Once, 4 of 4 cycles Administration:  (06/10/2019),  (06/24/2019),  (07/08/2019),  (07/22/2019)  for chemotherapy treatment.     Genetic Testing   No pathogenic variants identified. VUS in DTangipahoacalled c.2378A>G identified on the [facility] Common Hereditary Cancers Panel. The report date is 06/15/2019.  The Common Hereditary Cancers Panel offered by [facility] includes sequencing and/or deletion duplication testing of the following 48 genes: APC, ATM, AXIN2, BARD1, BMPR1A, BRCA1, BRCA2, BRIP1, CDH1, CDKN2A (p14ARF), CDKN2A (p16INK4a), CKD4, CHEK2, CTNNA1, DICER1, EPCAM (Deletion/duplication testing only), GREM1 (promoter region deletion/duplication testing only), KIT, MEN1, MLH1, MSH2, MSH3, MSH6,  MUTYH, NBN, NF1, NHTL1, PALB2, PDGFRA, PMS2, POLD1, POLE, PTEN, RAD50, RAD51C, RAD51D, RNF43,  SDHB, SDHC, SDHD, SMAD4, SMARCA4. STK11, TP53, TSC1, TSC2, and VHL.  The following genes were evaluated for sequence changes only: SDHA and HOXB13 c.251G>A variant only.   11/18/2019 Surgery   Right lumpectomy Donne Hazel): IDC, grade 3, 1.5cm, with grade 2-3 DCIS, 2 right axillary lymph nodes negative. FISH on pathology revealed HER-2 positivity.   12/09/2019 -  Chemotherapy   The patient had trastuzumab-hyaluronidase-oysk (HERCEPTIN HYLECTA) 600-10000 MG-UNT/5ML chemo SQ injection 600 mg, 600 mg, Subcutaneous,  Once, 6 of 17 cycles Administration: 600 mg (12/09/2019), 600 mg (12/30/2019), 600 mg (01/20/2020), 600 mg (02/10/2020), 600 mg (03/02/2020), 600 mg (03/23/2020)  for chemotherapy treatment.    12/28/2019 - 02/12/2020 Radiation Therapy   Adjuvant radiation   02/2020 -  Anti-estrogen oral therapy   Zoladex monthly until she underwent a BSO in 02/2020; Anastrozole started 02/2020     CHIEF COMPLIANT: Follow-up of right breast cancer on anastrozole, Herceptin maintenance  INTERVAL HISTORY: Kimberly Mueller is a 34 y.o. with above-mentioned history of right breast cancer who completed neoadjuvant chemotherapy, lumpectomy, radiation, and is currently on Herceptin maintenance and antiestrogen therapy with anastrozole. She presents to the clinic today for follow-up.   ALLERGIES:  is allergic to adhesive [tape].  MEDICATIONS:  Current Outpatient Medications  Medication Sig Dispense Refill  . acetaminophen (TYLENOL) 500 MG tablet Take 1,000 mg by mouth every 6 (six) hours as needed for mild pain or moderate pain.    Marland Kitchen anastrozole (ARIMIDEX) 1 MG tablet Take 1 tablet (1 mg total) by mouth daily. 30 tablet 6  . Calcium Carbonate-Vit D-Min (CALTRATE PLUS PO) Take 2 tablets by mouth daily.    . diphenhydramine-acetaminophen (TYLENOL PM) 25-500 MG TABS tablet Take 2 tablets by mouth at bedtime as needed (sleep).    . DOCUSATE SODIUM PO Take 1 capsule by mouth See admin instructions. Regular strength during  the day and maximin strength at bedtime    . gabapentin (NEURONTIN) 100 MG capsule Take 1 capsule (100 mg total) by mouth at bedtime. 30 capsule 3  . Multiple Vitamins-Minerals (MULTIVITAMIN WITH MINERALS) tablet Take 1 tablet by mouth daily. [place]    . NON FORMULARY Take 1 tablet by mouth at bedtime. [place] Therapist, art     . omeprazole (PRILOSEC) 40 MG capsule Take 1 capsule (40 mg total) by mouth daily. 90 capsule 3  . simethicone (MYLICON) 80 MG chewable tablet Chew 80 mg by mouth every 6 (six) hours as needed for flatulence.    . venlafaxine XR (EFFEXOR-XR) 37.5 MG 24 hr capsule TAKE 1 CAPSULE BY MOUTH DAILY WITH BREAKFAST. (Patient taking differently: Take 37.5 mg by mouth daily with breakfast. ) 90 capsule 2  . zolpidem (AMBIEN) 10 MG tablet Take 0.5 tablets (5 mg total) by mouth at bedtime as needed for sleep. (Patient taking differently: Take 5 mg by mouth at bedtime. ) 30 tablet 2   No current facility-administered medications for this visit.    PHYSICAL EXAMINATION: ECOG PERFORMANCE STATUS: 1 - Symptomatic but completely ambulatory  Vitals:   04/14/20 0859  BP: (!) 117/93  Pulse: 100  Resp: 18  Temp: (!) 97.1 F (36.2 C)  SpO2: 100%   Filed Weights   04/14/20 0859  Weight: 110 lb (49.9 kg)    LABORATORY DATA:  I have reviewed the data as listed CMP Latest Ref Rng & Units 03/02/2020 02/29/2020 01/25/2020  Glucose 70 - 99 mg/dL 90 94  110(H)  BUN 6 - 20 mg/dL $Remove'9 19 15  'yLjOjhM$ Creatinine 0.44 - 1.00 mg/dL 0.70 0.57 0.78  Sodium 135 - 145 mmol/L 140 138 141  Potassium 3.5 - 5.1 mmol/L 3.9 5.3(H) 4.1  Chloride 98 - 111 mmol/L 104 105 105  CO2 22 - 32 mmol/L $RemoveB'28 28 27  'wnqFnmiz$ Calcium 8.9 - 10.3 mg/dL 10.3 9.9 10.0  Total Protein 6.5 - 8.1 g/dL 7.0 - 7.3  Total Bilirubin 0.3 - 1.2 mg/dL 0.6 - 0.5  Alkaline Phos 38 - 126 U/L 84 - 81  AST 15 - 41 U/L 23 - 27  ALT 0 - 44 U/L 23 - 27    Lab Results  Component Value Date   WBC 2.1 (L) 04/14/2020   HGB 12.0 04/14/2020   HCT  35.4 (L) 04/14/2020   MCV 88.5 04/14/2020   PLT 198 04/14/2020   NEUTROABS 1.2 (L) 04/14/2020    ASSESSMENT & PLAN:  Malignant neoplasm of lower-inner quadrant of right breast of female, estrogen receptor positive ([place]) 05/15/2019:Patient palpated a right breast lump for two years. Diagnostic mammogram showed a 4.5cm right breast mass at the 6 o'clock position involving the overlying skin, 2 benign appearing left breast mass at the 10 o'clock position measuring 2.4cm and 2.1cm, and no axillary adenopathy. 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 Maintenance Herceptin subcutaneously every three weeks x 1 year starting 12/09/2019 3.Adjuvant radiation beginning 12/31/2019 4.Follow-up adjuvant antiestrogen therapyBSO scheduled 7/31, currently on Zoladex, to start Anastrozole mid 02/2020 ----------------------------------------------------------------------------------------------------------------------------------------------- 03/08/2020: Bilateral salpingo-oophorectomy  Herceptin: Will be completed May 2022 Echo on 12/16/2019 EF: 55-60% Weight loss: Prior gastric bypass surgery Current treatment: Anastrozole and Herceptin  Anastrozole toxicities: 1.  Hot flashes: Improved with Effexor 2. fatigue: Slowly improving with time. She has sold her house and moving to her sister's house and she is having to do lots of packing and unpacking.  In  fact this is helped her fatigue because she is now forced to do more activity.  She is planning to go back to work soon.  Insomnia issues: She is getting tolerant to Ambien.  I discontinued Ambien and sent a prescription for Valium. We discussed the different things that she can do to help her sleep including turning down her activity exercising and using apps like Insight timer.  She will use Valium very sparingly to prevent tolerance.  Breast cancer surveillance: Patient will need mammograms in November 2021    We will see her back with every other Herceptin.      No orders of the defined types were placed in this encounter.  The patient has a good understanding of the overall plan. she agrees with it. she will call with any problems that may develop before the next visit here.  Total time spent: 30 mins including face to face time and time spent for planning, charting and coordination of care  Nicholas Lose, MD 04/14/2020  I, Cloyde Reams Dorshimer, am acting as scribe for Dr. Nicholas Lose.  I have  reviewed the above documentation for accuracy and completeness, and I agree with the above.

## 2020-04-14 ENCOUNTER — Encounter: Payer: Self-pay | Admitting: *Deleted

## 2020-04-14 ENCOUNTER — Inpatient Hospital Stay: Payer: No Typology Code available for payment source

## 2020-04-14 ENCOUNTER — Other Ambulatory Visit: Payer: Self-pay

## 2020-04-14 ENCOUNTER — Inpatient Hospital Stay: Payer: No Typology Code available for payment source | Attending: Hematology and Oncology

## 2020-04-14 ENCOUNTER — Inpatient Hospital Stay: Payer: No Typology Code available for payment source | Admitting: Nutrition

## 2020-04-14 ENCOUNTER — Inpatient Hospital Stay (HOSPITAL_BASED_OUTPATIENT_CLINIC_OR_DEPARTMENT_OTHER): Payer: No Typology Code available for payment source | Admitting: Hematology and Oncology

## 2020-04-14 DIAGNOSIS — R232 Flushing: Secondary | ICD-10-CM | POA: Insufficient documentation

## 2020-04-14 DIAGNOSIS — Z79899 Other long term (current) drug therapy: Secondary | ICD-10-CM | POA: Diagnosis not present

## 2020-04-14 DIAGNOSIS — C50311 Malignant neoplasm of lower-inner quadrant of right female breast: Secondary | ICD-10-CM | POA: Diagnosis not present

## 2020-04-14 DIAGNOSIS — Z923 Personal history of irradiation: Secondary | ICD-10-CM | POA: Insufficient documentation

## 2020-04-14 DIAGNOSIS — R5383 Other fatigue: Secondary | ICD-10-CM | POA: Insufficient documentation

## 2020-04-14 DIAGNOSIS — Z9884 Bariatric surgery status: Secondary | ICD-10-CM | POA: Insufficient documentation

## 2020-04-14 DIAGNOSIS — G4700 Insomnia, unspecified: Secondary | ICD-10-CM | POA: Insufficient documentation

## 2020-04-14 DIAGNOSIS — Z9221 Personal history of antineoplastic chemotherapy: Secondary | ICD-10-CM | POA: Diagnosis not present

## 2020-04-14 DIAGNOSIS — Z170 Estrogen receptor positive status [ER+]: Secondary | ICD-10-CM | POA: Insufficient documentation

## 2020-04-14 DIAGNOSIS — Z79811 Long term (current) use of aromatase inhibitors: Secondary | ICD-10-CM | POA: Insufficient documentation

## 2020-04-14 DIAGNOSIS — R82998 Other abnormal findings in urine: Secondary | ICD-10-CM

## 2020-04-14 DIAGNOSIS — Z5112 Encounter for antineoplastic immunotherapy: Secondary | ICD-10-CM | POA: Diagnosis present

## 2020-04-14 LAB — URINALYSIS, COMPLETE (UACMP) WITH MICROSCOPIC
Bilirubin Urine: NEGATIVE
Glucose, UA: NEGATIVE mg/dL
Ketones, ur: NEGATIVE mg/dL
Leukocytes,Ua: NEGATIVE
Nitrite: NEGATIVE
Protein, ur: NEGATIVE mg/dL
Specific Gravity, Urine: 1.001 — ABNORMAL LOW (ref 1.005–1.030)
pH: 7 (ref 5.0–8.0)

## 2020-04-14 LAB — CBC WITH DIFFERENTIAL (CANCER CENTER ONLY)
Abs Immature Granulocytes: 0 10*3/uL (ref 0.00–0.07)
Basophils Absolute: 0 10*3/uL (ref 0.0–0.1)
Basophils Relative: 1 %
Eosinophils Absolute: 0.2 10*3/uL (ref 0.0–0.5)
Eosinophils Relative: 7 %
HCT: 35.4 % — ABNORMAL LOW (ref 36.0–46.0)
Hemoglobin: 12 g/dL (ref 12.0–15.0)
Immature Granulocytes: 0 %
Lymphocytes Relative: 25 %
Lymphs Abs: 0.5 10*3/uL — ABNORMAL LOW (ref 0.7–4.0)
MCH: 30 pg (ref 26.0–34.0)
MCHC: 33.9 g/dL (ref 30.0–36.0)
MCV: 88.5 fL (ref 80.0–100.0)
Monocytes Absolute: 0.2 10*3/uL (ref 0.1–1.0)
Monocytes Relative: 11 %
Neutro Abs: 1.2 10*3/uL — ABNORMAL LOW (ref 1.7–7.7)
Neutrophils Relative %: 56 %
Platelet Count: 198 10*3/uL (ref 150–400)
RBC: 4 MIL/uL (ref 3.87–5.11)
RDW: 13.1 % (ref 11.5–15.5)
WBC Count: 2.1 10*3/uL — ABNORMAL LOW (ref 4.0–10.5)
nRBC: 0 % (ref 0.0–0.2)

## 2020-04-14 LAB — CMP (CANCER CENTER ONLY)
ALT: 24 U/L (ref 0–44)
AST: 25 U/L (ref 15–41)
Albumin: 3.9 g/dL (ref 3.5–5.0)
Alkaline Phosphatase: 80 U/L (ref 38–126)
Anion gap: 7 (ref 5–15)
BUN: 9 mg/dL (ref 6–20)
CO2: 25 mmol/L (ref 22–32)
Calcium: 9.8 mg/dL (ref 8.9–10.3)
Chloride: 106 mmol/L (ref 98–111)
Creatinine: 0.69 mg/dL (ref 0.44–1.00)
GFR, Estimated: >60 mL/min (ref >60)
Glucose, Bld: 89 mg/dL (ref 70–99)
Potassium: 3.3 mmol/L — ABNORMAL LOW (ref 3.5–5.1)
Sodium: 138 mmol/L (ref 135–145)
Total Bilirubin: 0.7 mg/dL (ref 0.3–1.2)
Total Protein: 6.7 g/dL (ref 6.5–8.1)

## 2020-04-14 MED ORDER — SODIUM CHLORIDE 0.9 % IV SOLN
Freq: Once | INTRAVENOUS | Status: DC
  Filled 2020-04-14: qty 250

## 2020-04-14 MED ORDER — DIPHENHYDRAMINE HCL 25 MG PO CAPS
50.0000 mg | ORAL_CAPSULE | Freq: Once | ORAL | Status: AC
  Administered 2020-04-14: 50 mg via ORAL

## 2020-04-14 MED ORDER — DIPHENHYDRAMINE HCL 25 MG PO CAPS
ORAL_CAPSULE | ORAL | Status: AC
  Filled 2020-04-14: qty 1

## 2020-04-14 MED ORDER — ACETAMINOPHEN 325 MG PO TABS
650.0000 mg | ORAL_TABLET | Freq: Once | ORAL | Status: AC
  Administered 2020-04-14: 650 mg via ORAL

## 2020-04-14 MED ORDER — TRASTUZUMAB-HYALURONIDASE-OYSK 600-10000 MG-UNT/5ML ~~LOC~~ SOLN
600.0000 mg | Freq: Once | SUBCUTANEOUS | Status: AC
  Administered 2020-04-14: 600 mg via SUBCUTANEOUS
  Filled 2020-04-14: qty 5

## 2020-04-14 MED ORDER — DIAZEPAM 10 MG PO TABS: 10.0000 mg | ORAL_TABLET | Freq: Four times a day (QID) | ORAL | 3 refills | Status: DC | PRN

## 2020-04-14 MED ORDER — ACETAMINOPHEN 325 MG PO TABS
ORAL_TABLET | ORAL | Status: AC
  Filled 2020-04-14: qty 2

## 2020-04-14 NOTE — Patient Instructions (Signed)
[facility] Discharge Instructions for Patients Receiving Chemotherapy  Today you received the following chemotherapy agents Trastuzumab-hyaluronidase-oysk (HERCEPTIN HYLECTA).  To help prevent nausea and vomiting after your treatment, we encourage you to take your nausea medication as prescribed.  If you develop nausea and vomiting that is not controlled by your nausea medication, call the clinic.   BELOW ARE SYMPTOMS THAT SHOULD BE REPORTED IMMEDIATELY:  *FEVER GREATER THAN 100.5 F  *CHILLS WITH OR WITHOUT FEVER  NAUSEA AND VOMITING THAT IS NOT CONTROLLED WITH YOUR NAUSEA MEDICATION  *UNUSUAL SHORTNESS OF BREATH  *UNUSUAL BRUISING OR BLEEDING  TENDERNESS IN MOUTH AND THROAT WITH OR WITHOUT PRESENCE OF ULCERS  *URINARY PROBLEMS  *BOWEL PROBLEMS  UNUSUAL RASH Items with * indicate a potential emergency and should be followed up as soon as possible.  Feel free to call the clinic should you have any questions or concerns. The clinic phone number is [phone].  Please show the CHEMO ALERT CARD at check-in to the Emergency Department and triage nurse.   

## 2020-04-14 NOTE — Assessment & Plan Note (Signed)
05/15/2019:Patient palpated a right breast lump for two years. Diagnostic mammogram showed a 4.5cm right breast mass at the 6 o'clock position involving the overlying skin, 2 benign appearing left breast mass at the 10 o'clock position measuring 2.4cm and 2.1cm, and no axillary adenopathy. 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 Maintenance Herceptin subcutaneously every three weeks x 1 year starting 12/09/2019 3.Adjuvant radiation beginning 12/31/2019 4.Follow-up adjuvant antiestrogen therapyBSO scheduled 7/31, currently on Zoladex, to start Anastrozole mid 02/2020 -----------------------------------------------------------------------------------------------------------------------------------------------  Herceptin: Will be completed November 2021 Echo on 12/16/2019 EF: 55-60% Weight loss: Prior gastric bypass surgery Current treatment: Anastrozole and Herceptin  Anastrozole toxicities: 1.  Hot flashes  Breast cancer surveillance: Patient will need mammograms in November 2021    We will see her back with every other Herceptin.

## 2020-04-14 NOTE — Progress Notes (Signed)
Nutrition follow-up completed with patient during Herceptin for breast cancer. Weight decreased and was documented as 110 pounds on October 7.  She has lost 6% of her body weight over 3 months. Patient reports she has been very busy and because of that she has not eaten the way she normally does. Reports going back to work on Monday so feels like that will help her get back into her routine of smaller more frequent meals and snacks along with 2 Premier protein daily. She also states she feels she will be more compliant taking her vitamin and mineral supplementation. She has no questions or concerns.  Nutrition diagnosis: Unintentional weight loss continues.  Intervention: Educated on the importance of adequate calories and protein to minimize weight loss. Recommended patient consistently drink Premier protein intake and take multivitamin supplements secondary to gastric bypass surgery.  Monitoring, evaluation, goals: Monitor weight and oral intake.  Next visit: Wednesday, November 10 during infusion.  **Disclaimer: This note was dictated with voice recognition software. Similar sounding words can inadvertently be transcribed and this note may contain transcription errors which may not have been corrected upon publication of note.**

## 2020-04-15 ENCOUNTER — Encounter: Payer: Self-pay | Admitting: Physical Therapy

## 2020-04-15 ENCOUNTER — Ambulatory Visit: Payer: No Typology Code available for payment source | Attending: Adult Health | Admitting: Physical Therapy

## 2020-04-15 DIAGNOSIS — Z170 Estrogen receptor positive status [ER+]: Secondary | ICD-10-CM | POA: Diagnosis present

## 2020-04-15 DIAGNOSIS — R252 Cramp and spasm: Secondary | ICD-10-CM | POA: Diagnosis present

## 2020-04-15 DIAGNOSIS — C50311 Malignant neoplasm of lower-inner quadrant of right female breast: Secondary | ICD-10-CM | POA: Insufficient documentation

## 2020-04-15 DIAGNOSIS — M6281 Muscle weakness (generalized): Secondary | ICD-10-CM | POA: Insufficient documentation

## 2020-04-15 NOTE — Patient Instructions (Addendum)
[place] yoga Adriene yoga  Moisturizers . They are used in the vagina to hydrate the mucous membrane that make up the vaginal canal. . Designed to keep a more normal acid balance (ph) . Once placed in the vagina, it will last between two to three days.  . Use 2-3 times per week at bedtime  . Ingredients to avoid is glycerin and fragrance, can increase chance of infection . Should not be used just before sex due to causing irritation . Most are gels administered either in a tampon-shaped applicator or as a vaginal suppository. They are non-hormonal.   Types of Moisturizers(internal use)  . Vitamin E vaginal suppositories- Whole foods, Amazon . Moist Again . Coconut oil- can break down condoms . Julva- (Do no use if on Tamoxifen) amazon . Yes moisturizer- amazon . NeuEve Silk , NeuEve Silver for menopausal or over 65 (if have severe vaginal atrophy or cancer treatments use NeuEve Silk for  1 month than move to [employer])- [employer], MapleFlower.dk . Olive and Bee intimate cream- www.oliveandbee.com.au . Mae vaginal [place] . Aloe .    Creams to use externally on the Vulva area  Albertson's (good for for cancer patients that had radiation to the area)- [place] or [employer].FlyingBasics.com.br  V-magic cream - amazon  Julva-amazon  Vital "V [place] salve ( help moisturize and help with thinning vulvar area, does have Bryant by Irwin Brakeman labial moisturizer ([place],   Coconut or olive oil  aloe   Things to avoid in the vaginal area . Do not use things to irritate the vulvar area . No lotions just specialized creams for the vulva area- Neogyn, V-magic, No soaps; can use Aveeno or Calendula cleanser if needed. Must be gentle . No deodorants . No douches . Good to sleep without underwear to let the vaginal area to air out . No scrubbing: spread the lips to let warm water rinse over  labias and pat dry    Intimaterose.com Soulsource.com BroadcastRealtime.com.ee get [place] dilator, Amielia dilators   Vaginismus.com  PROTOCOL FOR VAGINAL DILATORS   1. Wash dilator with soap and water prior to insertion.    2. Lay on your back reclined. Knees are to be up and apart while on your bed or in the bathtub with warm water.   3. Lubricate the end of the dilator with a water-soluble lubricant.  4. Separate the labia.   5. Tense the pelvic floor muscles than relax; while relaxing, slide lubricated dilator( round side of dilator) into the vagina.  Insert toward the direction of your spine. Dilator should feel snug and no pain more than 3/10.   6. Tense muscles again while holding the dilator so it does not get pushed out; relax and slide it in a little further.   7. Try blowing out as if filling a balloon; this may relax the muscles and allow penetration.  Repeat blowing out to insert dilator further.  8. Keep dilator in for 10 minutes if tolerate, with the pelvic floor muscles relaxed to further stretch the canal.   9. Never force the dilator into the canal. 10. Once the dilator is comfortable start to move in and out, side to side, move your hips in different directions  11. 3-4 times per week 12. Progression of dilator.  a. When you are able to place dilator into vaginal canal and feel no pain or able to move  without difficulty you are ready for the next size.  b. Before you go to the next size start with the original size for 2 minutes then use the next size up for 5 minutes. c. When the next size up is easy to use, do not have to start with the smaller size.  [facility] [address], [address], [place] [address] Phone # [phone] Fax [phone]

## 2020-04-15 NOTE — Therapy (Addendum)
[facility] [address], [place], [place], [address] Phone: [phone]   Fax:  [phone]  Physical Therapy Evaluation  Patient Details  Name: Kimberly Mueller MRN: [id number] Date of Birth: [birth year] Referring Provider (PT): Charlestine Massed [place]   Encounter Date: 04/15/2020   PT End of Session - 04/15/20 1159    Visit Number 2    Date for PT Re-Evaluation 07/08/20    Authorization Type Aet[employer]    PT Start Time 1100    PT Stop Time 1155    PT Time Calculation (min) 55 min    Activity Tolerance Patient tolerated treatment well;No increased pain    Behavior During Therapy [facility] for tasks assessed/performed         It is 1 session instead of 2 for the pelvic floor.  Earlie Counts, PT 04/21/20 8:49 AM    Past Medical History:  Diagnosis Date  . Anxiety   . Breast cancer ([place])   . Cancer ([place])    breast  . Depression   . Family history of pancreatic cancer   . Family history of prostate cancer   . GERD (gastroesophageal reflux disease)     Past Surgical History:  Procedure Laterality Date  . BREAST LUMPECTOMY WITH RADIOACTIVE SEED AND SENTINEL LYMPH NODE BIOPSY Right 11/18/2019   Procedure: RIGHT BREAST SEED GUIDED LUMPECTOMY, RIGHT AXILLARY SENTINEL LYMPH NODE BIOSPY;  Surgeon: Rolm Bookbinder, MD;  Location: [place];  Service: General;  Laterality: Right;  GENERAL AND PECTORAL BLOCK  . FOREIGN BODY REMOVAL N/A 11/18/2019   Procedure: NEXPLANON REMOVAL;  Surgeon: Rolm Bookbinder, MD;  Location: [place];  Service: General;  Laterality: N/A;  GENERAL AND PECTORAL BLOCK  . GASTRIC BYPASS    . HERNIA REPAIR    . PORT-A-CATH REMOVAL N/A 11/18/2019   Procedure: REMOVAL PORT-A-CATH;  Surgeon: Rolm Bookbinder, MD;  Location: [place];  Service: General;  Laterality: N/A;  GENERAL AND PECTORAL BLOCK  . PORTACATH PLACEMENT Right 06/09/2019   Procedure:  INSERTION PORT-A-CATH WITH ULTRASOUND;  Surgeon: Rolm Bookbinder, MD;  Location: [place];  Service: General;  Laterality: Right;  . ROBOTIC ASSISTED BILATERAL SALPINGO OOPHERECTOMY Bilateral 03/08/2020   Procedure: XI ROBOTIC ASSISTED BILATERAL SALPINGO OOPHORECTOMY;  Surgeon: Everitt Amber, MD;  Location[facility];  Service: Gynecology;  Laterality: Bilateral;    There were no vitals filed for this visit.    Subjective Assessment - 04/15/20 1106    Subjective Last December 2020 was last time having intercourse. Tried to have intercourse before the surgery tried but penis could not enter and painful. Husband is being deployed in November. Increased pain with speculum.    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    Currently in Pain? Yes    Pain Score 8     Pain Location Vagina    Pain Orientation Mid    Pain Descriptors / Indicators Sharp    Pain Type Acute pain    Pain Onset More than a month ago    Pain Frequency Intermittent    Aggravating Factors  intercourse    Pain Relieving Factors no intercourse    Multiple Pain Sites Yes    Pain Score 4    Pain Location Back    Pain Orientation Lower  Pain Descriptors / Indicators Dull;Aching    Pain Type Acute pain    Pain Onset More than a month ago    Pain Frequency Intermittent    Aggravating Factors  standing    Pain Relieving Factors change position              [facility] PT Assessment - 04/15/20 0001      Assessment   Medical Diagnosis C50.311,Z17.0 Malignant neoplasm of lower-inner quadrant of right breast of female, estrogen receptor positive    Referring Provider (PT) [place]    Onset Date/Surgical Date 11/18/19    Hand Dominance Right    Prior Therapy none for pelvic floor      Precautions    Precautions Other (comment)    Precaution Comments cancer       [place] residence    Living Arrangements Spouse/significant other    Type of [place] Two level    Additional Comments split level house pt reports no difficulty ascending/descending starirs.       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     Pelvic Floor Special Questions - 04/15/20 0001    Prior Pregnancies No    Urinary Leakage No    Urinary frequency does not feel like she fully empties her bladder    Fecal incontinence --   takes stool softners   Skin Integrity Intact   dryn   Pelvic Floor Internal Exam Patient confirms identification and approves PT to assess pelvic floor and treatment    Exam Type Vaginal    Palpation able to tolerate the q-tip inserted into the vaginal canal and the therapist gloved pinky finger with mild pain.     Strength --   not tested due to being so tight                   PT Education - 04/15/20 1158    Education Details education on vaginal dilators, vaginal moisturizers, how to use dilators, diaphragmatic breathing    Person(s) Educated Patient    Methods Explanation;Demonstration;Verbal cues;Handout    Comprehension  Verbalized understanding;Returned demonstration            PT Short Term Goals - 04/15/20 1209      PT SHORT TERM GOAL #1   Title independent with initial HEP    Time 4    Period Weeks    Status New  Target Date 05/13/20      PT SHORT TERM GOAL #2   Title understnad what vaginal moisturizers are and how they improve vaginal health    Time 4    Period Weeks    Status New    Target Date 05/13/20      PT SHORT TERM GOAL #3   Title understand how to use dilators to expand the vaginal canal    Time 4    Period Weeks    Status New             PT Long Term Goals - 04/15/20 1210      PT LONG TERM GOAL #1   Title independent with advanced HEP    Baseline --    Time 12    Period Weeks    Status New    Target Date 07/08/20      PT LONG TERM GOAL #2   Title able to have penetrative sex into the vaginal canal due to elongation of the pelvic floor tissue    Baseline --    Time 12    Period Weeks    Status New    Target Date 07/08/20      PT LONG TERM GOAL #3   Title able to have a vaginal exam with a speculum due to using a dilator that is the size in the mid range    Baseline --    Time 12    Period Weeks    Status New    Target Date 07/08/20      PT LONG TERM GOAL #4   Title understand how to progress her cardiovascular and weight training program to build overall strength and endurance    Baseline --    Time 12    Period Weeks    Status New    Target Date 07/08/20                  Plan - 04/15/20 1200    Clinical Impression Statement Patient is a 34 year old with dyspareunia. Her history includes Right breast cancer estrogen positive with chemotherapy and radiation treatment, robotic-assisted laparoscopic bilateral salpingo-oophorectomy on 03/08/2020 and takes Anastrozole. Patient reports 8/10 pain with intercourse and unable to have the penis penetrate the vaginal canal. She has dryness in the vulva and vaginal area. Patient flinches when the  therapist was placing a q-tip into the canal and has pain with pinky finger placed into canal. Patient has tight hip adductors. Her lumbar extension decreased by 50%. Her hip strength is 4/5. She shakes with movement and at rest. Patient will benefit from skilled therapy to improve elongation of the pelvic floor for penile penetraction and vaginal exam.    Personal Factors and Comorbidities Comorbidity 3+;Fitness;Sex    Comorbidities right breast cancer with chemotherapy and radiation; gastric surgery 2020; Robotic assisted laparoscopic bil. salping-oophorectomy    Examination-Participation Restrictions Interpersonal Relationship    Stability/Clinical Decision Making Evolving/Moderate complexity    Clinical Decision Making Moderate    Rehab Potential Good    PT Frequency 1x / week    PT Duration 12 weeks    PT Treatment/Interventions ADLs/Self Care Home Management;Biofeedback;Cryotherapy;Moist Heat;Therapeutic activities;Therapeutic exercise;Neuromuscular re-education;Manual techniques;Patient/family education;Dry needling;Spinal Manipulations    PT Next Visit Plan go over dilators, vaginal moisture, hip adductor stretch, lumbar extension stretch, vaginal tissue work    PT Home Exercise Plan Access Code: [email or web address]    Consulted and Agree with Plan of Care Patient  Patient will benefit from skilled therapeutic intervention in order to improve the following deficits and impairments:  Decreased coordination, Decreased range of motion, Increased fascial restricitons, Pain, Decreased strength, Decreased mobility, Decreased activity tolerance  Visit Diagnosis: Malignant neoplasm of lower-inner quadrant of right breast of female, estrogen receptor positive ([place]) - Plan: PT plan of care cert/re-cert  Cramp and spasm - Plan: PT plan of care cert/re-cert  Muscle weakness (generalized) - Plan: PT plan of care cert/re-cert     Problem List Patient Active Problem List   Diagnosis Date Noted   . Port-A-Cath in place 07/22/2019  . Genetic testing 06/15/2019  . Family history of prostate cancer   . Family history of pancreatic cancer   . Malignant neoplasm of lower-inner quadrant of right breast of female, estrogen receptor positive ([place]) 05/25/2019    Earlie Counts, PT 04/15/20 12:15 PM   [facility] [address] [place], [place], [address] Phone: [phone]   Fax:  [phone]  Name: Kimberly Mueller MRN: [id number] Date of Birth: Jun 08, 1986

## 2020-04-18 ENCOUNTER — Other Ambulatory Visit: Payer: Self-pay | Admitting: Hematology and Oncology

## 2020-04-21 ENCOUNTER — Ambulatory Visit: Payer: No Typology Code available for payment source | Admitting: Physical Therapy

## 2020-04-21 ENCOUNTER — Encounter: Payer: Self-pay | Admitting: Physical Therapy

## 2020-04-21 ENCOUNTER — Other Ambulatory Visit: Payer: Self-pay

## 2020-04-21 DIAGNOSIS — M6281 Muscle weakness (generalized): Secondary | ICD-10-CM

## 2020-04-21 DIAGNOSIS — C50311 Malignant neoplasm of lower-inner quadrant of right female breast: Secondary | ICD-10-CM

## 2020-04-21 DIAGNOSIS — Z170 Estrogen receptor positive status [ER+]: Secondary | ICD-10-CM

## 2020-04-21 DIAGNOSIS — R252 Cramp and spasm: Secondary | ICD-10-CM

## 2020-04-21 NOTE — Therapy (Signed)
[facility] [address], [place], [place], [address] Phone: [phone]   Fax:  [phone]  Physical Therapy Treatment  Patient Details  Name: Kimberly Mueller MRN: [id number] Date of Birth: May 07, 1986 Referring Provider (PT): Charlestine Massed [place]   Encounter Date: 04/21/2020   PT End of Session - 04/21/20 0928    Visit Number 2    Date for PT Re-Evaluation 07/08/20    Authorization Type Aetna    PT Start Time 0845    PT Stop Time 0925    PT Time Calculation (min) 40 min    Activity Tolerance Patient tolerated treatment well;No increased pain    Behavior During Therapy [facility] for tasks assessed/performed           Past Medical History:  Diagnosis Date  . Anxiety   . Breast cancer ([place])   . Cancer ([place])    breast  . Depression   . Family history of pancreatic cancer   . Family history of prostate cancer   . GERD (gastroesophageal reflux disease)     Past Surgical History:  Procedure Laterality Date  . BREAST LUMPECTOMY WITH RADIOACTIVE SEED AND SENTINEL LYMPH NODE BIOPSY Right 11/18/2019   Procedure: RIGHT BREAST SEED GUIDED LUMPECTOMY, RIGHT AXILLARY SENTINEL LYMPH NODE BIOSPY;  Surgeon: Rolm Bookbinder, MD;  Location: [place];  Service: General;  Laterality: Right;  GENERAL AND PECTORAL BLOCK  . FOREIGN BODY REMOVAL N/A 11/18/2019   Procedure: NEXPLANON REMOVAL;  Surgeon: Rolm Bookbinder, MD;  Location: [place];  Service: General;  Laterality: N/A;  GENERAL AND PECTORAL BLOCK  . GASTRIC BYPASS    . HERNIA REPAIR    . PORT-A-CATH REMOVAL N/A 11/18/2019   Procedure: REMOVAL PORT-A-CATH;  Surgeon: Rolm Bookbinder, MD;  Location: [place];  Service: General;  Laterality: N/A;  GENERAL AND PECTORAL BLOCK  . PORTACATH PLACEMENT Right 06/09/2019   Procedure: INSERTION PORT-A-CATH WITH ULTRASOUND;  Surgeon: Rolm Bookbinder, MD;  Location: [place];  Service: General;  Laterality: Right;  . ROBOTIC ASSISTED BILATERAL SALPINGO OOPHERECTOMY Bilateral 03/08/2020   Procedure: XI ROBOTIC ASSISTED BILATERAL SALPINGO OOPHORECTOMY;  Surgeon: Everitt Amber, MD;  [facility];  Service: Gynecology;  Laterality: Bilateral;    There were no vitals filed for this visit.   Subjective Assessment - 04/21/20 0850    Subjective Vagianl moisturizers is feeling better. I got the dilators with silicone.    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    Currently in Pain? Yes    Pain Score 8     Pain Location Vagina    Pain Orientation Mid    Pain Descriptors / Indicators Sharp    Pain Type Acute pain    Pain Onset More than a month ago    Pain Frequency Intermittent    Aggravating Factors  intercourse    Pain Relieving Factors no intercourse    Multiple Pain Sites Yes    Pain Score 4    Pain Location Back    Pain Orientation Lower    Pain Descriptors / Indicators Aching;Dull    Pain Type Acute pain    Pain Onset More than a month ago    Pain Frequency Intermittent    Aggravating Factors  standing    Pain  Relieving Factors change position                          Pelvic Floor Special Questions - 04/21/20 0001    Pelvic Floor Internal Exam Patient confirms identification and approves PT to assess pelvic floor and treatment    Exam Type Vaginal             [facility] Adult PT Treatment/Exercise - 04/21/20 0001      Lumbar Exercises: Stretches   Prone on Elbows Stretch 1 rep;20 seconds    Quadruped Mid Back Stretch 3 reps;30 seconds    Quadruped Mid Back Stretch Limitations childs pose 3 ways      Lumbar Exercises: Quadruped   [place]/Old Horse 10 reps      Manual Therapy   Manual Therapy Soft tissue  mobilization;Myofascial release;Internal Pelvic Floor    Soft tissue mobilization to bilateral hip adductors to prepare for manual work around the vulva area; manual work along the vulva area    Internal Pelvic Floor along the vulva area and into the introitus to the DIP of the index finger, release of the perineal body on the outside and inner portion                  PT Education - 04/21/20 0928    Education Details Access Code: [email or web address]    Person(s) Educated Patient    Methods Explanation;Demonstration;Verbal cues;Handout    Comprehension Returned demonstration;Verbalized understanding            PT Short Term Goals - 04/21/20 0930      PT SHORT TERM GOAL #1   Title independent with initial HEP    Time 4    Period Weeks    Status On-going      PT SHORT TERM GOAL #2   Title understnad what vaginal moisturizers are and how they improve vaginal health    Time 4    Period Weeks    Status Achieved      PT SHORT TERM GOAL #3   Title understand how to use dilators to expand the vaginal canal    Time 4    Period Weeks    Status Achieved             PT Long Term Goals - 04/15/20 1210      PT LONG TERM GOAL #1   Title independent with advanced HEP    Baseline --    Time 12    Period Weeks    Status New    Target Date 07/08/20      PT LONG TERM GOAL #2   Title able to have penetrative sex into the vaginal canal due to elongation of the pelvic floor tissue    Baseline --    Time 12    Period Weeks    Status New    Target Date 07/08/20      PT LONG TERM GOAL #3   Title able to have a vaginal exam with a speculum due to using a dilator that is the size in the mid range    Baseline --    Time 12    Period Weeks    Status New    Target Date 07/08/20      PT LONG TERM GOAL #4   Title understand how to progress her cardiovascular and weight training program to build overall strength and endurance    Baseline --  Time 12    Period Weeks    Status New     Target Date 07/08/20                 Plan - 04/21/20 0858    Clinical Impression Statement Patient is using the first dilator and understands how to progress herself. Patient is moisturizing the vaginal canal and vulva area which is helping with the tissue health. Patietn was able to tolerate the therapist to place the index finger to the first DIP of the index finger for the first time. Patient will benefit from skilled therapy to improve elongation of fht pelvic floor for penile penetraction and vaginal exam.    Personal Factors and Comorbidities Comorbidity 3+;Fitness;Sex    Comorbidities right breast cancer with chemotherapy and radiation; gastric surgery 2020; Robotic assisted laparoscopic bil. salping-oophorectomy    Examination-Participation Restrictions Interpersonal Relationship    Stability/Clinical Decision Making Evolving/Moderate complexity    Rehab Potential Good    PT Frequency 1x / week    PT Duration 12 weeks    PT Treatment/Interventions ADLs/Self Care Home Management;Biofeedback;Cryotherapy;Moist Heat;Therapeutic activities;Therapeutic exercise;Neuromuscular re-education;Manual techniques;Patient/family education;Dry needling;Spinal Manipulations    PT Next Visit Plan continue with vaginal tissue work, foam roller work    PT Home Exercise Plan Access Code: [email or web address]    Recommended Other Services MD signed initial eval    Consulted and Agree with Plan of Care Patient           Patient will benefit from skilled therapeutic intervention in order to improve the following deficits and impairments:  Decreased coordination, Decreased range of motion, Increased fascial restricitons, Pain, Decreased strength, Decreased mobility, Decreased activity tolerance  Visit Diagnosis: Cramp and spasm  Muscle weakness (generalized)  Malignant neoplasm of lower-inner quadrant of right breast of female, estrogen receptor positive ([place])     Problem List Patient Active Problem  List   Diagnosis Date Noted  . Port-A-Cath in place 07/22/2019  . Genetic testing 06/15/2019  . Family history of prostate cancer   . Family history of pancreatic cancer   . Malignant neoplasm of lower-inner quadrant of right breast of female, estrogen receptor positive ([place]) 05/25/2019    Earlie Counts, PT 04/21/20 9:31 AM   [facility] [address], [place], [place], [address] Phone: [phone]   Fax:  [phone]  Name: Kimberly Mueller MRN: [id number] Date of Birth: 02/04/1986

## 2020-04-21 NOTE — Patient Instructions (Signed)
Access Code: [email or web address] URL: [email or web address] Date: 04/21/2020 Prepared by: Earlie Counts  Exercises V Sit Hip Adductor Hamstring Stretch - 1 x daily - 7 x weekly - 1 sets - 2 reps - 30 hold Child's Pose Stretch - 1 x daily - 7 x weekly - 1 sets - 1 reps - 30 sec hold Child's Pose with Sidebending - 1 x daily - 7 x weekly - 1 sets - 1 reps - 30 sec hold Cat-Camel - 1 x daily - 7 x weekly - 1 sets - 10 reps Static Prone on Elbows - 1 x daily - 7 x weekly - 1 sets - 1 reps - 15 sec hold [facility] [address] [place], [place] [address] Phone # [phone] Fax [phone]

## 2020-04-27 ENCOUNTER — Other Ambulatory Visit: Payer: Self-pay

## 2020-04-27 ENCOUNTER — Ambulatory Visit: Payer: No Typology Code available for payment source | Admitting: Physical Therapy

## 2020-04-27 ENCOUNTER — Encounter: Payer: Self-pay | Admitting: Physical Therapy

## 2020-04-27 DIAGNOSIS — C50311 Malignant neoplasm of lower-inner quadrant of right female breast: Secondary | ICD-10-CM

## 2020-04-27 DIAGNOSIS — M6281 Muscle weakness (generalized): Secondary | ICD-10-CM

## 2020-04-27 DIAGNOSIS — R252 Cramp and spasm: Secondary | ICD-10-CM

## 2020-04-27 DIAGNOSIS — Z170 Estrogen receptor positive status [ER+]: Secondary | ICD-10-CM

## 2020-04-27 NOTE — Therapy (Signed)
[facility] [address] [place], [place], [address] Phone: [phone]   Fax:  [phone]  Physical Therapy Treatment  Patient Details  Name: Kimberly Mueller MRN: [id number] Date of Birth: April 16, 1986 Referring Provider (PT): Charlestine Massed [place]   Encounter Date: 04/27/2020   PT End of Session - 04/27/20 0927    Visit Number 3    Date for PT Re-Evaluation 07/08/20    Authorization Type [employer]    Authorization Time Period 12/08/2019-12/06/2020    Authorization - Visit Number 3    Authorization - Number of Visits 120    PT Start Time 3151   came late   PT Stop Time 0925    PT Time Calculation (min) 32 min    Activity Tolerance Patient tolerated treatment well;No increased pain    Behavior During Therapy WFL for tasks assessed/performed           Past Medical History:  Diagnosis Date  . Anxiety   . Breast cancer ([place])   . Cancer ([place])    breast  . Depression   . Family history of pancreatic cancer   . Family history of prostate cancer   . GERD (gastroesophageal reflux disease)     Past Surgical History:  Procedure Laterality Date  . BREAST LUMPECTOMY WITH RADIOACTIVE SEED AND SENTINEL LYMPH NODE BIOPSY Right 11/18/2019   Procedure: RIGHT BREAST SEED GUIDED LUMPECTOMY, RIGHT AXILLARY SENTINEL LYMPH NODE BIOSPY;  Surgeon: Rolm Bookbinder, MD;  Location: [place];  Service: General;  Laterality: Right;  GENERAL AND PECTORAL BLOCK  . FOREIGN BODY REMOVAL N/A 11/18/2019   Procedure: NEXPLANON REMOVAL;  Surgeon: Rolm Bookbinder, MD;  Location: [place];  Service: General;  Laterality: N/A;  GENERAL AND PECTORAL BLOCK  . GASTRIC BYPASS    . HERNIA REPAIR    . PORT-A-CATH REMOVAL N/A 11/18/2019   Procedure: REMOVAL PORT-A-CATH;  Surgeon: Rolm Bookbinder, MD;  Location: [place];  Service: General;  Laterality: N/A;  GENERAL AND PECTORAL BLOCK  . PORTACATH  PLACEMENT Right 06/09/2019   Procedure: INSERTION PORT-A-CATH WITH ULTRASOUND;  Surgeon: Rolm Bookbinder, MD;  Location: [place];  Service: General;  Laterality: Right;  . ROBOTIC ASSISTED BILATERAL SALPINGO OOPHERECTOMY Bilateral 03/08/2020   Procedure: XI ROBOTIC ASSISTED BILATERAL SALPINGO OOPHORECTOMY;  Surgeon: Everitt Amber, MD;  Location: [facility];  Service: Gynecology;  Laterality: Bilateral;    There were no vitals filed for this visit.   Subjective Assessment - 04/27/20 0857    Subjective I am able to use the second size dilator. I did my exercises. I signed up for some fitness classes at work. I tripped over the dog onto my right side and have a bruise.    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    Currently in Pain? Yes    Pain Score 8     Pain Location Vagina    Pain Orientation Mid    Pain Descriptors / Indicators Sharp    Pain Type Acute pain    Pain Onset More than a month ago    Pain Frequency Intermittent    Aggravating Factors  intercourse    Pain Relieving Factors no intercourse    Multiple Pain Sites No  Pelvic Floor Special Questions - 04/27/20 0001    Pelvic Floor Internal Exam Patient confirms identification and approves PT to assess pelvic floor and treatment    Exam Type Vaginal    Palpation able to tolerate therapist index finger inot the canal for the length of the finger             [facility] Adult PT Treatment/Exercise - 04/27/20 0001      Lumbar Exercises: Stretches   Hip Flexor Stretch Right;Left;1 rep;60 seconds    Hip Flexor Stretch Limitations foam roller in prone    Piriformis Stretch Right;Left;1 rep;60 seconds    Piriformis Stretch Limitations with foam rolling in sitting    Other Lumbar  Stretch Exercise hip adductors with foam roll 60 seconds each in prone      Manual Therapy   Manual Therapy Internal Pelvic Floor    Internal Pelvic Floor along the vulva area on the right to release the tissue; inserted index finger and worked on the perineal body, bulbocavernosus, levator ani while monitoring for pain.                     PT Short Term Goals - 04/27/20 0930      PT SHORT TERM GOAL #1   Title independent with initial HEP    Time 4    Period Weeks    Status Achieved    Target Date 05/13/20      PT SHORT TERM GOAL #2   Title understnad what vaginal moisturizers are and how they improve vaginal health    Time 4    Period Weeks    Status Achieved    Target Date 05/13/20      PT SHORT TERM GOAL #3   Title understand how to use dilators to expand the vaginal canal    Time 4    Period Weeks    Status Achieved             PT Long Term Goals - 04/15/20 1210      PT LONG TERM GOAL #1   Title independent with advanced HEP    Baseline --    Time 12    Period Weeks    Status New    Target Date 07/08/20      PT LONG TERM GOAL #2   Title able to have penetrative sex into the vaginal canal due to elongation of the pelvic floor tissue    Baseline --    Time 12    Period Weeks    Status New    Target Date 07/08/20      PT LONG TERM GOAL #3   Title able to have a vaginal exam with a speculum due to using a dilator that is the size in the mid range    Baseline --    Time 12    Period Weeks    Status New    Target Date 07/08/20      PT LONG TERM GOAL #4   Title understand how to progress her cardiovascular and weight training program to build overall strength and endurance    Baseline --    Time 12    Period Weeks    Status New    Target Date 07/08/20                 Plan - 04/27/20 3235    Clinical Impression Statement Patient is now on the second dilator. She was able to  tolerate therapist index finger fully into the vaginal  canal without pain. Patient tissue was able to soften after the manual work. Patient is using the moisturizer on the vulva area and internal vaginal canal and feels it is helping. Patient will benefit from skilled therapy to improve elongation of the pelvic floor for penile penetration and vaginal exam.    Personal Factors and Comorbidities Comorbidity 3+;Fitness;Sex    Comorbidities right breast cancer with chemotherapy and radiation; gastric surgery 2020; Robotic assisted laparoscopic bil. salping-oophorectomy    Examination-Participation Restrictions Interpersonal Relationship    Stability/Clinical Decision Making Evolving/Moderate complexity    Rehab Potential Good    PT Frequency 1x / week    PT Duration 12 weeks    PT Treatment/Interventions ADLs/Self Care Home Management;Biofeedback;Cryotherapy;Moist Heat;Therapeutic activities;Therapeutic exercise;Neuromuscular re-education;Manual techniques;Patient/family education;Dry needling;Spinal Manipulations    PT Next Visit Plan continue with vaginal tissue work, foam roller work; bulging of the pelvic floor    PT Home Exercise Plan Access Code: [email or web address]    Consulted and Agree with Plan of Care Patient           Patient will benefit from skilled therapeutic intervention in order to improve the following deficits and impairments:  Decreased coordination, Decreased range of motion, Increased fascial restricitons, Pain, Decreased strength, Decreased mobility, Decreased activity tolerance  Visit Diagnosis: Cramp and spasm  Muscle weakness (generalized)  Malignant neoplasm of lower-inner quadrant of right breast of female, estrogen receptor positive ([place])     Problem List Patient Active Problem List   Diagnosis Date Noted  . Port-A-Cath in place 07/22/2019  . Genetic testing 06/15/2019  . Family history of prostate cancer   . Family history of pancreatic cancer   . Malignant neoplasm of lower-inner quadrant of right breast of female,  estrogen receptor positive ([place]) 05/25/2019    Earlie Counts, PT 04/27/20 9:31 AM   [facility] [address] [place], [place], [address] Phone: [phone]   Fax:  [phone]  Name: Kimberly Mueller MRN: [id number] Date of Birth: [birth year]

## 2020-05-04 ENCOUNTER — Other Ambulatory Visit: Payer: Self-pay

## 2020-05-04 ENCOUNTER — Inpatient Hospital Stay: Payer: No Typology Code available for payment source

## 2020-05-04 VITALS — BP 117/78 | HR 94 | Temp 98.6°F | Resp 17

## 2020-05-04 DIAGNOSIS — C50311 Malignant neoplasm of lower-inner quadrant of right female breast: Secondary | ICD-10-CM

## 2020-05-04 DIAGNOSIS — Z5112 Encounter for antineoplastic immunotherapy: Secondary | ICD-10-CM | POA: Diagnosis not present

## 2020-05-04 DIAGNOSIS — Z170 Estrogen receptor positive status [ER+]: Secondary | ICD-10-CM

## 2020-05-04 MED ORDER — DIPHENHYDRAMINE HCL 25 MG PO CAPS
50.0000 mg | ORAL_CAPSULE | Freq: Once | ORAL | Status: AC
  Administered 2020-05-04: 50 mg via ORAL

## 2020-05-04 MED ORDER — ACETAMINOPHEN 325 MG PO TABS
ORAL_TABLET | ORAL | Status: AC
  Filled 2020-05-04: qty 2

## 2020-05-04 MED ORDER — ACETAMINOPHEN 325 MG PO TABS
650.0000 mg | ORAL_TABLET | Freq: Once | ORAL | Status: AC
  Administered 2020-05-04: 650 mg via ORAL

## 2020-05-04 MED ORDER — TRASTUZUMAB-HYALURONIDASE-OYSK 600-10000 MG-UNT/5ML ~~LOC~~ SOLN
600.0000 mg | Freq: Once | SUBCUTANEOUS | Status: AC
  Administered 2020-05-04: 600 mg via SUBCUTANEOUS
  Filled 2020-05-04: qty 5

## 2020-05-04 MED ORDER — DIPHENHYDRAMINE HCL 25 MG PO CAPS
ORAL_CAPSULE | ORAL | Status: AC
  Filled 2020-05-04: qty 2

## 2020-05-04 NOTE — Patient Instructions (Signed)
[facility] Discharge Instructions for Patients Receiving Chemotherapy  Today you received the following chemotherapy agents Trastuzumab-hyaluronidase-oysk (HERCEPTIN HYLECTA).  To help prevent nausea and vomiting after your treatment, we encourage you to take your nausea medication as prescribed.  If you develop nausea and vomiting that is not controlled by your nausea medication, call the clinic.   BELOW ARE SYMPTOMS THAT SHOULD BE REPORTED IMMEDIATELY:  *FEVER GREATER THAN 100.5 F  *CHILLS WITH OR WITHOUT FEVER  NAUSEA AND VOMITING THAT IS NOT CONTROLLED WITH YOUR NAUSEA MEDICATION  *UNUSUAL SHORTNESS OF BREATH  *UNUSUAL BRUISING OR BLEEDING  TENDERNESS IN MOUTH AND THROAT WITH OR WITHOUT PRESENCE OF ULCERS  *URINARY PROBLEMS  *BOWEL PROBLEMS  UNUSUAL RASH Items with * indicate a potential emergency and should be followed up as soon as possible.  Feel free to call the clinic should you have any questions or concerns. The clinic phone number is [phone].  Please show the [place] at check-in to the Emergency Department and triage nurse.

## 2020-05-05 ENCOUNTER — Ambulatory Visit: Payer: No Typology Code available for payment source | Admitting: Physical Therapy

## 2020-05-05 ENCOUNTER — Encounter: Payer: Self-pay | Admitting: Physical Therapy

## 2020-05-05 DIAGNOSIS — Z170 Estrogen receptor positive status [ER+]: Secondary | ICD-10-CM

## 2020-05-05 DIAGNOSIS — M6281 Muscle weakness (generalized): Secondary | ICD-10-CM

## 2020-05-05 DIAGNOSIS — C50311 Malignant neoplasm of lower-inner quadrant of right female breast: Secondary | ICD-10-CM

## 2020-05-05 DIAGNOSIS — R252 Cramp and spasm: Secondary | ICD-10-CM

## 2020-05-05 NOTE — Therapy (Addendum)
[facility] [address], [place], [place], [address] Phone: [phone]   Fax:  [phone]  Physical Therapy Treatment  Patient Details  Name: Kimberly Mueller MRN: [id number] Date of Birth: 1985/08/04 Referring Provider (PT): Charlestine Massed [place]   Encounter Date: 05/05/2020   PT End of Session - 05/05/20 0928    Visit Number 4    Date for PT Re-Evaluation 07/08/20    Authorization Type [employer]et[employer]    Authorization Time Period 12/08/2019-12/06/2020    Authorization - Visit Number 4    Authorization - Number of Visits 120    PT Start Time 0845    PT Stop Time 0926    PT Time Calculation (min) 41 min    Activity Tolerance Patient tolerated treatment well;No increased pain    Behavior During Therapy WFL for tasks assessed/performed           Past Medical History:  Diagnosis Date  . Anxiety   . Breast cancer ([place])   . Cancer ([place])    breast  . Depression   . Family history of pancreatic cancer   . Family history of prostate cancer   . GERD (gastroesophageal reflux disease)     Past Surgical History:  Procedure Laterality Date  . BREAST LUMPECTOMY WITH RADIOACTIVE SEED AND SENTINEL LYMPH NODE BIOPSY Right 11/18/2019   Procedure: RIGHT BREAST SEED GUIDED LUMPECTOMY, RIGHT AXILLARY SENTINEL LYMPH NODE BIOSPY;  Surgeon: Rolm Bookbinder, MD;  Location: [place];  Service: General;  Laterality: Right;  GENERAL AND PECTORAL BLOCK  . FOREIGN BODY REMOVAL N/A 11/18/2019   Procedure: NEXPLANON REMOVAL;  Surgeon: Rolm Bookbinder, MD;  Location: [place];  Service: General;  Laterality: N/A;  GENERAL AND PECTORAL BLOCK  . GASTRIC BYPASS    . HERNIA REPAIR    . PORT-A-CATH REMOVAL N/A 11/18/2019   Procedure: REMOVAL PORT-A-CATH;  Surgeon: Rolm Bookbinder, MD;  Location: [place];  Service: General;  Laterality: N/A;  GENERAL AND PECTORAL BLOCK  . PORTACATH PLACEMENT  Right 06/09/2019   Procedure: INSERTION PORT-A-CATH WITH ULTRASOUND;  Surgeon: Rolm Bookbinder, MD;  Location: [place];  Service: General;  Laterality: Right;  . ROBOTIC ASSISTED BILATERAL SALPINGO OOPHERECTOMY Bilateral 03/08/2020   Procedure: XI ROBOTIC ASSISTED BILATERAL SALPINGO OOPHORECTOMY;  Surgeon: Everitt Amber, MD;  [facility];  Service: Gynecology;  Laterality: Bilateral;    There were no vitals filed for this visit.   Subjective Assessment - 05/05/20 0849    Subjective Patient on the 4 th dilator and move a little with small pain.    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    Currently in Pain? Yes    Pain Score 2     Pain Location Vagina    Pain Orientation Mid    Pain Descriptors / Indicators Sharp    Pain Type Acute pain    Pain Onset More than a month ago    Pain Frequency Intermittent    Aggravating Factors  using the dilator    Pain Relieving Factors not using the dilator    Multiple Pain Sites No                          Pelvic Floor Special Questions -  05/05/20 0001    Pelvic Floor Internal Exam Patient confirms identification and approves PT to assess pelvic floor and treatment    Exam Type Vaginal             [facility] Adult PT Treatment/Exercise - 05/05/20 0001      Self-Care   Self-Care Other Self-Care Comments    Other Self-Care Comments  instructed patient on foreplay with her husband for the vaginal tissue getting used to being touched by her husband      Lumbar Exercises: Stretches   Single Knee to Chest Stretch Right;Left;1 rep;30 seconds    Piriformis Stretch Right;Left;1 rep;30 seconds    Piriformis Stretch Limitations pigeon pose    Other Lumbar Stretch Exercise yoga poses with flexing at hips and legs apart  and lean forward then go to each side,  warrior pose, triangle pose and reverse warrior    Other Lumbar Stretch Exercise supine hip circles      Manual Therapy   Manual Therapy Soft tissue mobilization;Internal Pelvic Floor    Soft tissue mobilization bilateral hip adductors ot relax the muslces and prepare for internal vaginal  work, work o nthe perineal body to elongate the tissue    Internal Pelvic Floor along the perineal body, levator ani and bulbocavernosus                  PT Education - 05/05/20 0927    Education Details education on foreplay so her vaginal tissue gets used to being touched y her husband    Person(s) Educated Patient    Methods Explanation    Comprehension Verbalized understanding            PT Short Term Goals - 04/27/20 0930      PT SHORT TERM GOAL #1   Title independent with initial HEP    Time 4    Period Weeks    Status Achieved    Target Date 05/13/20      PT SHORT TERM GOAL #2   Title understnad what vaginal moisturizers are and how they improve vaginal health    Time 4    Period Weeks    Status Achieved    Target Date 05/13/20      PT SHORT TERM GOAL #3   Title understand how to use dilators to expand the vaginal canal    Time 4    Period Weeks    Status Achieved             PT Long Term Goals - 05/05/20 1025      PT LONG TERM GOAL #1   Title independent with advanced HEP    Time 12    Period Weeks    Status On-going      PT LONG TERM GOAL #2   Title able to have penetrative sex into the vaginal canal due to elongation of the pelvic floor tissue    Baseline not able to but using the dilators    Time 12    Period Weeks    Status On-going      PT LONG TERM GOAL #3   Title able to have a vaginal exam with a speculum due to using a dilator that is the size in the mid range    Baseline using the dilators    Time 12    Period Weeks    Status On-going      PT LONG TERM GOAL #4   Title understand how to progress  her cardiovascular and weight training program to build overall strength and endurance    Baseline doing exercises through work    Time 12    Period Weeks    Status On-going                 Plan - 05/05/20 0850    Clinical Impression Statement Patient is on the 4th dilator out of 5 with pain level 2/10. Patient vaginal tissue is alot softer from last visit. She understands about foreplay and how it cam help the vaginal tissue relax. Patient is doing videos with work to do yoga and will try cardio for 15 minutes. Patient will benefit from skilled therapy to improve elongation fo the pelvic floor for penile penetration and vaginal exam.    Personal Factors and Comorbidities Comorbidity 3+;Fitness;Sex    Comorbidities right breast cancer with chemotherapy and radiation; gastric surgery 2020; Robotic assisted laparoscopic bil. salping-oophorectomy    Examination-Participation Restrictions Interpersonal Relationship    Stability/Clinical Decision Making Evolving/Moderate complexity    Rehab Potential Good    PT Frequency 1x / week    PT Duration 12 weeks    PT Treatment/Interventions ADLs/Self Care Home Management;Biofeedback;Cryotherapy;Moist Heat;Therapeutic activities;Therapeutic exercise;Neuromuscular re-education;Manual techniques;Patient/family education;Dry needling;Spinal Manipulations    PT Next Visit Plan continue with vaginal tissue work, foam roller work; bulging of the pelvic floor    PT Home Exercise Plan Access Code: [email or web address]    Consulted and Agree with Plan of Care Patient           Patient will benefit from skilled therapeutic intervention in order to improve the following deficits and impairments:  Decreased coordination, Decreased range of motion, Increased fascial restricitons, Pain, Decreased strength, Decreased mobility, Decreased activity tolerance  Visit Diagnosis: Cramp and spasm  Muscle weakness (generalized)  Malignant neoplasm of lower-inner quadrant of  right breast of female, estrogen receptor positive ([place])     Problem List Patient Active Problem List   Diagnosis Date Noted  . Port-A-Cath in place 07/22/2019  . Genetic testing 06/15/2019  . Family history of prostate cancer   . Family history of pancreatic cancer   . Malignant neoplasm of lower-inner quadrant of right breast of female, estrogen receptor positive ([place]) 05/25/2019    Earlie Counts, PT 05/05/20 9:31 AM   [facility] [address], [place], [place], [address] Phone: [phone]   Fax:  [phone]  Name: Kimberly Mueller MRN: [id number] Date of Birth: [birth year]  PHYSICAL THERAPY DISCHARGE SUMMARY  Visits from Start of Care: 4  Current functional level related to goals / functional outcomes: See above.   Remaining deficits: See above. Unable to assess patient due to her not returning to therapy after last visit on 05/05/2020.   Education / Equipment: HEP Plan:                                                    Patient goals were not met. Patient is being discharged due to not returning since the last visit. Thank you for the referral. Earlie Counts, PT 06/16/20 5:26 PM   ?????

## 2020-05-12 ENCOUNTER — Telehealth: Payer: Self-pay | Admitting: *Deleted

## 2020-05-12 ENCOUNTER — Other Ambulatory Visit: Payer: Self-pay | Admitting: *Deleted

## 2020-05-12 MED ORDER — CIPROFLOXACIN HCL 500 MG PO TABS: 500.0000 mg | ORAL_TABLET | Freq: Two times a day (BID) | ORAL | 0 refills | Status: DC

## 2020-05-12 NOTE — Progress Notes (Signed)
Received message from [place], [place] stating pt is experiencing UTI symptoms.  Per MD pt to start Cipro 500 mg BID x7 days.  Prescription sent to pharmacy on file.  RN attempt x1 to contact pt to educate on antibiotic therapy.  No answer, LVM to return call to the office.

## 2020-05-12 NOTE — Telephone Encounter (Signed)
Pt reached out with symptoms of UTI of painful urination and frequency. Dr. Lindi Adie notified.  Cipro sent to pharmacy. Msg sent with instructions. Received understanding.

## 2020-05-16 ENCOUNTER — Encounter: Payer: Self-pay | Admitting: *Deleted

## 2020-05-17 ENCOUNTER — Telehealth: Payer: Self-pay | Admitting: Hematology and Oncology

## 2020-05-17 NOTE — Telephone Encounter (Signed)
Scheduled per 11/9 staff msg. Called and spoke with pt, confirmed 11/17 appts

## 2020-05-18 ENCOUNTER — Inpatient Hospital Stay: Payer: No Typology Code available for payment source | Admitting: Adult Health

## 2020-05-18 ENCOUNTER — Inpatient Hospital Stay: Payer: No Typology Code available for payment source

## 2020-05-24 NOTE — Progress Notes (Signed)
Patient Care Team: Jilda Panda, MD as PCP - General (Internal Medicine) Nicholas Lose, MD as Consulting Physician (Hematology and Oncology) Rolm Bookbinder, MD as Consulting Physician (General Surgery) Gery Pray, MD as Consulting Physician (Radiation Oncology) Rockwell Germany, RN as Oncology Nurse Navigator Mauro Kaufmann, RN as Oncology Nurse Navigator  DIAGNOSIS:    ICD-10-CM   1. Malignant neoplasm of lower-inner quadrant of right breast of female, estrogen receptor positive ([place])  C50.311    Z17.0     SUMMARY OF ONCOLOGIC HISTORY: Oncology History  Malignant neoplasm of lower-inner quadrant of right breast of female, estrogen receptor positive ([place])  05/25/2019 Initial Diagnosis   Patient palpated a right breast lump for two years. Diagnostic mammogram showed a 4.5cm right breast mass at the 6 o'clock position involving the overlying skin, 2 benign appearing left breast mass at the 10 o'clock position measuring 2.4cm and 2.1cm, and no axillary adenopathy. Biopsy showed: in the left breast, fibroadenoma and no evidence of malignancy, and in the right breast, IDC with DCIS, grade 3.  ER 70%, PR 30%, Ki-67 40%, HER-2 negative by [facility]    06/10/2019 - 10/28/2019 Chemotherapy   The patient had dexamethasone (DECADRON) 4 MG tablet, 4 mg (100 % of original dose 4 mg), Oral, Daily, 1 of 1 cycle, Start date: 06/05/2019, End date: 08/12/2019 Dose modification: 4 mg (original dose 4 mg, Cycle 0) DOXOrubicin (ADRIAMYCIN) chemo injection 118 mg, 60 mg/m2 = 118 mg, Intravenous,  Once, 4 of 4 cycles Dose modification: 50 mg/m2 (original dose 60 mg/m2, Cycle 2, Reason: Dose not tolerated) Administration: 118 mg (06/10/2019), 98 mg (06/24/2019), 98 mg (07/08/2019), 98 mg (07/22/2019) palonosetron (ALOXI) injection 0.25 mg, 0.25 mg, Intravenous,  Once, 4 of 4 cycles Administration: 0.25 mg (06/10/2019), 0.25 mg (06/24/2019), 0.25 mg (07/08/2019), 0.25 mg (07/22/2019) pegfilgrastim-cbqv (UDENYCA)  injection 6 mg, 6 mg, Subcutaneous, Once, 4 of 4 cycles Administration: 6 mg (06/12/2019), 6 mg (06/26/2019), 6 mg (07/10/2019), 6 mg (07/24/2019) cyclophosphamide (CYTOXAN) 1,180 mg in sodium chloride 0.9 % 250 mL chemo infusion, 600 mg/m2 = 1,180 mg, Intravenous,  Once, 4 of 4 cycles Dose modification: 500 mg/m2 (original dose 600 mg/m2, Cycle 2, Reason: Dose not tolerated) Administration: 1,180 mg (06/10/2019), 980 mg (06/24/2019), 980 mg (07/08/2019), 980 mg (07/22/2019) PACLitaxel (TAXOL) 156 mg in sodium chloride 0.9 % 250 mL chemo infusion (</= 23m/m2), 80 mg/m2 = 156 mg, Intravenous,  Once, 12 of 12 cycles Dose modification: 65 mg/m2 (original dose 80 mg/m2, Cycle 11, Reason: Provider Judgment), 50 mg/m2 (original dose 80 mg/m2, Cycle 12, Reason: Dose not tolerated) Administration: 156 mg (08/12/2019), 156 mg (08/19/2019), 138 mg (08/26/2019), 138 mg (09/02/2019), 138 mg (09/09/2019), 138 mg (09/16/2019), 114 mg (09/23/2019), 90 mg (09/30/2019), 90 mg (10/07/2019), 90 mg (10/14/2019), 90 mg (10/21/2019), 90 mg (10/28/2019) fosaprepitant (EMEND) 150 mg, dexamethasone (DECADRON) 12 mg in sodium chloride 0.9 % 145 mL IVPB, , Intravenous,  Once, 4 of 4 cycles Administration:  (06/10/2019),  (06/24/2019),  (07/08/2019),  (07/22/2019)  for chemotherapy treatment.     Genetic Testing   No pathogenic variants identified. VUS in [employer] c.2378A>G identified on the [facility] Common Hereditary Cancers Panel. The report date is 06/15/2019.  The Common Hereditary Cancers Panel offered by [facility] includes sequencing and/or deletion duplication testing of the following 48 genes: APC, ATM, AXIN2, BARD1, BMPR1A, BRCA1, BRCA2, BRIP1, CDH1, CDKN2A (p14ARF), CDKN2A (p16INK4a), CKD4, CHEK2, CTNNA1, DICER1, EPCAM (Deletion/duplication testing only), GREM1 (promoter region deletion/duplication testing only), KIT, MEN1, MLH1, MSH2, MSH3, MSH6,  MUTYH, NBN, NF1, NHTL1, PALB2, PDGFRA, PMS2, POLD1, POLE, PTEN, RAD50, RAD51C, RAD51D, RNF43,  SDHB, SDHC, SDHD, SMAD4, SMARCA4. STK11, TP53, TSC1, TSC2, and VHL.  The following genes were evaluated for sequence changes only: SDHA and HOXB13 c.251G>A variant only.   11/18/2019 Surgery   Right lumpectomy Donne Hazel): IDC, grade 3, 1.5cm, with grade 2-3 DCIS, 2 right axillary lymph nodes negative. FISH on pathology revealed HER-2 positivity.   12/09/2019 -  Chemotherapy   The patient had trastuzumab-hyaluronidase-oysk (HERCEPTIN HYLECTA) 600-10000 MG-UNT/5ML chemo SQ injection 600 mg, 600 mg, Subcutaneous,  Once, 8 of 17 cycles Administration: 600 mg (12/09/2019), 600 mg (12/30/2019), 600 mg (01/20/2020), 600 mg (02/10/2020), 600 mg (03/02/2020), 600 mg (03/23/2020), 600 mg (04/14/2020), 600 mg (05/04/2020)  for chemotherapy treatment.    12/28/2019 - 02/12/2020 Radiation Therapy   Adjuvant radiation   02/2020 -  Anti-estrogen oral therapy   Zoladex monthly until she underwent a BSO in 02/2020; Anastrozole started 02/2020     CHIEF COMPLIANT: Follow-up of right breast cancer on anastrozole, Herceptin maintenance  INTERVAL HISTORY: Kimberly Mueller is a 34 y.o. with above-mentioned history of right breast cancerwho completedneoadjuvant chemotherapy, lumpectomy, radiation, and is currently on Herceptin maintenance and antiestrogen therapy with anastrozole. She presents to the clinic today for treatment.  She is tolerating the full dose of anastrozole very well.  She does not have any side effects to Herceptin.  Her major issues related to sleep.  Diazepam was not working for her.  She would like to go back to Ambien.  ALLERGIES:  is allergic to adhesive [tape].  MEDICATIONS:  Current Outpatient Medications  Medication Sig Dispense Refill  . acetaminophen (TYLENOL) 500 MG tablet Take 1,000 mg by mouth every 6 (six) hours as needed for mild pain or moderate pain.    Marland Kitchen anastrozole (ARIMIDEX) 1 MG tablet Take 1 tablet (1 mg total) by mouth daily. 30 tablet 6  . Calcium Carbonate-Vit D-Min (CALTRATE PLUS  PO) Take 2 tablets by mouth daily.    . ciprofloxacin (CIPRO) 500 MG tablet Take 1 tablet (500 mg total) by mouth 2 (two) times daily. 14 tablet 0  . diazepam (VALIUM) 10 MG tablet Take 1 tablet (10 mg total) by mouth every 6 (six) hours as needed for anxiety. 30 tablet 3  . diphenhydramine-acetaminophen (TYLENOL PM) 25-500 MG TABS tablet Take 2 tablets by mouth at bedtime as needed (sleep).    . DOCUSATE SODIUM PO Take 1 capsule by mouth See admin instructions. Regular strength during the day and maximin strength at bedtime    . gabapentin (NEURONTIN) 100 MG capsule TAKE 1 CAPSULE BY MOUTH AT BEDTIME. 30 capsule 3  . Multiple Vitamins-Minerals (MULTIVITAMIN WITH MINERALS) tablet Take 1 tablet by mouth daily. [place]    . NON FORMULARY Take 1 tablet by mouth at bedtime. [place] Therapist, art     . omeprazole (PRILOSEC) 40 MG capsule Take 1 capsule (40 mg total) by mouth daily. 90 capsule 3  . venlafaxine XR (EFFEXOR-XR) 37.5 MG 24 hr capsule TAKE 1 CAPSULE BY MOUTH DAILY WITH BREAKFAST. (Patient taking differently: Take 37.5 mg by mouth daily with breakfast. ) 90 capsule 2   No current facility-administered medications for this visit.    PHYSICAL EXAMINATION: ECOG PERFORMANCE STATUS: 1 - Symptomatic but completely ambulatory  Vitals:   05/25/20 1245  BP: 114/84  Pulse: (!) 59  Resp: 18  Temp: 98 F (36.7 C)  SpO2: 100%   Filed Weights   05/25/20 1245  Weight: 113  lb 4.8 oz (51.4 kg)    LABORATORY DATA:  I have reviewed the data as listed CMP Latest Ref Rng & Units 04/14/2020 03/02/2020 02/29/2020  Glucose 70 - 99 mg/dL 89 90 94  BUN 6 - 20 mg/dL '9 9 19  ' Creatinine 0.44 - 1.00 mg/dL 0.69 0.70 0.57  Sodium 135 - 145 mmol/L 138 140 138  Potassium 3.5 - 5.1 mmol/L 3.3(L) 3.9 5.3(H)  Chloride 98 - 111 mmol/L 106 104 105  CO2 22 - 32 mmol/L '25 28 28  ' Calcium 8.9 - 10.3 mg/dL 9.8 10.3 9.9  Total Protein 6.5 - 8.1 g/dL 6.7 7.0 -  Total Bilirubin 0.3 - 1.2 mg/dL 0.7 0.6 -    Alkaline Phos 38 - 126 U/L 80 84 -  AST 15 - 41 U/L 25 23 -  ALT 0 - 44 U/L 24 23 -    Lab Results  Component Value Date   WBC 4.4 05/25/2020   HGB 14.0 05/25/2020   HCT 40.9 05/25/2020   MCV 90.9 05/25/2020   PLT 244 05/25/2020   NEUTROABS 3.0 05/25/2020    ASSESSMENT & PLAN:  Malignant neoplasm of lower-inner quadrant of right breast of female, estrogen receptor positive ([place]) 05/15/2019:Patient palpated a right breast lump for two years. Diagnostic mammogram showed a 4.5cm right breast mass at the 6 o'clock position involving the overlying skin, 2 benign appearing left breast mass at the 10 o'clock position measuring 2.4cm and 2.1cm, and no axillary adenopathy. 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 Maintenance Herceptin subcutaneously every three weeks x 1 year starting 12/09/2019 3.Adjuvant radiation beginning 12/31/2019 4.Follow-up adjuvant antiestrogen therapyBSO scheduled 7/31, currently on Zoladex, to start Anastrozole mid 02/2020 ----------------------------------------------------------------------------------------------------------------------------------------------- 03/08/2020: Bilateral salpingo-oophorectomy  Herceptin: Will be completed May 2022 Echo on 12/16/2019 EF: 55-60% Weight loss: Prior gastric bypass surgery Current treatment: Anastrozole and Herceptin  Anastrozole toxicities: 1.  Hot flashes:  Improved with Effexor 2. fatigue: Slowly improving with time. Tolerating full dose of anastrozole without any major problems..  Insomnia issues: We will go back to Ambien and I sent a new prescription for that today..  Breast cancer surveillance: Patient will need mammograms in November 2021  Plastic surgery consult will be made to make her breasts more symmetrical.   We will see her back with every other Herceptin.  No orders of the defined types were placed in this encounter.  The patient has a good understanding of the overall plan. she agrees with it. she will call with any problems that may develop before the next visit here.  Total time spent: 30 mins including face to face time and time spent for planning, charting and coordination of care  Gardenia Phlegm* 05/25/2020  I, Cloyde Reams Dorshimer, am acting as scribe for Dr. Nicholas Lose.  I have reviewed the above documentation for accuracy and completeness, and I agree with the above.

## 2020-05-24 NOTE — Assessment & Plan Note (Signed)
05/15/2019:Patient palpated a right breast lump for two years. Diagnostic mammogram showed a 4.5cm right breast mass at the 6 o'clock position involving the overlying skin, 2 benign appearing left breast mass at the 10 o'clock position measuring 2.4cm and 2.1cm, and no axillary adenopathy. 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 Maintenance Herceptin subcutaneously every three weeks x 1 year starting 12/09/2019 3.Adjuvant radiation beginning 12/31/2019 4.Follow-up adjuvant antiestrogen therapyBSO scheduled 7/31, currently on Zoladex, to start Anastrozole mid 02/2020 ----------------------------------------------------------------------------------------------------------------------------------------------- 03/08/2020: Bilateral salpingo-oophorectomy  Herceptin: Will be completed May 2022 Echo on 12/16/2019 EF: 55-60% Weight loss: Prior gastric bypass surgery Current treatment: Anastrozole and Herceptin  Anastrozole toxicities: 1.  Hot flashes: Improved with Effexor 2. fatigue: Slowly improving with time. She has sold her house and moving to her sister's house and she is having to do lots of packing and unpacking.  In fact this is helped her fatigue because she is now forced to do more activity.  She is planning to go back to work soon.  Insomnia issues: She is getting tolerant to Ambien.  I discontinued Ambien and sent a prescription  for Valium. We discussed the different things that she can do to help her sleep including turning down her activity exercising and using apps like Insight timer.  She will use Valium very sparingly to prevent tolerance.  Breast cancer surveillance: Patient will need mammograms in November 2021    We will see her back with every other Herceptin.

## 2020-05-25 ENCOUNTER — Other Ambulatory Visit: Payer: Self-pay

## 2020-05-25 ENCOUNTER — Inpatient Hospital Stay: Payer: No Typology Code available for payment source

## 2020-05-25 ENCOUNTER — Inpatient Hospital Stay: Payer: No Typology Code available for payment source | Attending: Hematology and Oncology

## 2020-05-25 ENCOUNTER — Inpatient Hospital Stay (HOSPITAL_BASED_OUTPATIENT_CLINIC_OR_DEPARTMENT_OTHER): Payer: No Typology Code available for payment source | Admitting: Hematology and Oncology

## 2020-05-25 ENCOUNTER — Other Ambulatory Visit: Payer: Self-pay | Admitting: *Deleted

## 2020-05-25 DIAGNOSIS — F5101 Primary insomnia: Secondary | ICD-10-CM

## 2020-05-25 DIAGNOSIS — C50311 Malignant neoplasm of lower-inner quadrant of right female breast: Secondary | ICD-10-CM

## 2020-05-25 DIAGNOSIS — Z170 Estrogen receptor positive status [ER+]: Secondary | ICD-10-CM | POA: Insufficient documentation

## 2020-05-25 DIAGNOSIS — Z5112 Encounter for antineoplastic immunotherapy: Secondary | ICD-10-CM | POA: Insufficient documentation

## 2020-05-25 LAB — CMP (CANCER CENTER ONLY)
ALT: 32 U/L (ref 0–44)
AST: 38 U/L (ref 15–41)
Albumin: 4.3 g/dL (ref 3.5–5.0)
Alkaline Phosphatase: 88 U/L (ref 38–126)
Anion gap: 7 (ref 5–15)
BUN: 16 mg/dL (ref 6–20)
CO2: 29 mmol/L (ref 22–32)
Calcium: 10.2 mg/dL (ref 8.9–10.3)
Chloride: 104 mmol/L (ref 98–111)
Creatinine: 0.8 mg/dL (ref 0.44–1.00)
GFR, Estimated: >60 mL/min (ref >60)
Glucose, Bld: 91 mg/dL (ref 70–99)
Potassium: 5 mmol/L (ref 3.5–5.1)
Sodium: 140 mmol/L (ref 135–145)
Total Bilirubin: 0.6 mg/dL (ref 0.3–1.2)
Total Protein: 7.5 g/dL (ref 6.5–8.1)

## 2020-05-25 LAB — CBC WITH DIFFERENTIAL (CANCER CENTER ONLY)
Abs Immature Granulocytes: 0 10*3/uL (ref 0.00–0.07)
Basophils Absolute: 0 10*3/uL (ref 0.0–0.1)
Basophils Relative: 1 %
Eosinophils Absolute: 0.1 10*3/uL (ref 0.0–0.5)
Eosinophils Relative: 3 %
HCT: 40.9 % (ref 36.0–46.0)
Hemoglobin: 14 g/dL (ref 12.0–15.0)
Immature Granulocytes: 0 %
Lymphocytes Relative: 17 %
Lymphs Abs: 0.7 10*3/uL (ref 0.7–4.0)
MCH: 31.1 pg (ref 26.0–34.0)
MCHC: 34.2 g/dL (ref 30.0–36.0)
MCV: 90.9 fL (ref 80.0–100.0)
Monocytes Absolute: 0.5 10*3/uL (ref 0.1–1.0)
Monocytes Relative: 10 %
Neutro Abs: 3 10*3/uL (ref 1.7–7.7)
Neutrophils Relative %: 69 %
Platelet Count: 244 10*3/uL (ref 150–400)
RBC: 4.5 MIL/uL (ref 3.87–5.11)
RDW: 12.9 % (ref 11.5–15.5)
WBC Count: 4.4 10*3/uL (ref 4.0–10.5)
nRBC: 0 % (ref 0.0–0.2)

## 2020-05-25 MED ORDER — DIPHENHYDRAMINE HCL 25 MG PO CAPS
50.0000 mg | ORAL_CAPSULE | Freq: Once | ORAL | Status: AC
  Administered 2020-05-25: 50 mg via ORAL

## 2020-05-25 MED ORDER — TRASTUZUMAB-HYALURONIDASE-OYSK 600-10000 MG-UNT/5ML ~~LOC~~ SOLN
600.0000 mg | Freq: Once | SUBCUTANEOUS | Status: AC
  Administered 2020-05-25: 600 mg via SUBCUTANEOUS
  Filled 2020-05-25: qty 5

## 2020-05-25 MED ORDER — ACETAMINOPHEN 325 MG PO TABS
650.0000 mg | ORAL_TABLET | Freq: Once | ORAL | Status: AC
  Administered 2020-05-25: 650 mg via ORAL

## 2020-05-25 MED ORDER — BIMATOPROST 0.03 % EX SOLN: 1.0000 "application " | Freq: Every day | CUTANEOUS | 12 refills | Status: AC

## 2020-05-25 MED ORDER — ZOLPIDEM TARTRATE 10 MG PO TABS: 10.0000 mg | ORAL_TABLET | Freq: Every evening | ORAL | 3 refills | Status: DC | PRN

## 2020-05-25 MED ORDER — DIPHENHYDRAMINE HCL 25 MG PO CAPS
ORAL_CAPSULE | ORAL | Status: AC
  Filled 2020-05-25: qty 2

## 2020-05-25 MED ORDER — ACETAMINOPHEN 325 MG PO TABS
ORAL_TABLET | ORAL | Status: AC
  Filled 2020-05-25: qty 2

## 2020-05-25 NOTE — Patient Instructions (Signed)
[facility] Discharge Instructions for Patients Receiving Chemotherapy  Today you received the following chemotherapy agents Trastuzumab-hyaluronidase-oysk (HERCEPTIN HYLECTA).  To help prevent nausea and vomiting after your treatment, we encourage you to take your nausea medication as prescribed.  If you develop nausea and vomiting that is not controlled by your nausea medication, call the clinic.   BELOW ARE SYMPTOMS THAT SHOULD BE REPORTED IMMEDIATELY:  *FEVER GREATER THAN 100.5 F  *CHILLS WITH OR WITHOUT FEVER  NAUSEA AND VOMITING THAT IS NOT CONTROLLED WITH YOUR NAUSEA MEDICATION  *UNUSUAL SHORTNESS OF BREATH  *UNUSUAL BRUISING OR BLEEDING  TENDERNESS IN MOUTH AND THROAT WITH OR WITHOUT PRESENCE OF ULCERS  *URINARY PROBLEMS  *BOWEL PROBLEMS  UNUSUAL RASH Items with * indicate a potential emergency and should be followed up as soon as possible.  Feel free to call the clinic should you have any questions or concerns. The clinic phone number is [phone].  Please show the [place] at check-in to the Emergency Department and triage nurse.

## 2020-05-25 NOTE — Progress Notes (Signed)
Nutrition Follow-up:   Patient with breast cancer.  Receiving herceptin.  Met with patient during infusion.  Reports appetite is good.  She has gotten moved into sister's pool house.  Her husband leaves for the [employer] on 11/22.  She is back at work and has more of a routine.  She says she is taking bariatric MVI and calcium, may miss some on the weekend.  Reports eating eggs and bacon for breakfast, roasted vegetables, rice, meats.  Drinking premier protein shakes 1-3 per day.      Medications: reviewed  Labs: reviewed  Anthropometrics:   Weight 113 lb increased from 110 lb on 10/7   NUTRITION DIAGNOSIS: Unintentional weight loss improving   INTERVENTION:  Continue with well-balanced diet and adequate calories and protein to prevent weight loss.  Continue premier protein shakes    MONITORING, EVALUATION, GOAL: weight trends, intake   NEXT VISIT: Dec 29 during infusion  Kaden Dunkel B. Zenia Resides, [place], [place] Registered Dietitian [phone] (mobile)

## 2020-05-25 NOTE — Progress Notes (Signed)
Referral faxed to Dr. Irene Limbo for plastics consult

## 2020-05-27 ENCOUNTER — Telehealth: Payer: Self-pay | Admitting: Hematology and Oncology

## 2020-05-27 NOTE — Telephone Encounter (Signed)
Scheduled appts per 11/17 los. Left voicemail for pt to look at my chart for appts.

## 2020-06-15 ENCOUNTER — Inpatient Hospital Stay: Payer: No Typology Code available for payment source

## 2020-06-15 ENCOUNTER — Other Ambulatory Visit: Payer: Self-pay

## 2020-06-15 ENCOUNTER — Inpatient Hospital Stay: Payer: No Typology Code available for payment source | Attending: Hematology and Oncology

## 2020-06-15 VITALS — BP 120/82 | HR 87 | Temp 99.1°F | Resp 18 | Wt 111.0 lb

## 2020-06-15 DIAGNOSIS — Z170 Estrogen receptor positive status [ER+]: Secondary | ICD-10-CM | POA: Insufficient documentation

## 2020-06-15 DIAGNOSIS — C50311 Malignant neoplasm of lower-inner quadrant of right female breast: Secondary | ICD-10-CM

## 2020-06-15 DIAGNOSIS — Z5112 Encounter for antineoplastic immunotherapy: Secondary | ICD-10-CM | POA: Diagnosis present

## 2020-06-15 DIAGNOSIS — C50312 Malignant neoplasm of lower-inner quadrant of left female breast: Secondary | ICD-10-CM | POA: Insufficient documentation

## 2020-06-15 LAB — CBC WITH DIFFERENTIAL (CANCER CENTER ONLY)
Abs Immature Granulocytes: 0 10*3/uL (ref 0.00–0.07)
Basophils Absolute: 0 10*3/uL (ref 0.0–0.1)
Basophils Relative: 1 %
Eosinophils Absolute: 0.2 10*3/uL (ref 0.0–0.5)
Eosinophils Relative: 8 %
HCT: 40.1 % (ref 36.0–46.0)
Hemoglobin: 13.6 g/dL (ref 12.0–15.0)
Immature Granulocytes: 0 %
Lymphocytes Relative: 27 %
Lymphs Abs: 0.7 10*3/uL (ref 0.7–4.0)
MCH: 30.8 pg (ref 26.0–34.0)
MCHC: 33.9 g/dL (ref 30.0–36.0)
MCV: 90.9 fL (ref 80.0–100.0)
Monocytes Absolute: 0.3 10*3/uL (ref 0.1–1.0)
Monocytes Relative: 11 %
Neutro Abs: 1.3 10*3/uL — ABNORMAL LOW (ref 1.7–7.7)
Neutrophils Relative %: 53 %
Platelet Count: 211 10*3/uL (ref 150–400)
RBC: 4.41 MIL/uL (ref 3.87–5.11)
RDW: 12 % (ref 11.5–15.5)
WBC Count: 2.5 10*3/uL — ABNORMAL LOW (ref 4.0–10.5)
nRBC: 0 % (ref 0.0–0.2)

## 2020-06-15 LAB — CMP (CANCER CENTER ONLY)
ALT: 26 U/L (ref 0–44)
AST: 31 U/L (ref 15–41)
Albumin: 4.1 g/dL (ref 3.5–5.0)
Alkaline Phosphatase: 84 U/L (ref 38–126)
Anion gap: 5 (ref 5–15)
BUN: 19 mg/dL (ref 6–20)
CO2: 30 mmol/L (ref 22–32)
Calcium: 10.2 mg/dL (ref 8.9–10.3)
Chloride: 103 mmol/L (ref 98–111)
Creatinine: 0.73 mg/dL (ref 0.44–1.00)
GFR, Estimated: >60 mL/min (ref >60)
Glucose, Bld: 68 mg/dL — ABNORMAL LOW (ref 70–99)
Potassium: 4.4 mmol/L (ref 3.5–5.1)
Sodium: 138 mmol/L (ref 135–145)
Total Bilirubin: 0.6 mg/dL (ref 0.3–1.2)
Total Protein: 7.1 g/dL (ref 6.5–8.1)

## 2020-06-15 MED ORDER — DIPHENHYDRAMINE HCL 25 MG PO CAPS
ORAL_CAPSULE | ORAL | Status: AC
  Filled 2020-06-15: qty 2

## 2020-06-15 MED ORDER — TRASTUZUMAB-HYALURONIDASE-OYSK 600-10000 MG-UNT/5ML ~~LOC~~ SOLN
600.0000 mg | Freq: Once | SUBCUTANEOUS | Status: AC
  Administered 2020-06-15: 600 mg via SUBCUTANEOUS
  Filled 2020-06-15: qty 5

## 2020-06-15 MED ORDER — DIPHENHYDRAMINE HCL 25 MG PO CAPS
50.0000 mg | ORAL_CAPSULE | Freq: Once | ORAL | Status: AC
  Administered 2020-06-15: 50 mg via ORAL

## 2020-06-15 MED ORDER — ACETAMINOPHEN 325 MG PO TABS
ORAL_TABLET | ORAL | Status: AC
  Filled 2020-06-15: qty 2

## 2020-06-15 MED ORDER — ACETAMINOPHEN 325 MG PO TABS
650.0000 mg | ORAL_TABLET | Freq: Once | ORAL | Status: AC
  Administered 2020-06-15: 650 mg via ORAL

## 2020-06-17 DIAGNOSIS — N390 Urinary tract infection, site not specified: Secondary | ICD-10-CM | POA: Diagnosis not present

## 2020-06-17 DIAGNOSIS — N23 Unspecified renal colic: Secondary | ICD-10-CM | POA: Diagnosis not present

## 2020-06-17 DIAGNOSIS — Z87891 Personal history of nicotine dependence: Secondary | ICD-10-CM | POA: Insufficient documentation

## 2020-06-17 DIAGNOSIS — R319 Hematuria, unspecified: Secondary | ICD-10-CM | POA: Diagnosis present

## 2020-06-17 LAB — URINALYSIS, COMPLETE (UACMP) WITH MICROSCOPIC
Bilirubin Urine: NEGATIVE
Glucose, UA: NEGATIVE mg/dL
Ketones, ur: NEGATIVE mg/dL
Nitrite: NEGATIVE
Protein, ur: 100 mg/dL — AB
RBC / HPF: >50 RBC/hpf — ABNORMAL HIGH (ref 0–5)
Specific Gravity, Urine: 1.01 (ref 1.005–1.030)
pH: 6 (ref 5.0–8.0)

## 2020-06-17 NOTE — ED Triage Notes (Signed)
Pt to ED reporting recently receiving IV Chemo. Pt has felt slightly dehydrated since treatment but otherwise no complaints. Tonight at dinner pt had 1 episode of bright red urine without pain or clots. No pain at this time. No hx of kidney stones.

## 2020-06-18 ENCOUNTER — Emergency Department: Payer: No Typology Code available for payment source

## 2020-06-18 ENCOUNTER — Emergency Department
Admission: EM | Admit: 2020-06-18 | Discharge: 2020-06-18 | Disposition: A | Discharge status: Home / Self Care | Payer: No Typology Code available for payment source | Attending: Emergency Medicine | Admitting: Emergency Medicine

## 2020-06-18 DIAGNOSIS — N390 Urinary tract infection, site not specified: Secondary | ICD-10-CM

## 2020-06-18 DIAGNOSIS — N23 Unspecified renal colic: Secondary | ICD-10-CM

## 2020-06-18 DIAGNOSIS — R319 Hematuria, unspecified: Secondary | ICD-10-CM

## 2020-06-18 LAB — BASIC METABOLIC PANEL
Anion gap: 8 (ref 5–15)
BUN: 20 mg/dL (ref 6–20)
CO2: 26 mmol/L (ref 22–32)
Calcium: 9.7 mg/dL (ref 8.9–10.3)
Chloride: 103 mmol/L (ref 98–111)
Creatinine, Ser: 0.45 mg/dL (ref 0.44–1.00)
GFR, Estimated: >60 mL/min (ref >60)
Glucose, Bld: 103 mg/dL — ABNORMAL HIGH (ref 70–99)
Potassium: 4.1 mmol/L (ref 3.5–5.1)
Sodium: 137 mmol/L (ref 135–145)

## 2020-06-18 LAB — CBC WITH DIFFERENTIAL/PLATELET
Abs Immature Granulocytes: 0.01 10*3/uL (ref 0.00–0.07)
Basophils Absolute: 0 10*3/uL (ref 0.0–0.1)
Basophils Relative: 1 %
Eosinophils Absolute: 0.2 10*3/uL (ref 0.0–0.5)
Eosinophils Relative: 6 %
HCT: 35.2 % — ABNORMAL LOW (ref 36.0–46.0)
Hemoglobin: 12.2 g/dL (ref 12.0–15.0)
Immature Granulocytes: 0 %
Lymphocytes Relative: 27 %
Lymphs Abs: 1 10*3/uL (ref 0.7–4.0)
MCH: 31.3 pg (ref 26.0–34.0)
MCHC: 34.7 g/dL (ref 30.0–36.0)
MCV: 90.3 fL (ref 80.0–100.0)
Monocytes Absolute: 0.4 10*3/uL (ref 0.1–1.0)
Monocytes Relative: 12 %
Neutro Abs: 2 10*3/uL (ref 1.7–7.7)
Neutrophils Relative %: 54 %
Platelets: 207 10*3/uL (ref 150–400)
RBC: 3.9 MIL/uL (ref 3.87–5.11)
RDW: 12 % (ref 11.5–15.5)
WBC: 3.6 10*3/uL — ABNORMAL LOW (ref 4.0–10.5)
nRBC: 0 % (ref 0.0–0.2)

## 2020-06-18 MED ORDER — TAMSULOSIN HCL 0.4 MG PO CAPS
0.4000 mg | ORAL_CAPSULE | Freq: Once | ORAL | Status: AC
  Administered 2020-06-18: 0.4 mg via ORAL
  Filled 2020-06-18: qty 1

## 2020-06-18 MED ORDER — OXYCODONE-ACETAMINOPHEN 5-325 MG PO TABS: 1.0000 | ORAL_TABLET | ORAL | 0 refills | Status: DC | PRN

## 2020-06-18 MED ORDER — TAMSULOSIN HCL 0.4 MG PO CAPS: 0.4000 mg | ORAL_CAPSULE | Freq: Every day | ORAL | 0 refills | Status: DC

## 2020-06-18 MED ORDER — CEPHALEXIN 500 MG PO CAPS: 500.0000 mg | ORAL_CAPSULE | Freq: Three times a day (TID) | ORAL | 0 refills | Status: DC

## 2020-06-18 MED ORDER — ACETAMINOPHEN 325 MG PO TABS
650.0000 mg | ORAL_TABLET | Freq: Once | ORAL | Status: AC
  Administered 2020-06-18: 650 mg via ORAL
  Filled 2020-06-18: qty 2

## 2020-06-18 MED ORDER — ONDANSETRON 4 MG PO TBDP: 4.0000 mg | ORAL_TABLET | Freq: Three times a day (TID) | ORAL | 0 refills | Status: DC | PRN

## 2020-06-18 MED ORDER — CEPHALEXIN 500 MG PO CAPS
500.0000 mg | ORAL_CAPSULE | Freq: Once | ORAL | Status: AC
  Administered 2020-06-18: 500 mg via ORAL
  Filled 2020-06-18: qty 1

## 2020-06-18 MED ORDER — SODIUM CHLORIDE 0.9 % IV BOLUS
1000.0000 mL | Freq: Once | INTRAVENOUS | Status: AC
  Administered 2020-06-18: 1000 mL via INTRAVENOUS

## 2020-06-18 NOTE — ED Notes (Signed)
Pt taken to CT.

## 2020-06-18 NOTE — ED Notes (Signed)
Dr. Sung at bedside.  

## 2020-06-18 NOTE — ED Provider Notes (Signed)
[facility] Emergency Department Provider Note   ____________________________________________   Event Date/Time   First MD Initiated Contact with Patient 06/18/20 [phone]     (approximate)  I have reviewed the triage vital signs and the nursing notes.   HISTORY  Chief Complaint Hematuria (1 episode of bright red bood)    HPI Kimberly Mueller is a 34 y.o. female who presents to the ED from home with a chief complaint of hematuria. Patient has a history of breast cancer on chemotherapy, last received 06/15/2020. Reports one episode of hematuria tonight at dinner not associated with pain, nausea/vomiting. Has felt slightly dehydrated since her treatment but otherwise denies fever, cough, chest pain, shortness of breath, abdominal/flank pain, nausea or vomiting. Had some dysuria 2 weeks ago. Denies anticoagulant use     Past Medical History:  Diagnosis Date  . Anxiety   . Breast cancer ([place])   . Cancer ([place])    breast  . Depression   . Family history of pancreatic cancer   . Family history of prostate cancer   . GERD (gastroesophageal reflux disease)     Patient Active Problem List   Diagnosis Date Noted  . Port-A-Cath in place 07/22/2019  . Genetic testing 06/15/2019  . Family history of prostate cancer   . Family history of pancreatic cancer   . Malignant neoplasm of lower-inner quadrant of right breast of female, estrogen receptor positive ([place]) 05/25/2019    Past Surgical History:  Procedure Laterality Date  . BREAST LUMPECTOMY WITH RADIOACTIVE SEED AND SENTINEL LYMPH NODE BIOPSY Right 11/18/2019   Procedure: RIGHT BREAST SEED GUIDED LUMPECTOMY, RIGHT AXILLARY SENTINEL LYMPH NODE BIOSPY;  Surgeon: Rolm Bookbinder, MD;  Location: [place];  Service: General;  Laterality: Right;  GENERAL AND PECTORAL BLOCK  . FOREIGN BODY REMOVAL N/A 11/18/2019   Procedure: NEXPLANON REMOVAL;  Surgeon: Rolm Bookbinder, MD;  Location: [place];  Service: General;  Laterality: N/A;  GENERAL AND PECTORAL BLOCK  . GASTRIC BYPASS    . HERNIA REPAIR    . PORT-A-CATH REMOVAL N/A 11/18/2019   Procedure: REMOVAL PORT-A-CATH;  Surgeon: Rolm Bookbinder, MD;  Location: [place];  Service: General;  Laterality: N/A;  GENERAL AND PECTORAL BLOCK  . PORTACATH PLACEMENT Right 06/09/2019   Procedure: INSERTION PORT-A-CATH WITH ULTRASOUND;  Surgeon: Rolm Bookbinder, MD;  Location: [place];  Service: General;  Laterality: Right;  . ROBOTIC ASSISTED BILATERAL SALPINGO OOPHERECTOMY Bilateral 03/08/2020   Procedure: XI ROBOTIC ASSISTED BILATERAL SALPINGO OOPHORECTOMY;  Surgeon: Everitt Amber, MD;  Location: [facility];  Service: Gynecology;  Laterality: Bilateral;    Prior to Admission medications   Medication Sig Start Date End Date Taking? Authorizing Provider  acetaminophen (TYLENOL) 500 MG tablet Take 1,000 mg by mouth every 6 (six) hours as needed for mild pain or moderate pain.    [provider]  anastrozole (ARIMIDEX) 1 MG tablet Take 1 tablet (1 mg total) by mouth daily. 01/20/20   Causey, Charlestine Massed, NP  bimatoprost (LATISSE) 0.03 % ophthalmic solution Place 1 application into both eyes at bedtime. Place one drop on applicator and apply evenly along the skin of the upper eyelid at base of eyelashes once daily at bedtime; repeat procedure for second eye (use a clean applicator). 05/25/20   Nicholas Lose, MD  Calcium Carbonate-Vit D-Min (CALTRATE PLUS PO) Take 2 tablets by mouth daily.    [provider]  cephALEXin (KEFLEX) 500 MG capsule Take 1 capsule (  500 mg total) by mouth 3 (three) times daily. 06/18/20   Paulette Blanch, MD  diphenhydramine-acetaminophen (TYLENOL PM) 25-500 MG TABS tablet Take 2 tablets by mouth at bedtime as needed (sleep).    [provider]  DOCUSATE SODIUM PO Take 1 capsule by mouth See admin instructions. Regular strength during the day and  maximin strength at bedtime    [provider]  gabapentin (NEURONTIN) 100 MG capsule TAKE 1 CAPSULE BY MOUTH AT BEDTIME. 04/18/20   Nicholas Lose, MD  Multiple Vitamins-Minerals (MULTIVITAMIN WITH MINERALS) tablet Take 1 tablet by mouth daily. [place]    [provider]  NON FORMULARY Take 1 tablet by mouth at bedtime. [employer] control surveyor, Historical, MD  omeprazole (PRILOSEC) 40 MG capsule Take 1 capsule (40 mg total) by mouth daily. 09/30/19   Tanner, Lyndon Code., PA-C  ondansetron (ZOFRAN ODT) 4 MG disintegrating tablet Take 1 tablet (4 mg total) by mouth every 8 (eight) hours as needed for nausea or vomiting. 06/18/20   Paulette Blanch, MD  oxyCODONE-acetaminophen (PERCOCET/ROXICET) 5-325 MG tablet Take 1 tablet by mouth every 4 (four) hours as needed for severe pain. 06/18/20   Paulette Blanch, MD  tamsulosin (FLOMAX) 0.4 MG CAPS capsule Take 1 capsule (0.4 mg total) by mouth daily. 06/18/20   Paulette Blanch, MD  venlafaxine XR (EFFEXOR-XR) 37.5 MG 24 hr capsule TAKE 1 CAPSULE BY MOUTH DAILY WITH BREAKFAST. Patient taking differently: Take 37.5 mg by mouth daily with breakfast.  12/25/19   Causey, Charlestine Massed, NP  zolpidem (AMBIEN) 10 MG tablet Take 1 tablet (10 mg total) by mouth at bedtime as needed for sleep. 05/25/20 06/24/20  Nicholas Lose, MD  prochlorperazine (COMPAZINE) 10 MG tablet Take 1 tablet (10 mg total) by mouth every 6 (six) hours as needed (Nausea or vomiting). 06/05/19 11/25/19  Nicholas Lose, MD    Allergies Adhesive [tape]  Family History  Problem Relation Age of Onset  . Prostate cancer Father   . Pancreatic cancer Paternal Grandmother     Social History Social History   Tobacco Use  . Smoking status: Former Research scientist (life sciences)  . Smokeless tobacco: Never Used  Vaping Use  . Vaping Use: Every day  Substance Use Topics  . Alcohol use: Yes    Comment: occas.  . Drug use: Never    Review of Systems  Constitutional: No  fever/chills Eyes: No visual changes. ENT: No sore throat. Cardiovascular: Denies chest pain. Respiratory: Denies shortness of breath. Gastrointestinal: No abdominal pain.  No nausea, no vomiting.  No diarrhea.  No constipation. Genitourinary: Positive for hematuria. Negative for dysuria. Musculoskeletal: Negative for back pain. Skin: Negative for rash. Neurological: Negative for headaches, focal weakness or numbness.   ____________________________________________   PHYSICAL EXAM:  VITAL SIGNS: ED Triage Vitals [06/17/20 2224]  Enc Vitals Group     BP 131/85     Pulse Rate 81     Resp 18     Temp 97.7 F (36.5 C)     Temp Source Oral     SpO2 100 %     Weight      Height      Head Circumference      Peak Flow      Pain Score      Pain Loc      Pain Edu?      Excl. in [place]?     Constitutional: Alert and oriented. Well appearing and in no acute  distress. Eyes: Conjunctivae are normal. PERRL. EOMI. Head: Atraumatic. Nose: No congestion/rhinnorhea. Mouth/Throat: Mucous membranes are mildly dry.  Neck: No stridor.   Cardiovascular: Normal rate, regular rhythm. Grossly normal heart sounds.  Good peripheral circulation. Respiratory: Normal respiratory effort.  No retractions. Lungs CTAB. Gastrointestinal: Soft and nontender to light or deep palpation. No distention. No abdominal bruits. No CVA tenderness. Musculoskeletal: No lower extremity tenderness nor edema.  No joint effusions. Neurologic:  Normal speech and language. No gross focal neurologic deficits are appreciated. No gait instability. Skin:  Skin is warm, dry and intact. No rash noted. No vesicles. Psychiatric: Mood and affect are normal. Speech and behavior are normal.  ____________________________________________   LABS (all labs ordered are listed, but only abnormal results are displayed)  Labs Reviewed  URINALYSIS, COMPLETE (UACMP) WITH MICROSCOPIC - Abnormal; Notable for the following components:       Result Value   Color, Urine AMBER (*)    APPearance CLEAR (*)    Hgb urine dipstick LARGE (*)    Protein, ur 100 (*)    Leukocytes,Ua TRACE (*)    RBC / HPF >50 (*)    Bacteria, UA RARE (*)    All other components within normal limits  CBC WITH DIFFERENTIAL/PLATELET - Abnormal; Notable for the following components:   WBC 3.6 (*)    HCT 35.2 (*)    All other components within normal limits  BASIC METABOLIC PANEL - Abnormal; Notable for the following components:   Glucose, Bld 103 (*)    All other components within normal limits  URINE CULTURE   ____________________________________________  EKG  None ____________________________________________  RADIOLOGY I, Caedan Sumler J, personally viewed and evaluated these images (plain radiographs) as part of my medical decision making, as well as reviewing the written report by the radiologist.  ED MD interpretation: Nonobstructing 7 mm distal right ureteral stone  Official radiology report(s): CT Renal Stone Study  Result Date: 06/18/2020 CLINICAL DATA:  Hematuria.  History of breast cancer. EXAM: CT ABDOMEN AND PELVIS WITHOUT CONTRAST TECHNIQUE: Multidetector CT imaging of the abdomen and pelvis was performed following the standard protocol without IV contrast. COMPARISON:  CT dated 09/02/2019 FINDINGS: Lower chest: The lung bases are clear. The heart size is normal. Hepatobiliary: The liver is normal. Normal gallbladder.There is no biliary ductal dilation. Pancreas: Normal contours without ductal dilatation. No peripancreatic fluid collection. Spleen: Unremarkable. Adrenals/Urinary Tract: --Adrenal glands: Unremarkable. --Right kidney/ureter: There is minimal right-sided collecting system dilatation. There is a new calcification in the right hemipelvis measuring approximately 7 mm that is in the general region of the distal right ureter. This calcification was not visualized on the patient's CT from 09/02/2019 and therefore is suspicious for  distal nonobstructing ureteral stone in the appropriate clinical setting. Of note, there was a small punctate stone in the upper pole the right kidney on the patient's CT from February 2021 that is no longer visualized on today's study. --Left kidney/ureter: No hydronephrosis or radiopaque kidney stones. --Urinary bladder: Unremarkable. Stomach/Bowel: --Stomach/Duodenum: The patient is status post prior gastric bypass. --Small bowel: Unremarkable. --Colon: Unremarkable. --Appendix: Not visualized. No right lower quadrant inflammation or free fluid. Vascular/Lymphatic: Normal course and caliber of the major abdominal vessels. --No retroperitoneal lymphadenopathy. --No mesenteric lymphadenopathy. --No pelvic or inguinal lymphadenopathy. Reproductive: Unremarkable Other: No ascites or free air. The abdominal wall is normal. Musculoskeletal. No acute displaced fractures. There is a stable small sclerotic lesion in the right iliac bone. IMPRESSION: 1. Findings suspicious for a nonobstructing 7 mm stone  in the distal right ureter. 2. No additional stones remain within either kidney. 3. Chronic findings in the abdomen as detailed above. Electronically Signed   By: Constance Holster M.D.   On: 06/18/2020 03:48    ____________________________________________   PROCEDURES  Procedure(s) performed (including Critical Care):  Procedures   ____________________________________________   INITIAL IMPRESSION / ASSESSMENT AND PLAN / ED COURSE  As part of my medical decision making, I reviewed the following data within the [place] notes reviewed and incorporated, Labs reviewed, Old chart reviewed (06/13/2020 oncology visit), Radiograph reviewed and Notes from prior ED visits     34 year old female undergoing chemotherapy for breast cancer presenting with painless hematuria. Differential diagnosis includes, but is not limited to, ovarian cyst, ovarian torsion, acute appendicitis,  diverticulitis, urinary tract infection/pyelonephritis, endometriosis, bowel obstruction, colitis, renal colic, gastroenteritis, hernia, fibroids, endometriosis, etc.  UA demonstrates trace leukocytes, WBC 21-50, > 50 RBC. Will add urine culture, check basic lab work as patient recently underwent chemotherapy, CT renal colic study. Initiate IV fluid hydration. Patient requesting Tylenol for headache.  Clinical Course as of 06/18/20 0452  Sat Jun 18, 2020  0445 Updated patient on all laboratory and imaging results. Still denies flank or abdominal pain. Patient is afebrile, not neutropenic or with leukocytosis, no evidence of sepsis. Will place patient on Flomax, Keflex, Percocet and Zofran to use as needed. Will refer to urology for outpatient follow-up. Strict return precautions given. Patient verbalizes understanding agrees with plan of care. [JS]    Clinical Course User Index [JS] Paulette Blanch, MD     ____________________________________________   FINAL CLINICAL IMPRESSION(S) / ED DIAGNOSES  Final diagnoses:  Hematuria, unspecified type  Lower urinary tract infectious disease  Renal colic on right side     ED Discharge Orders         Ordered    tamsulosin (FLOMAX) 0.4 MG CAPS capsule  Daily        06/18/20 0450    cephALEXin (KEFLEX) 500 MG capsule  3 times daily        06/18/20 0450    oxyCODONE-acetaminophen (PERCOCET/ROXICET) 5-325 MG tablet  Every 4 hours PRN        06/18/20 0450    ondansetron (ZOFRAN ODT) 4 MG disintegrating tablet  Every 8 hours PRN        06/18/20 0450          *Please note:  Kimberly Mueller was evaluated in Emergency Department on 06/18/2020 for the symptoms described in the history of present illness. She was evaluated in the context of the global COVID-19 pandemic, which necessitated consideration that the patient might be at risk for infection with the SARS-CoV-2 virus that causes COVID-19. Institutional protocols and algorithms that pertain to  the evaluation of patients at risk for COVID-19 are in a state of rapid change based on information released by regulatory bodies including the CDC and federal and state organizations. These policies and algorithms were followed during the patient's care in the ED.  Some ED evaluations and interventions may be delayed as a result of limited staffing during and the pandemic.*   Note:  This document was prepared using Dragon voice recognition software and may include unintentional dictation errors.   Paulette Blanch, MD 06/18/20 [phone]

## 2020-06-18 NOTE — ED Notes (Signed)
RN called lab for add on urine culture to previously collected urinalysis.

## 2020-06-18 NOTE — Discharge Instructions (Signed)
1. Take antibiotic as prescribed (Keflex 500mg  3 times daily x 7 days). 2. Start Flomax daily x14 days. 3. You may take Ibuprofen as needed for mild pain, Percocet as needed for more severe pain. You may take Zofran as needed for nausea. 4. Drink plenty of water or filtered water daily. 5. Return to the ER for worsening symptoms, persistent vomiting, fever, difficulty breathing or other concerns.

## 2020-06-18 NOTE — ED Notes (Signed)
MD to bedside to update pt. Pt verbalized understanding of plan of care. NAD noted at this time.

## 2020-06-18 NOTE — ED Notes (Signed)
Patient given warm blanket at this time.  

## 2020-06-18 NOTE — ED Notes (Signed)
Pt requesting tylenol for a headache and IV fluids. MD made aware.

## 2020-06-20 LAB — URINE CULTURE: Culture: 20000 — AB

## 2020-06-21 ENCOUNTER — Encounter: Payer: Self-pay | Admitting: Urology

## 2020-06-21 ENCOUNTER — Other Ambulatory Visit: Payer: Self-pay

## 2020-06-21 ENCOUNTER — Ambulatory Visit: Payer: Self-pay | Admitting: Urology

## 2020-06-21 ENCOUNTER — Ambulatory Visit (INDEPENDENT_AMBULATORY_CARE_PROVIDER_SITE_OTHER): Payer: No Typology Code available for payment source | Admitting: Urology

## 2020-06-21 VITALS — BP 113/82 | HR 103 | Ht 62.0 in | Wt 110.0 lb

## 2020-06-21 DIAGNOSIS — N200 Calculus of kidney: Secondary | ICD-10-CM

## 2020-06-21 NOTE — Patient Instructions (Signed)
Laser Therapy for Kidney Stones Laser therapy for kidney stones is a procedure to break up small, hard mineral deposits that form in the kidney (kidney stones). The procedure is done using a device that produces a focused beam of light (laser). The laser breaks up kidney stones into pieces that are small enough to be passed out of the body through urination or removed from the body during the procedure. You may need laser therapy if you have kidney stones that are painful or block your urinary tract. This procedure is done by inserting a tube (ureteroscope) into your kidney through the urethral opening. The urethra is the part of the body that drains urine from the bladder. In women, the urethra opens above the vaginal opening. In men, the urethra opens at the tip of the penis. The ureteroscope is inserted through the urethra, and surgical instruments are moved through the bladder and the muscular tube that connects the kidney to the bladder (ureter) until they reach the kidney. Tell a health care provider about:  Any allergies you have.  All medicines you are taking, including vitamins, herbs, eye drops, creams, and over-the-counter medicines.  Any problems you or family members have had with anesthetic medicines.  Any blood disorders you have.  Any surgeries you have had.  Any medical conditions you have.  Whether you are pregnant or may be pregnant. What are the risks? Generally, this is a safe procedure. However, problems may occur, including:  Infection.  Bleeding.  Allergic reactions to medicines.  Damage to the urethra, bladder, or ureter.  Urinary tract infection (UTI).  Narrowing of the urethra (urethral stricture).  Difficulty passing urine.  Blockage of the kidney caused by a fragment of kidney stone. What happens before the procedure? Medicines  Ask your health care provider about: ? Changing or stopping your regular medicines. This is especially important if you  are taking diabetes medicines or blood thinners. ? Taking medicines such as aspirin and ibuprofen. These medicines can thin your blood. Do not take these medicines unless your health care provider tells you to take them. ? Taking over-the-counter medicines, vitamins, herbs, and supplements. Eating and drinking Follow instructions from your health care provider about eating and drinking, which may include:  8 hours before the procedure - stop eating heavy meals or foods, such as meat, fried foods, or fatty foods.  6 hours before the procedure - stop eating light meals or foods, such as toast or cereal.  6 hours before the procedure - stop drinking milk or drinks that contain milk.  2 hours before the procedure - stop drinking clear liquids. Staying hydrated Follow instructions from your health care provider about hydration, which may include:  Up to 2 hours before the procedure - you may continue to drink clear liquids, such as water, clear fruit juice, black coffee, and plain tea.  General instructions  You may have a physical exam before the procedure. You may also have tests, such as imaging tests and blood or urine tests.  If your ureter is too narrow, your health care provider may place a soft, flexible tube (stent) inside of it. The stent may be placed days or weeks before your laser therapy procedure.  Plan to have someone take you home from the hospital or clinic.  If you will be going home right after the procedure, plan to have someone stay with you for 24 hours.  Do not use any products that contain nicotine or tobacco for at least 4  weeks before the procedure. These products include cigarettes, e-cigarettes, and chewing tobacco. If you need help quitting, ask your health care provider.  Ask your health care provider: ? How your surgical site will be marked or identified. ? What steps will be taken to help prevent infection. These may include:  Removing hair at the surgery  site.  Washing skin with a germ-killing soap.  Taking antibiotic medicine. What happens during the procedure?   An IV will be inserted into one of your veins.  You will be given one or more of the following: ? A medicine to help you relax (sedative). ? A medicine to numb the area (local anesthetic). ? A medicine to make you fall asleep (general anesthetic).  A ureteroscope will be inserted into your urethra. The ureteroscope will send images to a video screen in the operating room to guide your surgeon to the area of your kidney that will be treated.  A small, flexible tube will be threaded through the ureteroscope and into your bladder and ureter, up to your kidney.  The laser device will be inserted into your kidney through the tube. Your surgeon will pulse the laser on and off to break up kidney stones.  A surgical instrument that has a tiny wire basket may be inserted through the tube into your kidney to remove the pieces of broken kidney stone. The procedure may vary among health care providers and hospitals. What happens after the procedure?  Your blood pressure, heart rate, breathing rate, and blood oxygen level will be monitored until you leave the hospital or clinic.  You will be given pain medicine as needed.  You may continue to receive antibiotics.  You may have a stent temporarily placed in your ureter.  Do not drive for 24 hours if you were given a sedative during your procedure.  You may be given a strainer to collect any stone fragments that you pass in your urine. Your health care provider may have these tested. Summary  Laser therapy for kidney stones is a procedure to break up kidney stones into pieces that are small enough to be passed out of the body through urination or removed during the procedure.  Follow instructions from your health care provider about eating and drinking before the procedure.  During the procedure, the ureteroscope will send images  to a video screen to guide your surgeon to the area of your kidney that will be treated.  Do not drive for 24 hours if you were given a sedative during your procedure. This information is not intended to replace advice given to you by your health care provider. Make sure you discuss any questions you have with your health care provider. Document Revised: 03/06/2018 Document Reviewed: 03/06/2018 Elsevier Patient Education  [place].   Ureteral Stent Implantation  Ureteral stent implantation is a procedure to insert (implant) a flexible, soft, plastic tube (stent) into a ureter. Ureters are the tube-like parts of the body that drain urine from the kidneys. The stent supports the ureter while it heals and helps to drain urine. You may have a ureteral stent implanted after having a procedure to remove a blockage from the ureter (ureterolysis or pyeloplasty). You may also have a stent implanted to open the flow of urine when you have a blockage caused by a kidney stone, tumor, blood clot, or infection. You have two ureters, one on each side of the body. The ureters connect the kidneys to the organ that holds urine  until it passes out of the body (bladder). The stent is placed so that one end is in the kidney, and one end is in the bladder. The stent is usually taken out after your ureter has healed. Depending on your condition, you may have a stent for just a few weeks, or you may have a long-term stent that will need to be replaced every few months. Tell a health care provider about:  Any allergies you have.  All medicines you are taking, including vitamins, herbs, eye drops, creams, and over-the-counter medicines.  Any problems you or family members have had with anesthetic medicines.  Any blood disorders you have.  Any surgeries you have had.  Any medical conditions you have.  Whether you are pregnant or may be pregnant. What are the risks? Generally, this is a safe procedure.  However, problems may occur, including:  Infection.  Bleeding.  Allergic reactions to medicines.  Damage to other structures or organs. Tearing (perforation) of the ureter is possible.  Movement of the stent away from where it is placed during surgery (migration). What happens before the procedure? Medicines Ask your health care provider about:  Changing or stopping your regular medicines. This is especially important if you are taking diabetes medicines or blood thinners.  Taking medicines such as aspirin and ibuprofen. These medicines can thin your blood. Do not take these medicines unless your health care provider tells you to take them.  Taking over-the-counter medicines, vitamins, herbs, and supplements. Eating and drinking Follow instructions from your health care provider about eating and drinking, which may include:  8 hours before the procedure - stop eating heavy meals or foods, such as meat, fried foods, or fatty foods.  6 hours before the procedure - stop eating light meals or foods, such as toast or cereal.  6 hours before the procedure - stop drinking milk or drinks that contain milk.  2 hours before the procedure - stop drinking clear liquids. Staying hydrated Follow instructions from your health care provider about hydration, which may include:  Up to 2 hours before the procedure - you may continue to drink clear liquids, such as water, clear fruit juice, black coffee, and plain tea. General instructions  Do not drink alcohol.  Do not use any products that contain nicotine or tobacco for at least 4 weeks before the procedure. These products include cigarettes, e-cigarettes, and chewing tobacco. If you need help quitting, ask your health care provider.  You may have an exam or testing, such as imaging or blood tests.  Ask your health care provider what steps will be taken to help prevent infection. These may include: ? Removing hair at the surgery  site. ? Washing skin with a germ-killing soap. ? Taking antibiotic medicine.  Plan to have someone take you home from the hospital or clinic.  If you will be going home right after the procedure, plan to have someone with you for 24 hours. What happens during the procedure?  An IV will be inserted into one of your veins.  You may be given a medicine to help you relax (sedative).  You may be given a medicine to make you fall asleep (general anesthetic).  A thin, tube-shaped instrument with a light and tiny camera at the end (cystoscope) will be inserted into your urethra. The urethra is the tube that drains urine from the bladder out of the body. In men, the urethra opens at the end of the penis. In women, the urethra opens   in front of the vaginal opening.  The cystoscope will be passed into your bladder.  A thin wire (guide wire) will be passed through your bladder and into your ureter. This is used to guide the stent into your ureter.  The stent will be inserted into your ureter.  The guide wire and the cystoscope will be removed.  A flexible tube (catheter) may be inserted through your urethra so that one end is in your bladder. This helps to drain urine from your bladder. The procedure may vary among hospitals and health care providers. What happens after the procedure?  Your blood pressure, heart rate, breathing rate, and blood oxygen level will be monitored until you leave the hospital or clinic.  You may continue to receive medicine and fluids through an IV.  You may have some soreness or pain in your abdomen and urethra. Medicines will be available to help you.  You will be encouraged to get up and walk around as soon as you can.  You may have a catheter draining your urine.  You will have some blood in your urine.  Do not drive for 24 hours if you were given a sedative during your procedure. Summary  Ureteral stent implantation is a procedure to insert a flexible,  soft, plastic tube (stent) into a ureter.  You may have a stent implanted to support the ureter while it heals after a procedure or to open the flow of urine if there is a blockage.  Follow instructions from your health care provider about taking medicines and about eating and drinking before the procedure.  Depending on your condition, you may have a stent for just a few weeks, or you may have a long-term stent that will need to be replaced every few months. This information is not intended to replace advice given to you by your health care provider. Make sure you discuss any questions you have with your health care provider. Document Revised: 04/01/2018 Document Reviewed: 04/02/2018 Elsevier Patient Education  2020 [employer].   Dietary Guidelines to Help Prevent Kidney Stones Kidney stones are deposits of minerals and salts that form inside your kidneys. Your risk of developing kidney stones may be greater depending on your diet, your lifestyle, the medicines you take, and whether you have certain medical conditions. Most people can reduce their chances of developing kidney stones by following the instructions below. Depending on your overall health and the type of kidney stones you tend to develop, your dietitian may give you more specific instructions. What are tips for following this plan? Reading food labels  Choose foods with "no salt added" or "low-salt" labels. Limit your sodium intake to less than 1500 mg per day.  Choose foods with calcium for each meal and snack. Try to eat about 300 mg of calcium at each meal. Foods that contain 200-500 mg of calcium per serving include: ? 8 oz (237 ml) of milk, fortified nondairy milk, and fortified fruit juice. ? 8 oz (237 ml) of kefir, yogurt, and soy yogurt. ? 4 oz (118 ml) of tofu. ? 1 oz of cheese. ? 1 cup (300 g) of dried figs. ? 1 cup (91 g) of cooked broccoli. ? 1-3 oz can of sardines or mackerel.  Most people need 1000 to 1500  mg of calcium each day. Talk to your dietitian about how much calcium is recommended for you. Shopping  Buy plenty of fresh fruits and vegetables. Most people do not need to avoid fruits and vegetables,  even if they contain nutrients that may contribute to kidney stones.  When shopping for convenience foods, choose: ? Whole pieces of fruit. ? Premade salads with dressing on the side. ? Low-fat fruit and yogurt smoothies.  Avoid buying frozen meals or prepared deli foods.  Look for foods with live cultures, such as yogurt and kefir. Cooking  Do not add salt to food when cooking. Place a salt shaker on the table and allow each person to add his or her own salt to taste.  Use vegetable protein, such as beans, textured vegetable protein (TVP), or tofu instead of meat in pasta, casseroles, and soups. Meal planning   Eat less salt, if told by your dietitian. To do this: ? Avoid eating processed or premade food. ? Avoid eating fast food.  Eat less animal protein, including cheese, meat, poultry, or fish, if told by your dietitian. To do this: ? Limit the number of times you have meat, poultry, fish, or cheese each week. Eat a diet free of meat at least 2 days a week. ? Eat only one serving each day of meat, poultry, fish, or seafood. ? When you prepare animal protein, cut pieces into small portion sizes. For most meat and fish, one serving is about the size of one deck of cards.  Eat at least 5 servings of fresh fruits and vegetables each day. To do this: ? Keep fruits and vegetables on hand for snacks. ? Eat 1 piece of fruit or a handful of berries with breakfast. ? Have a salad and fruit at lunch. ? Have two kinds of vegetables at dinner.  Limit foods that are high in a substance called oxalate. These include: ? Spinach. ? Rhubarb. ? Beets. ? Potato chips and french fries. ? Nuts.  If you regularly take a diuretic medicine, make sure to eat at least 1-2 fruits or vegetables high  in potassium each day. These include: ? Avocado. ? Banana. ? Orange, prune, carrot, or tomato juice. ? Baked potato. ? Cabbage. ? Beans and split peas. General instructions   Drink enough fluid to keep your urine clear or pale yellow. This is the most important thing you can do.  Talk to your health care provider and dietitian about taking daily supplements. Depending on your health and the cause of your kidney stones, you may be advised: ? Not to take supplements with vitamin C. ? To take a calcium supplement. ? To take a daily probiotic supplement. ? To take other supplements such as magnesium, fish oil, or vitamin B6.  Take all medicines and supplements as told by your health care provider.  Limit alcohol intake to no more than 1 drink a day for nonpregnant women and 2 drinks a day for men. One drink equals 12 oz of beer, 5 oz of wine, or 1 oz of hard liquor.  Lose weight if told by your health care provider. Work with your dietitian to find strategies and an eating plan that works best for you. What foods are not recommended? Limit your intake of the following foods, or as told by your dietitian. Talk to your dietitian about specific foods you should avoid based on the type of kidney stones and your overall health. Grains Breads. Bagels. Rolls. Baked goods. Salted crackers. Cereal. Pasta. Vegetables Spinach. Rhubarb. Beets. Canned vegetables. Angie Fava. Olives. Meats and other protein foods Nuts. Nut butters. Large portions of meat, poultry, or fish. Salted or cured meats. Deli meats. Hot dogs. Sausages. Dairy Cheese. Beverages Regular  soft drinks. Regular vegetable juice. Seasonings and other foods Seasoning blends with salt. Salad dressings. Canned soups. Soy sauce. Ketchup. Barbecue sauce. Canned pasta sauce. Casseroles. Pizza. Lasagna. Frozen meals. Potato chips. [place] fries. Summary  You can reduce your risk of kidney stones by making changes to your diet.  The most  important thing you can do is drink enough fluid. You should drink enough fluid to keep your urine clear or pale yellow.  Ask your health care provider or dietitian how much protein from animal sources you should eat each day, and also how much salt and calcium you should have each day. This information is not intended to replace advice given to you by your health care provider. Make sure you discuss any questions you have with your health care provider. Document Revised: 10/15/2018 Document Reviewed: 06/05/2016 Elsevier Patient Education  2020 [employer].

## 2020-06-21 NOTE — Progress Notes (Signed)
06/21/20 1:43 PM   Kimberly Mueller 05/16/1986 [id number]  CC: right ureteral stone, hematuria  HPI: I saw Kimberly Mueller in urology clinic today for evaluation of a 7 mm right distal ureteral stone.  She is a 34 year old female with a history of gastric bypass as well as breast cancer status post recent chemotherapy who recently was seen in the ED on 12/11 with gross hematuria and some intermittent right-sided back pain.  CT showed a 7 mm right distal ureteral stone with mild upstream hydronephrosis, BMP and CBC were benign, urinalysis showed greater than 50 RBCs, 20-50 WBCs, rare bacteria, 0-5 squamous cells, nitrite negative, trace leukocytes, and urine culture ultimately grew 20 K viridans Streptococcus.  She was discharged on Keflex.  She denies any dysuria, urinary symptoms, fevers, or chills.  She denies any prior history of stones.  She has a strong family history of kidney stones and her grandparents, parents, and siblings.   PMH: Past Medical History:  Diagnosis Date  . Anxiety   . Breast cancer ([place])   . Cancer ([place])    breast  . Depression   . Family history of pancreatic cancer   . Family history of prostate cancer   . GERD (gastroesophageal reflux disease)     Surgical History: Past Surgical History:  Procedure Laterality Date  . BREAST LUMPECTOMY WITH RADIOACTIVE SEED AND SENTINEL LYMPH NODE BIOPSY Right 11/18/2019   Procedure: RIGHT BREAST SEED GUIDED LUMPECTOMY, RIGHT AXILLARY SENTINEL LYMPH NODE BIOSPY;  Surgeon: Rolm Bookbinder, MD;  Location: [facility];  Service: General;  Laterality: Right;  GENERAL AND PECTORAL BLOCK  . FOREIGN BODY REMOVAL N/A 11/18/2019   Procedure: NEXPLANON REMOVAL;  Surgeon: Rolm Bookbinder, MD;  Location: [facility];  Service: General;  Laterality: N/A;  GENERAL AND PECTORAL BLOCK  . GASTRIC BYPASS    . HERNIA REPAIR    . PORT-A-CATH REMOVAL N/A 11/18/2019   Procedure: REMOVAL PORT-A-CATH;  Surgeon:  Rolm Bookbinder, MD;  Location: [facility];  Service: General;  Laterality: N/A;  GENERAL AND PECTORAL BLOCK  . PORTACATH PLACEMENT Right 06/09/2019   Procedure: INSERTION PORT-A-CATH WITH ULTRASOUND;  Surgeon: Rolm Bookbinder, MD;  Location: [facility];  Service: General;  Laterality: Right;  . ROBOTIC ASSISTED BILATERAL SALPINGO OOPHERECTOMY Bilateral 03/08/2020   Procedure: XI ROBOTIC ASSISTED BILATERAL SALPINGO OOPHORECTOMY;  Surgeon: Everitt Amber, MD;  Location: [facility];  Service: Gynecology;  Laterality: Bilateral;    Family History: Family History  Problem Relation Age of Onset  . Prostate cancer Father   . Pancreatic cancer Paternal Grandmother     Social History:  reports that she has quit smoking. She has never used smokeless tobacco. She reports current alcohol use. She reports that she does not use drugs.  Physical Exam: BP 113/82   Pulse (!) 103   Ht 5\' 2"  (1.575 m)   Wt 110 lb (49.9 kg)   LMP 06/09/2019 Comment: surgical menopause/preg test waiver signed  BMI 20.12 kg/m    Constitutional:  Alert and oriented, No acute distress. Cardiovascular: No clubbing, cyanosis, or edema. Respiratory: Normal respiratory effort, no increased work of breathing. GI: Abdomen is soft, nontender, nondistended, no abdominal masses  Laboratory Data: Reviewed, see HPI  Pertinent Imaging: I have personally viewed and interpreted the CT showing a 7 mm right distal ureteral stone with mild hydronephrosis.  On reviewing her last CT from February 2021 there was a 2 mm right upper pole stone that is no longer present,  there is no calcification in the pelvis, all consistent with findings of a new 7 mm right distal ureteral stone.  Assessment & Plan:   34 year old female with history of gastric bypass, breast cancer, and a 7 mm right distal ureteral stone.  We discussed various treatment options for urolithiasis including observation with or without medical  expulsive therapy, shockwave lithotripsy (SWL), ureteroscopy and laser lithotripsy with stent placement, and percutaneous nephrolithotomy.  We discussed that management is based on stone size, location, density, patient co-morbidities, and patient preference.   Stones <34mm in size have a >80% spontaneous passage rate. Data surrounding the use of tamsulosin for medical expulsive therapy is controversial, but meta analyses suggests it is most efficacious for distal stones between 5-55mm in size. Possible side effects include dizziness/lightheadedness, and retrograde ejaculation.  SWL has a lower stone free rate in a single procedure, but also a lower complication rate compared to ureteroscopy and avoids a stent and associated stent related symptoms. Possible complications include renal hematoma, steinstrasse, and need for additional treatment.  Ureteroscopy with laser lithotripsy and stent placement has a higher stone free rate than SWL in a single procedure, however increased complication rate including possible infection, ureteral injury, bleeding, and stent related morbidity. Common stent related symptoms include dysuria, urgency/frequency, and flank pain.  With her distal stone not clearly seen on [facility] CT I recommended ureteroscopy.  She has no lower urinary tract symptoms and grew a very small amount of bacteria in the urine, likely contaminant.  I recommended completing the course of Keflex, and proceeding with right ureteroscopy, laser lithotripsy, stent placement this week.  We discussed general stone prevention strategies including adequate hydration with goal of producing 2.5 L of urine daily, increasing citric acid intake, increasing calcium intake during high oxalate meals, minimizing animal protein, and decreasing salt intake. Information about dietary recommendations given today.   Right ureteroscopy, laser lithotripsy, stent placement this Friday Consider 24-hour urine in  follow-up  Nickolas Madrid, MD 06/21/2020  [facility] [address] [place], [place] [address] [phone]

## 2020-06-21 NOTE — H&P (View-Only) (Signed)
06/21/20 1:43 PM   [name] 1986/05/11 [id number]  CC: right ureteral stone, hematuria  HPI: I saw Ms. Wallander in urology clinic today for evaluation of a 7 mm right distal ureteral stone.  She is a 34 year old female with a history of gastric bypass as well as breast cancer status post recent chemotherapy who recently was seen in the ED on 12/11 with gross hematuria and some intermittent right-sided back pain.  CT showed a 7 mm right distal ureteral stone with mild upstream hydronephrosis, BMP and CBC were benign, urinalysis showed greater than 50 RBCs, 20-50 WBCs, rare bacteria, 0-5 squamous cells, nitrite negative, trace leukocytes, and urine culture ultimately grew 20 K viridans Streptococcus.  She was discharged on Keflex.  She denies any dysuria, urinary symptoms, fevers, or chills.  She denies any prior history of stones.  She has a strong family history of kidney stones and her grandparents, parents, and siblings.   PMH: Past Medical History:  Diagnosis Date  . Anxiety   . Breast cancer ([place])   . Cancer ([place])    breast  . Depression   . Family history of pancreatic cancer   . Family history of prostate cancer   . GERD (gastroesophageal reflux disease)     Surgical History: Past Surgical History:  Procedure Laterality Date  . BREAST LUMPECTOMY WITH RADIOACTIVE SEED AND SENTINEL LYMPH NODE BIOPSY Right 11/18/2019   Procedure: RIGHT BREAST SEED GUIDED LUMPECTOMY, RIGHT AXILLARY SENTINEL LYMPH NODE BIOSPY;  Surgeon: Rolm Bookbinder, MD;  Location: [facility];  Service: General;  Laterality: Right;  GENERAL AND PECTORAL BLOCK  . FOREIGN BODY REMOVAL N/A 11/18/2019   Procedure: NEXPLANON REMOVAL;  Surgeon: Rolm Bookbinder, MD;  Location: [facility];  Service: General;  Laterality: N/A;  GENERAL AND PECTORAL BLOCK  . GASTRIC BYPASS    . HERNIA REPAIR    . PORT-A-CATH REMOVAL N/A 11/18/2019   Procedure: REMOVAL PORT-A-CATH;  Surgeon:  Rolm Bookbinder, MD;  Location: [facility];  Service: General;  Laterality: N/A;  GENERAL AND PECTORAL BLOCK  . PORTACATH PLACEMENT Right 06/09/2019   Procedure: INSERTION PORT-A-CATH WITH ULTRASOUND;  Surgeon: Rolm Bookbinder, MD;  Location: [facility];  Service: General;  Laterality: Right;  . ROBOTIC ASSISTED BILATERAL SALPINGO OOPHERECTOMY Bilateral 03/08/2020   Procedure: XI ROBOTIC ASSISTED BILATERAL SALPINGO OOPHORECTOMY;  Surgeon: Everitt Amber, MD;  Location: [facility];  Service: Gynecology;  Laterality: Bilateral;    Family History: Family History  Problem Relation Age of Onset  . Prostate cancer Father   . Pancreatic cancer Paternal Grandmother     Social History:  reports that she has quit smoking. She has never used smokeless tobacco. She reports current alcohol use. She reports that she does not use drugs.  Physical Exam: BP 113/82   Pulse (!) 103   Ht 5\' 2"  (1.575 m)   Wt 110 lb (49.9 kg)   LMP 06/09/2019 Comment: surgical menopause/preg test waiver signed  BMI 20.12 kg/m    Constitutional:  Alert and oriented, No acute distress. Cardiovascular: No clubbing, cyanosis, or edema. Respiratory: Normal respiratory effort, no increased work of breathing. GI: Abdomen is soft, nontender, nondistended, no abdominal masses  Laboratory Data: Reviewed, see HPI  Pertinent Imaging: I have personally viewed and interpreted the CT showing a 7 mm right distal ureteral stone with mild hydronephrosis.  On reviewing her last CT from February 2021 there was a 2 mm right upper pole stone that is no longer present,  there is no calcification in the pelvis, all consistent with findings of a new 7 mm right distal ureteral stone.  Assessment & Plan:   34 year old female with history of gastric bypass, breast cancer, and a 7 mm right distal ureteral stone.  We discussed various treatment options for urolithiasis including observation with or without medical  expulsive therapy, shockwave lithotripsy (SWL), ureteroscopy and laser lithotripsy with stent placement, and percutaneous nephrolithotomy.  We discussed that management is based on stone size, location, density, patient co-morbidities, and patient preference.   Stones <62mm in size have a >80% spontaneous passage rate. Data surrounding the use of tamsulosin for medical expulsive therapy is controversial, but meta analyses suggests it is most efficacious for distal stones between 5-19mm in size. Possible side effects include dizziness/lightheadedness, and retrograde ejaculation.  SWL has a lower stone free rate in a single procedure, but also a lower complication rate compared to ureteroscopy and avoids a stent and associated stent related symptoms. Possible complications include renal hematoma, steinstrasse, and need for additional treatment.  Ureteroscopy with laser lithotripsy and stent placement has a higher stone free rate than SWL in a single procedure, however increased complication rate including possible infection, ureteral injury, bleeding, and stent related morbidity. Common stent related symptoms include dysuria, urgency/frequency, and flank pain.  With her distal stone not clearly seen on scout CT I recommended ureteroscopy.  She has no lower urinary tract symptoms and grew a very small amount of bacteria in the urine, likely contaminant.  I recommended completing the course of Keflex, and proceeding with right ureteroscopy, laser lithotripsy, stent placement this week.  We discussed general stone prevention strategies including adequate hydration with goal of producing 2.5 L of urine daily, increasing citric acid intake, increasing calcium intake during high oxalate meals, minimizing animal protein, and decreasing salt intake. Information about dietary recommendations given today.   Right ureteroscopy, laser lithotripsy, stent placement this Friday Consider 24-hour urine in  follow-up  Nickolas Madrid, MD 06/21/2020  [facility] [address] [place], [place] [address] [phone]

## 2020-06-22 ENCOUNTER — Other Ambulatory Visit: Payer: Self-pay | Admitting: Urology

## 2020-06-22 ENCOUNTER — Ambulatory Visit (HOSPITAL_COMMUNITY)
Admission: RE | Admit: 2020-06-22 | Discharge: 2020-06-22 | Disposition: A | Discharge status: Home / Self Care | Payer: No Typology Code available for payment source | Source: Ambulatory Visit

## 2020-06-22 ENCOUNTER — Ambulatory Visit (HOSPITAL_COMMUNITY)
Admission: RE | Admit: 2020-06-22 | Discharge: 2020-06-22 | Disposition: A | Discharge status: Home / Self Care | Payer: No Typology Code available for payment source | Source: Ambulatory Visit | Attending: Internal Medicine | Admitting: Internal Medicine

## 2020-06-22 ENCOUNTER — Ambulatory Visit (HOSPITAL_BASED_OUTPATIENT_CLINIC_OR_DEPARTMENT_OTHER)
Admission: RE | Admit: 2020-06-22 | Discharge: 2020-06-22 | Disposition: A | Discharge status: Home / Self Care | Payer: No Typology Code available for payment source | Source: Ambulatory Visit | Attending: Internal Medicine | Admitting: Internal Medicine

## 2020-06-22 ENCOUNTER — Ambulatory Visit
Admission: RE | Admit: 2020-06-22 | Discharge: 2020-06-22 | Disposition: A | Discharge status: Home / Self Care | Payer: No Typology Code available for payment source | Source: Ambulatory Visit | Attending: Hematology and Oncology | Admitting: Hematology and Oncology

## 2020-06-22 ENCOUNTER — Other Ambulatory Visit
Admission: RE | Admit: 2020-06-22 | Discharge: 2020-06-22 | Disposition: A | Discharge status: Home / Self Care | Payer: No Typology Code available for payment source | Source: Ambulatory Visit | Attending: Urology | Admitting: Urology

## 2020-06-22 ENCOUNTER — Other Ambulatory Visit (HOSPITAL_COMMUNITY): Payer: Self-pay | Admitting: Internal Medicine

## 2020-06-22 ENCOUNTER — Encounter (HOSPITAL_COMMUNITY): Payer: Self-pay | Admitting: Internal Medicine

## 2020-06-22 VITALS — BP 110/70 | HR 88 | Wt 110.2 lb

## 2020-06-22 DIAGNOSIS — Z20822 Contact with and (suspected) exposure to covid-19: Secondary | ICD-10-CM | POA: Insufficient documentation

## 2020-06-22 DIAGNOSIS — Z170 Estrogen receptor positive status [ER+]: Secondary | ICD-10-CM | POA: Insufficient documentation

## 2020-06-22 DIAGNOSIS — Z87891 Personal history of nicotine dependence: Secondary | ICD-10-CM | POA: Insufficient documentation

## 2020-06-22 DIAGNOSIS — R002 Palpitations: Secondary | ICD-10-CM | POA: Insufficient documentation

## 2020-06-22 DIAGNOSIS — K219 Gastro-esophageal reflux disease without esophagitis: Secondary | ICD-10-CM | POA: Insufficient documentation

## 2020-06-22 DIAGNOSIS — Z853 Personal history of malignant neoplasm of breast: Secondary | ICD-10-CM | POA: Insufficient documentation

## 2020-06-22 DIAGNOSIS — N200 Calculus of kidney: Secondary | ICD-10-CM

## 2020-06-22 DIAGNOSIS — C50311 Malignant neoplasm of lower-inner quadrant of right female breast: Secondary | ICD-10-CM

## 2020-06-22 DIAGNOSIS — Z79899 Other long term (current) drug therapy: Secondary | ICD-10-CM | POA: Diagnosis not present

## 2020-06-22 DIAGNOSIS — Z0189 Encounter for other specified special examinations: Secondary | ICD-10-CM | POA: Diagnosis not present

## 2020-06-22 DIAGNOSIS — Z01812 Encounter for preprocedural laboratory examination: Secondary | ICD-10-CM | POA: Insufficient documentation

## 2020-06-22 DIAGNOSIS — N6320 Unspecified lump in the left breast, unspecified quadrant: Secondary | ICD-10-CM | POA: Insufficient documentation

## 2020-06-22 LAB — SARS CORONAVIRUS 2 (TAT 6-24 HRS): SARS Coronavirus 2: NEGATIVE

## 2020-06-22 LAB — ECHOCARDIOGRAM COMPLETE
Area-P 1/2: 2.76 cm2
S' Lateral: 3 cm

## 2020-06-22 NOTE — Progress Notes (Signed)
  Echocardiogram 2D Echocardiogram has been performed.  Kimberly Mueller 06/22/2020, 11:20 AM

## 2020-06-22 NOTE — Progress Notes (Addendum)
CARDIO-ONCOLOGY CLINIC CONSULT NOTE  Referring Physician: Primary Care: Primary Cardiologist:  HPI:  Ms. Fei is 34 y.o. female with right breast cancer referred by Dr. Lindi Adie for enrollment into the Cardio-Oncology program.  Summary of oncology history as below. She has completedneoadjuvant chemotherapy with adriamycin based regimen starting in 12/20,lumpectomy, radiation. Biopsy revealed HER-2 positivity and is currently on Herceptin maintenance and antiestrogen therapy with anastrozole. She denies any history of heart disease except remote palpitations. Seen yesterday in Urology office for acute kidney stone. She is active without SOB or other CV symptoms. Works as Mudlogger for [employer]. Has occasional episodes of palpitations that last 10-15 mins that go away spontaneously. Occurs about 1x/month.   Echo 06/22/20 EF  50-55%  GLS -13.3 (underestimated) Personally reviewed  Echo 03/15/20 EF 55-60 GLS -13.5 Personally reviewed   Oncology History  Malignant neoplasm of lower-inner quadrant of right breast of female, estrogen receptor positive ([place])  05/25/2019 Initial Diagnosis   Patient palpated a right breast lump for two years. Diagnostic mammogram showed a 4.5cm right breast mass at the 6 o'clock position involving the overlying skin, 2 benign appearing left breast mass at the 10 o'clock position measuring 2.4cm and 2.1cm, and no axillary adenopathy. Biopsy showed: in the left breast, fibroadenoma and no evidence of malignancy, and in the right breast, IDC with DCIS, grade 3.  ER 70%, PR 30%, Ki-67 40%, HER-2 negative by [place]    06/10/2019 - 10/28/2019 Chemotherapy   The patient had dexamethasone (DECADRON) 4 MG tablet, 4 mg (100 % of original dose 4 mg), Oral, Daily, 1 of 1 cycle, Start date: 06/05/2019, End date: 08/12/2019 Dose modification: 4 mg (original dose 4 mg, Cycle 0) DOXOrubicin (ADRIAMYCIN) chemo injection 118 mg, 60 mg/m2 = 118 mg, Intravenous,  Once, 4 of 4 cycles Dose  modification: 50 mg/m2 (original dose 60 mg/m2, Cycle 2, Reason: Dose not tolerated) Administration: 118 mg (06/10/2019), 98 mg (06/24/2019), 98 mg (07/08/2019), 98 mg (07/22/2019) palonosetron (ALOXI) injection 0.25 mg, 0.25 mg, Intravenous,  Once, 4 of 4 cycles Administration: 0.25 mg (06/10/2019), 0.25 mg (06/24/2019), 0.25 mg (07/08/2019), 0.25 mg (07/22/2019) pegfilgrastim-cbqv (UDENYCA) injection 6 mg, 6 mg, Subcutaneous, Once, 4 of 4 cycles Administration: 6 mg (06/12/2019), 6 mg (06/26/2019), 6 mg (07/10/2019), 6 mg (07/24/2019) cyclophosphamide (CYTOXAN) 1,180 mg in sodium chloride 0.9 % 250 mL chemo infusion, 600 mg/m2 = 1,180 mg, Intravenous,  Once, 4 of 4 cycles Dose modification: 500 mg/m2 (original dose 600 mg/m2, Cycle 2, Reason: Dose not tolerated) Administration: 1,180 mg (06/10/2019), 980 mg (06/24/2019), 980 mg (07/08/2019), 980 mg (07/22/2019) PACLitaxel (TAXOL) 156 mg in sodium chloride 0.9 % 250 mL chemo infusion (</= 21m/m2), 80 mg/m2 = 156 mg, Intravenous,  Once, 12 of 12 cycles Dose modification: 65 mg/m2 (original dose 80 mg/m2, Cycle 11, Reason: Provider Judgment), 50 mg/m2 (original dose 80 mg/m2, Cycle 12, Reason: Dose not tolerated) Administration: 156 mg (08/12/2019), 156 mg (08/19/2019), 138 mg (08/26/2019), 138 mg (09/02/2019), 138 mg (09/09/2019), 138 mg (09/16/2019), 114 mg (09/23/2019), 90 mg (09/30/2019), 90 mg (10/07/2019), 90 mg (10/14/2019), 90 mg (10/21/2019), 90 mg (10/28/2019) fosaprepitant (EMEND) 150 mg, dexamethasone (DECADRON) 12 mg in sodium chloride 0.9 % 145 mL IVPB, , Intravenous,  Once, 4 of 4 cycles Administration:  (06/10/2019),  (06/24/2019),  (07/08/2019),  (07/22/2019)  for chemotherapy treatment.     Genetic Testing   No pathogenic variants identified. VUS in [place] c.2378A>G identified on the [facility] Common Hereditary Cancers Panel. The report date is 06/15/2019.  The Common Hereditary Cancers Panel offered by [facility] includes sequencing and/or deletion  duplication testing of the following 48 genes: APC, ATM, AXIN2, BARD1, BMPR1A, BRCA1, BRCA2, BRIP1, CDH1, CDKN2A (p14ARF), CDKN2A (p16INK4a), CKD4, CHEK2, CTNNA1, DICER1, EPCAM (Deletion/duplication testing only), GREM1 (promoter region deletion/duplication testing only), KIT, MEN1, MLH1, MSH2, MSH3, MSH6, MUTYH, NBN, NF1, NHTL1, PALB2, PDGFRA, PMS2, POLD1, POLE, PTEN, RAD50, RAD51C, RAD51D, RNF43, SDHB, SDHC, SDHD, SMAD4, SMARCA4. STK11, TP53, TSC1, TSC2, and VHL.  The following genes were evaluated for sequence changes only: SDHA and HOXB13 c.251G>A variant only.   11/18/2019 Surgery   Right lumpectomy Donne Hazel): IDC, grade 3, 1.5cm, with grade 2-3 DCIS, 2 right axillary lymph nodes negative. FISH on pathology revealed HER-2 positivity.   12/09/2019 -  Chemotherapy   The patient had trastuzumab-hyaluronidase-oysk (HERCEPTIN HYLECTA) 600-10000 MG-UNT/5ML chemo SQ injection 600 mg, 600 mg, Subcutaneous,  Once, 8 of 17 cycles Administration: 600 mg (12/09/2019), 600 mg (12/30/2019), 600 mg (01/20/2020), 600 mg (02/10/2020), 600 mg (03/02/2020), 600 mg (03/23/2020), 600 mg (04/14/2020), 600 mg (05/04/2020)  for chemotherapy treatment.    12/28/2019 - 02/12/2020 Radiation Therapy   Adjuvant radiation   02/2020 -  Anti-estrogen oral therapy   Zoladex monthly until she underwent a BSO in 02/2020; Anastrozole started 02/2020       Review of Systems: [y] = yes, '[ ]'  = no   General: Weight gain '[ ]' ; Weight loss '[ ]' ; Anorexia '[ ]' ; Fatigue '[ ]' ; Fever '[ ]' ; Chills '[ ]' ; Weakness '[ ]'   Cardiac: Chest pain/pressure '[ ]' ; Resting SOB '[ ]' ; Exertional SOB '[ ]' ; Orthopnea '[ ]' ; Pedal Edema '[ ]' ; Palpitations [ y]; Syncope '[ ]' ; Presyncope '[ ]' ; Paroxysmal nocturnal dyspnea'[ ]'   Pulmonary: Cough '[ ]' ; Wheezing'[ ]' ; Hemoptysis'[ ]' ; Sputum '[ ]' ; Snoring '[ ]'   GI: Vomiting'[ ]' ; Dysphagia'[ ]' ; Melena'[ ]' ; Hematochezia '[ ]' ; Heartburn'[ ]' ; Abdominal pain '[ ]' ; Constipation '[ ]' ; Diarrhea '[ ]' ; BRBPR '[ ]'   GU: Hematuria'[ ]' ; Dysuria Blue.Reese ];  Nocturia'[ ]'   Vascular: Pain in legs with walking '[ ]' ; Pain in feet with lying flat '[ ]' ; Non-healing sores '[ ]' ; Stroke '[ ]' ; TIA '[ ]' ; Slurred speech '[ ]' ;  Neuro: Headaches'[ ]' ; Vertigo'[ ]' ; Seizures'[ ]' ; Paresthesias'[ ]' ;Blurred vision '[ ]' ; Diplopia '[ ]' ; Vision changes '[ ]'   Ortho/Skin: Arthritis '[ ]' ; Joint pain '[ ]' ; Muscle pain '[ ]' ; Joint swelling '[ ]' ; Back Pain '[ ]' ; Rash '[ ]'   Psych: Depression'[ ]' ; Anxiety[y ]  Heme: Bleeding problems '[ ]' ; Clotting disorders '[ ]' ; Anemia '[ ]'   Endocrine: Diabetes '[ ]' ; Thyroid dysfunction'[ ]'    Past Medical History:  Diagnosis Date  . Anxiety   . Breast cancer ([place])   . Cancer ([place])    breast  . Depression   . Family history of pancreatic cancer   . Family history of prostate cancer   . GERD (gastroesophageal reflux disease)     Current Outpatient Medications  Medication Sig Dispense Refill  . acetaminophen (TYLENOL) 500 MG tablet Take 1,000 mg by mouth every 6 (six) hours as needed for mild pain or moderate pain.    Marland Kitchen anastrozole (ARIMIDEX) 1 MG tablet Take 1 tablet (1 mg total) by mouth daily. 30 tablet 6  . bimatoprost (LATISSE) 0.03 % ophthalmic solution Place 1 application into both eyes at bedtime. Place one drop on applicator and apply evenly along the skin of the upper eyelid at base of eyelashes once daily at bedtime; repeat procedure for second eye (use a  clean applicator). (Patient taking differently: Place 1 application into both eyes See admin instructions. Place one drop on applicator and apply evenly along the skin of the upper eyelid at base of eyelashes once every other day at bedtime) 5 mL 12  . Calcium Carbonate-Vit D-Min (CALTRATE PLUS PO) Take 1 tablet by mouth daily.    . cephALEXin (KEFLEX) 500 MG capsule Take 1 capsule (500 mg total) by mouth 3 (three) times daily. 21 capsule 0  . Cholecalciferol (VITAMIN D3 PO) Take 1 capsule by mouth daily.    . COLLAGEN PO Take 2 [place] by mouth daily.    . Cyanocobalamin (B-12 PO) Take 1 tablet by  mouth daily.    . diphenhydramine-acetaminophen (TYLENOL PM) 25-500 MG TABS tablet Take 2 tablets by mouth at bedtime as needed (sleep).    . docusate sodium (COLACE) 250 MG capsule Take 250 mg by mouth at bedtime.    Marland Kitchen doxylamine, Sleep, (UNISOM) 25 MG tablet Take 25 mg by mouth at bedtime as needed for sleep.    Marland Kitchen gabapentin (NEURONTIN) 100 MG capsule TAKE 1 CAPSULE BY MOUTH AT BEDTIME. (Patient taking differently: Take 100 mg by mouth at bedtime.) 30 capsule 3  . Multiple Vitamins-Minerals (MULTIVITAMIN WITH MINERALS) tablet Take 1 tablet by mouth daily. [place]    . omeprazole (PRILOSEC) 40 MG capsule Take 1 capsule (40 mg total) by mouth daily. (Patient taking differently: Take 40 mg by mouth daily as needed (acid reflux).) 90 capsule 3  . ondansetron (ZOFRAN ODT) 4 MG disintegrating tablet Take 1 tablet (4 mg total) by mouth every 8 (eight) hours as needed for nausea or vomiting. 30 tablet 0  . oxyCODONE-acetaminophen (PERCOCET/ROXICET) 5-325 MG tablet Take 1 tablet by mouth every 4 (four) hours as needed for severe pain. 30 tablet 0  . polyethylene glycol (MIRALAX / GLYCOLAX) 17 g packet Take 17 g by mouth daily.    . tamsulosin (FLOMAX) 0.4 MG CAPS capsule Take 1 capsule (0.4 mg total) by mouth daily. 14 capsule 0  . venlafaxine XR (EFFEXOR-XR) 37.5 MG 24 hr capsule TAKE 1 CAPSULE BY MOUTH DAILY WITH BREAKFAST. (Patient taking differently: Take 37.5 mg by mouth daily with breakfast.) 90 capsule 2  . zolpidem (AMBIEN) 10 MG tablet Take 1 tablet (10 mg total) by mouth at bedtime as needed for sleep. (Patient taking differently: Take 5 mg by mouth at bedtime.) 30 tablet 3   No current facility-administered medications for this encounter.    Allergies  Allergen Reactions  . Adhesive [Tape] Itching and Rash      Social History   Socioeconomic History  . Marital status: Married    Spouse name: Not on file  . Number of children: Not on file  . Years of education: Not on file  .  Highest education level: Not on file  Occupational History  . Not on file  Tobacco Use  . Smoking status: Former Research scientist (life sciences)  . Smokeless tobacco: Never Used  Vaping Use  . Vaping Use: Every day  Substance and Sexual Activity  . Alcohol use: Yes    Comment: occas.  . Drug use: Never  . Sexual activity: Not on file  Other Topics Concern  . Not on file  Social History Narrative  . Not on file   Social Determinants of Health   Financial Resource Strain: Not on file  Food Insecurity: Not on file  Transportation Needs: Not on file  Physical Activity: Not on file  Stress: Not on file  Social Connections: Not on file  Intimate Partner Violence: Not on file      Family History  Problem Relation Age of Onset  . Prostate cancer Father   . Pancreatic cancer Paternal Grandmother   PGF - died of MI in 19s  Vitals:   07-19-20 1128  BP: 110/70  Pulse: 88  SpO2: 99%  Weight: 50 kg (110 lb 3.2 oz)    PHYSICAL EXAM: General:  Well appearing. No respiratory difficulty HEENT: normal Neck: supple. no JVD. Carotids 2+ bilat; no bruits. No lymphadenopathy or thryomegaly appreciated. Cor: PMI nondisplaced. Regular rate & rhythm. No rubs, gallops or murmurs. Lungs: clear Abdomen: soft, nontender, nondistended. No hepatosplenomegaly. No bruits or masses. Good bowel sounds. Extremities: no cyanosis, clubbing, rash, edema Neuro: alert & oriented x 3, cranial nerves grossly intact. moves all 4 extremities w/o difficulty. Affect pleasant.   ASSESSMENT & PLAN: 1. Right Breast Cancer diagnosed 11/20 -  She has completedneoadjuvant chemotherapy with adriamycin based regimen starting in 12/20 (initial path HER-2 negative),lumpectomy, radiation.  Lumpectomy pathology revealed HER-2 positivity and is currently on Herceptin  and antiestrogen therapy with anastrozole starting in 6/21 - Explained incidence of Herceptin cardiotoxicity (~30% in setting of previous adriamycin exposure)and role of  Cardio-oncology clinic at length. Echo images reviewed personally. All parameters stable. Reviewed signs and symptoms of HF to look for. Continue Herceptin. Follow-up with echo in 3 months. - Echo today 2020-07-19 EF slightly down from previous 50-55% unclear if very mild Hercepti cardiotoxicity or just slight variation. Repeat echo after next 2 rounds of Herceptin  2. Palpitations - place Zio   Glori Bickers, MD  11:59 AM

## 2020-06-22 NOTE — Progress Notes (Signed)
  [facility] Perioperative Services: Pre-Admission/Anesthesia Testing     Date: 06/22/20  Name: Kimberly Mueller MRN:   [id number]  Re: Plans for event monitor (Zio patch) during upcoming urological procedure   Case: [id number] Date/Time: 06/24/20 1318   Procedure: CYSTOSCOPY/URETEROSCOPY/HOLMIUM LASER/STENT PLACEMENT (Right )   Anesthesia type: Choice   Pre-op diagnosis: Nephrolithiasis   Location: [facility] OR ROOM 10 / [place] [facility] FOR ANESTHESIA GROUP   Surgeons: Billey Co, MD    Patient is scheduled for the above procedure on 06/24/2020 with Dr. Nickolas Madrid.  Contacted by attending surgeons office today (06/22/2020) to inquire about event monitor study that is currently being conducted. Office staff with questions as to whether or not patient will be able to keep the event monitor in place intraoperatively.  EMR reviewed.  Patient was seen in consult by cardio-oncology program physician Haroldine Laws, MD) today.  Patient continues on maintenance trastuzumab and aromatase inhibitor (anastrozole) for treatment of her breast cancer.  At the time of visit, patient reporting occasional episodes of palpitations lasting 10 to 15 minutes at a time they go away spontaneously.  Episodes occuring approximately once per month.     TTE from 03/15/2020 revealed a normal left ventricular systolic function; LVEF 55 to 60%.  Left ventricular GLS documented as -13.8%.     Given her symptoms, the decision was made to repeat patient's TTE and performed an event monitor study.     TTE performed today (06/22/2020 revealed a very subtle septal hypokinesis, a low normal left ventricular systolic function (LVEF 93-81%), and an average left ventricular GLS of -13.3%.   14-day Zio patch placed.  Spoke with [facility] OR Systems analyst (Milly Jakob, Therapist, sports).  Discussed the aforementioned details regarding patient's case.  RN made aware that patient currently has Zio patch in place x 14 days.  Information  was discussed by with department director Redmond Pulling, RN) who advised that with the procedure that the patient is having, there should be no contraindications with leaving the event monitor in place. Will advise urology practice.   Honor Loh, MSN, APRN, FNP-C, CEN [facility]  Peri-operative Services Nurse Practitioner Phone: [phone] 06/22/20 4:45 PM

## 2020-06-22 NOTE — Patient Instructions (Signed)
Your provider has recommended that  you wear a Zio Patch for 14 days.  This monitor will record your heart rhythm for our review.  IF you have any symptoms while wearing the monitor please press the button.  If you have any issues with the patch or you notice a red or orange light on it please call the company at [phone].  Once you remove the patch please mail it back to the company as soon as possible so we can get the results.  Your physician recommends that you schedule a follow-up appointment in: 7 weeks with echocardiogram  If you have any questions or concerns before your next appointment please send [place] a message through [place] or call our office at [phone].    TO LEAVE A MESSAGE FOR THE NURSE SELECT OPTION 2, PLEASE LEAVE A MESSAGE INCLUDING: . YOUR NAME . DATE OF BIRTH . CALL BACK NUMBER . REASON FOR CALL**this is important as we prioritize the call backs  [place] AS LONG AS YOU CALL BEFORE 4:00 PM  At the [facility], you and your health needs are our priority. As part of our continuing mission to provide you with exceptional heart care, we have created designated Provider Care Teams. These Care Teams include your primary Cardiologist (physician) and Advanced Practice Providers (APPs- Physician Assistants and Nurse Practitioners) who all work together to provide you with the care you need, when you need it.   You may see any of the following providers on your designated Care Team at your next follow up: Marland Kitchen Dr Glori Bickers . Dr Loralie Champagne . Darrick Grinder, NP . Lyda Jester, PA . Audry Riles, PharmD   Please be sure to bring in all your medications bottles to every appointment.

## 2020-06-22 NOTE — Progress Notes (Signed)
Late Entry from 12:30 pm 06/22/20: Zio patch placed onto patient.  All instructions and information reviewed with patient, they verbalize understanding with no questions.

## 2020-06-23 ENCOUNTER — Other Ambulatory Visit: Payer: Self-pay

## 2020-06-23 ENCOUNTER — Encounter
Admission: RE | Admit: 2020-06-23 | Discharge: 2020-06-23 | Disposition: A | Discharge status: Home / Self Care | Payer: No Typology Code available for payment source | Source: Ambulatory Visit | Attending: Urology | Admitting: Urology

## 2020-06-23 HISTORY — DX: Personal history of urinary calculi: Z87.442

## 2020-06-23 NOTE — Patient Instructions (Signed)
Your procedure is scheduled on: 06/24/20 Report to [facility] AFTER CHECKING IN AT [facility]. To find out your arrival time please call [phone] between 1PM - 3PM on 06/23/20. The secretary will call you between 1-3 pm.  Remember: Instructions that are not followed completely may result in serious medical risk, up to and including death, or upon the discretion of your surgeon and anesthesiologist your surgery may need to be rescheduled.     _X__ 1. Do not eat food after midnight the night before your procedure.                 No gum chewing or hard candies. You may drink clear liquids up to 2 hours                 before you are scheduled to arrive for your surgery- DO not drink clear                 liquids within 2 hours of the start of your surgery.                 Clear Liquids include:  water, apple juice without pulp, clear carbohydrate                 drink such as Clearfast or Gatorade, Black Coffee or Tea (Do not add                 anything to coffee or tea). Diabetics water only  __X__2.  On the morning of surgery brush your teeth with toothpaste and water, you                 may rinse your mouth with mouthwash if you wish.  Do not swallow any              toothpaste of mouthwash.     _X__ 3.  No Alcohol for 24 hours before or after surgery.   _X__ 4.  Do Not Smoke or use e-cigarettes For 24 Hours Prior to Your Surgery.                 Do not use any chewable tobacco products for at least 6 hours prior to                 surgery.  ____  5.  Bring all medications with you on the day of surgery if instructed.   __X__  6.  Notify your doctor if there is any change in your medical condition      (cold, fever, infections).     Do not wear jewelry, make-up, hairpins, clips or nail polish. Do not wear lotions, powders, or perfumes.  Do not shave 48 hours prior to surgery. Men may shave face and neck. Do not  bring valuables to the hospital.    [facility] is not responsible for any belongings or valuables.  Contacts, dentures/partials or body piercings may not be worn into surgery. Bring a case for your contacts, glasses or hearing aids, a denture cup will be supplied. Leave your suitcase in the car. After surgery it may be brought to your room. For patients admitted to the hospital, discharge time is determined by your treatment team.   Patients discharged the day of surgery will not be allowed to drive home.   Please read over the following fact sheets that you were given:   MRSA Information  __X__ Take these medicines the morning of surgery with A SIP OF WATER:    1. anastrozole (ARIMIDEX) 1 MG tablet  2. omeprazole (PRILOSEC) 40 MG capsule  3. tamsulosin (FLOMAX) 0.4 MG CAPS capsule  4. venlafaxine XR (EFFEXOR-XR) 37.5 MG 24 hr capsule  5.  6.  ____ Fleet Enema (as directed)   ____ Use CHG Soap/SAGE wipes as directed  ____ Use inhalers on the day of surgery  ____ Stop metformin/Janumet/Farxiga 2 days prior to surgery    ____ Take 1/2 of usual insulin dose the night before surgery. No insulin the morning          of surgery.   ____ Stop Blood Thinners Coumadin/Plavix/Xarelto/Pleta/Pradaxa/Eliquis/Effient/Aspirin  on   Or contact your Surgeon, Cardiologist or Medical Doctor regarding  ability to stop your blood thinners  __X__ Stop Anti-inflammatories 7 days before surgery such as Advil, Ibuprofen, Motrin,  BC or Goodies Powder, Naprosyn, Naproxen, Aleve, Aspirin    __X__ Stop all herbal supplements, fish oil or vitamin E until after surgery.    ____ Bring C-Pap to the hospital.

## 2020-06-24 ENCOUNTER — Encounter
Admission: RE | Disposition: A | Discharge status: Home / Self Care | Payer: Self-pay | Source: Home / Self Care | Attending: Urology

## 2020-06-24 ENCOUNTER — Ambulatory Visit: Payer: No Typology Code available for payment source

## 2020-06-24 ENCOUNTER — Ambulatory Visit: Payer: No Typology Code available for payment source | Admitting: Urgent Care

## 2020-06-24 ENCOUNTER — Ambulatory Visit
Admission: RE | Admit: 2020-06-24 | Discharge: 2020-06-24 | Disposition: A | Discharge status: Home / Self Care | Payer: No Typology Code available for payment source | Attending: Urology | Admitting: Urology

## 2020-06-24 ENCOUNTER — Encounter: Payer: Self-pay | Admitting: Urology

## 2020-06-24 DIAGNOSIS — Z79899 Other long term (current) drug therapy: Secondary | ICD-10-CM | POA: Diagnosis not present

## 2020-06-24 DIAGNOSIS — N132 Hydronephrosis with renal and ureteral calculous obstruction: Secondary | ICD-10-CM | POA: Diagnosis present

## 2020-06-24 DIAGNOSIS — Z79811 Long term (current) use of aromatase inhibitors: Secondary | ICD-10-CM | POA: Diagnosis not present

## 2020-06-24 DIAGNOSIS — N201 Calculus of ureter: Secondary | ICD-10-CM

## 2020-06-24 DIAGNOSIS — Z9884 Bariatric surgery status: Secondary | ICD-10-CM | POA: Insufficient documentation

## 2020-06-24 DIAGNOSIS — N200 Calculus of kidney: Secondary | ICD-10-CM

## 2020-06-24 DIAGNOSIS — Z87891 Personal history of nicotine dependence: Secondary | ICD-10-CM | POA: Diagnosis not present

## 2020-06-24 DIAGNOSIS — C50911 Malignant neoplasm of unspecified site of right female breast: Secondary | ICD-10-CM | POA: Insufficient documentation

## 2020-06-24 HISTORY — PX: CYSTOSCOPY/RETROGRADE/URETEROSCOPY: SHX5316

## 2020-06-24 SURGERY — CYSTOSCOPY/RETROGRADE/URETEROSCOPY
Anesthesia: General | Laterality: Right

## 2020-06-24 MED ORDER — DEXAMETHASONE SODIUM PHOSPHATE 10 MG/ML IJ SOLN
INTRAMUSCULAR | Status: DC | PRN
  Administered 2020-06-24: 5 mg via INTRAVENOUS

## 2020-06-24 MED ORDER — KETOROLAC TROMETHAMINE 15 MG/ML IJ SOLN
INTRAMUSCULAR | Status: DC | PRN
  Administered 2020-06-24: 15 mg via INTRAVENOUS

## 2020-06-24 MED ORDER — PHENYLEPHRINE HCL (PRESSORS) 10 MG/ML IV SOLN
INTRAVENOUS | Status: DC | PRN
  Administered 2020-06-24 (×2): 100 ug via INTRAVENOUS

## 2020-06-24 MED ORDER — DEXAMETHASONE SODIUM PHOSPHATE 10 MG/ML IJ SOLN
INTRAMUSCULAR | Status: AC
  Filled 2020-06-24: qty 1

## 2020-06-24 MED ORDER — FENTANYL CITRATE (PF) 250 MCG/5ML IJ SOLN
INTRAMUSCULAR | Status: DC | PRN
  Administered 2020-06-24 (×3): 25 ug via INTRAVENOUS

## 2020-06-24 MED ORDER — ROCURONIUM BROMIDE 10 MG/ML (PF) SYRINGE
PREFILLED_SYRINGE | INTRAVENOUS | Status: AC
  Filled 2020-06-24: qty 10

## 2020-06-24 MED ORDER — MIDAZOLAM HCL 2 MG/2ML IJ SOLN
INTRAMUSCULAR | Status: DC | PRN
  Administered 2020-06-24: 2 mg via INTRAVENOUS

## 2020-06-24 MED ORDER — PROPOFOL 10 MG/ML IV BOLUS
INTRAVENOUS | Status: DC | PRN
  Administered 2020-06-24: 90 mg via INTRAVENOUS

## 2020-06-24 MED ORDER — OXYCODONE HCL 5 MG PO TABS
5.0000 mg | ORAL_TABLET | Freq: Once | ORAL | Status: AC
  Administered 2020-06-24: 5 mg via ORAL

## 2020-06-24 MED ORDER — ONDANSETRON HCL 4 MG/2ML IJ SOLN
INTRAMUSCULAR | Status: AC
  Filled 2020-06-24: qty 2

## 2020-06-24 MED ORDER — FENTANYL CITRATE (PF) 100 MCG/2ML IJ SOLN
INTRAMUSCULAR | Status: AC
  Filled 2020-06-24: qty 2

## 2020-06-24 MED ORDER — PROPOFOL 10 MG/ML IV BOLUS
INTRAVENOUS | Status: AC
  Filled 2020-06-24: qty 20

## 2020-06-24 MED ORDER — CEFAZOLIN SODIUM-DEXTROSE 2-4 GM/100ML-% IV SOLN
2.0000 g | INTRAVENOUS | Status: AC
  Administered 2020-06-24: 2 g via INTRAVENOUS

## 2020-06-24 MED ORDER — CHLORHEXIDINE GLUCONATE 0.12 % MT SOLN
OROMUCOSAL | Status: AC
  Administered 2020-06-24: 15 mL via OROMUCOSAL
  Filled 2020-06-24: qty 15

## 2020-06-24 MED ORDER — LIDOCAINE HCL (CARDIAC) PF 100 MG/5ML IV SOSY
PREFILLED_SYRINGE | INTRAVENOUS | Status: DC | PRN
  Administered 2020-06-24: 40 mg via INTRAVENOUS

## 2020-06-24 MED ORDER — ONDANSETRON HCL 4 MG/2ML IJ SOLN
INTRAMUSCULAR | Status: DC | PRN
  Administered 2020-06-24: 4 mg via INTRAVENOUS

## 2020-06-24 MED ORDER — MIDAZOLAM HCL 2 MG/2ML IJ SOLN
INTRAMUSCULAR | Status: AC
  Filled 2020-06-24: qty 2

## 2020-06-24 MED ORDER — CHLORHEXIDINE GLUCONATE 0.12 % MT SOLN: 15.0000 mL | Freq: Once | OROMUCOSAL | Status: AC

## 2020-06-24 MED ORDER — SUGAMMADEX SODIUM 200 MG/2ML IV SOLN
INTRAVENOUS | Status: DC | PRN
  Administered 2020-06-24: 200 mg via INTRAVENOUS

## 2020-06-24 MED ORDER — ROCURONIUM BROMIDE 100 MG/10ML IV SOLN
INTRAVENOUS | Status: DC | PRN
  Administered 2020-06-24: 50 mg via INTRAVENOUS

## 2020-06-24 MED ORDER — KETOROLAC TROMETHAMINE 30 MG/ML IJ SOLN
INTRAMUSCULAR | Status: AC
  Filled 2020-06-24: qty 1

## 2020-06-24 MED ORDER — LIDOCAINE HCL (PF) 2 % IJ SOLN
INTRAMUSCULAR | Status: AC
  Filled 2020-06-24: qty 5

## 2020-06-24 MED ORDER — IOHEXOL 180 MG/ML  SOLN
INTRAMUSCULAR | Status: DC | PRN
  Administered 2020-06-24: 30 mL

## 2020-06-24 MED ORDER — OXYCODONE HCL 5 MG PO TABS
ORAL_TABLET | ORAL | Status: AC
  Filled 2020-06-24: qty 1

## 2020-06-24 MED ORDER — LACTATED RINGERS IV SOLN: INTRAVENOUS | Status: DC

## 2020-06-24 MED ORDER — ORAL CARE MOUTH RINSE: 15.0000 mL | Freq: Once | OROMUCOSAL | Status: AC

## 2020-06-24 SURGICAL SUPPLY — 32 items
BAG DRAIN CYSTO-URO LG1000N (MISCELLANEOUS) ×3 IMPLANT
BRUSH SCRUB EZ 1% IODOPHOR (MISCELLANEOUS) ×3 IMPLANT
CATH URETL 5X70 OPEN END (CATHETERS) IMPLANT
CNTNR SPEC 2.5X3XGRAD LEK (MISCELLANEOUS)
CONT SPEC 4OZ STER OR WHT (MISCELLANEOUS)
CONT SPEC 4OZ STRL OR WHT (MISCELLANEOUS)
CONTAINER SPEC 2.5X3XGRAD LEK (MISCELLANEOUS) IMPLANT
DRAPE UTILITY 15X26 TOWEL STRL (DRAPES) ×3 IMPLANT
FIBER LASER TRAC TIP (UROLOGICAL SUPPLIES) IMPLANT
GLOVE BIOGEL PI IND STRL 7.5 (GLOVE) ×2 IMPLANT
GLOVE BIOGEL PI INDICATOR 7.5 (GLOVE) ×1
GOWN STRL REUS W/ TWL LRG LVL3 (GOWN DISPOSABLE) ×2 IMPLANT
GOWN STRL REUS W/ TWL XL LVL3 (GOWN DISPOSABLE) ×2 IMPLANT
GOWN STRL REUS W/TWL LRG LVL3 (GOWN DISPOSABLE) ×3
GOWN STRL REUS W/TWL XL LVL3 (GOWN DISPOSABLE) ×3
GUIDEWIRE STR DUAL SENSOR (WIRE) ×3 IMPLANT
INFUSOR MANOMETER BAG 3000ML (MISCELLANEOUS) ×3 IMPLANT
INTRODUCER DILATOR DOUBLE (INTRODUCER) IMPLANT
KIT TURNOVER CYSTO (KITS) ×3 IMPLANT
MANIFOLD NEPTUNE II (INSTRUMENTS) ×3 IMPLANT
PACK CYSTO AR (MISCELLANEOUS) ×3 IMPLANT
SET CYSTO W/LG BORE CLAMP LF (SET/KITS/TRAYS/PACK) ×3 IMPLANT
SHEATH URETERAL 12FRX35CM (MISCELLANEOUS) IMPLANT
SOL .9 NS 3000ML IRR  AL (IV SOLUTION) ×1
SOL .9 NS 3000ML IRR AL (IV SOLUTION) ×2
SOL .9 NS 3000ML IRR UROMATIC (IV SOLUTION) ×2 IMPLANT
STENT URET 6FRX24 CONTOUR (STENTS) IMPLANT
STENT URET 6FRX26 CONTOUR (STENTS) IMPLANT
SURGILUBE 2OZ TUBE FLIPTOP (MISCELLANEOUS) ×3 IMPLANT
SYR 10ML LL (SYRINGE) ×3 IMPLANT
VALVE UROSEAL ADJ ENDO (VALVE) IMPLANT
WATER STERILE IRR 1000ML POUR (IV SOLUTION) ×3 IMPLANT

## 2020-06-24 NOTE — Interval H&P Note (Signed)
UROLOGY H&P UPDATE  Agree with prior H&P dated 06/21/20.  Cardiac: RRR Lungs: CTA bilaterally  Laterality: RIGHT Procedure: right ureteroscopy, laser lithotripsy, stent placement  Urine: Culture 12/10 with 20K Streptococcus, suspect contaminant  We specifically discussed the risks ureteroscopy including bleeding, infection/sepsis, stent related symptoms including flank pain/urgency/frequency/incontinence/dysuria, ureteral injury, inability to access stone, or need for staged or additional procedures.   Billey Co, MD 06/24/2020

## 2020-06-24 NOTE — Transfer of Care (Signed)
Immediate Anesthesia Transfer of Care Note  Patient: Kimberly Mueller  Procedure(s) Performed: Procedure(s): CYSTOSCOPY/RETROGRADE/URETEROSCOPY  Patient Location: PACU  Anesthesia Type:General  Level of Consciousness: sedated  Airway & Oxygen Therapy: Patient Spontanous Breathing and Patient connected to face mask oxygen  Post-op Assessment: Report given to RN and Post -op Vital signs reviewed and stable  Post vital signs: Reviewed and stable  Last Vitals:  Vitals:   06/24/20 1122 06/24/20 1457  BP: 110/83 114/74  Pulse: (!) 112 72  Resp: 18 12  Temp: (!) 36.1 C 36.5 C  SpO2: 034% 917%    Complications: No apparent anesthesia complications

## 2020-06-24 NOTE — Discharge Instructions (Signed)

## 2020-06-24 NOTE — Anesthesia Preprocedure Evaluation (Signed)
Anesthesia Evaluation  Patient identified by MRN, date of birth, ID band Patient awake    Reviewed: Allergy & Precautions, NPO status , Patient's Chart, lab work & pertinent test results  History of Anesthesia Complications Negative for: history of anesthetic complications  Airway Mallampati: I  TM Distance: >3 FB Neck ROM: Full    Dental no notable dental hx. (+) Teeth Intact   Pulmonary neg pulmonary ROS, neg sleep apnea, neg COPD, Patient abstained from smoking.Not current smoker, former smoker,    Pulmonary exam normal breath sounds clear to auscultation       Cardiovascular Exercise Tolerance: Good METS(-) hypertension(-) CAD and (-) Past MI negative cardio ROS  (-) dysrhythmias  Rhythm:Regular Rate:Normal - Systolic murmurs    Neuro/Psych PSYCHIATRIC DISORDERS Anxiety Depression negative neurological ROS     GI/Hepatic Neg liver ROS, GERD  Medicated,S/p gastric bypass   Endo/Other  negative endocrine ROSneg diabetes  Renal/GU negative Renal ROS     Musculoskeletal   Abdominal   Peds  Hematology negative hematology ROS (+)   Anesthesia Other Findings Past Medical History: No date: Anxiety No date: Breast cancer ([place]) No date: Cancer [facility])     Comment:  breast No date: Depression No date: Family history of pancreatic cancer No date: Family history of prostate cancer No date: GERD (gastroesophageal reflux disease) No date: History of kidney stones  Reproductive/Obstetrics                              Lab Results  Component Value Date   WBC 3.6 (L) 06/18/2020   HGB 12.2 06/18/2020   HCT 35.2 (L) 06/18/2020   MCV 90.3 06/18/2020   PLT 207 06/18/2020   Lab Results  Component Value Date   CREATININE 0.45 06/18/2020   BUN 20 06/18/2020   NA 137 06/18/2020   K 4.1 06/18/2020   CL 103 06/18/2020   CO2 26 06/18/2020    Anesthesia Physical  Anesthesia Plan  ASA:  III  Anesthesia Plan: General   Post-op Pain Management:    Induction: Intravenous  PONV Risk Score and Plan: 4 or greater and Midazolam, Dexamethasone, Ondansetron and Treatment may vary due to age or medical condition  Airway Management Planned: Oral ETT  Additional Equipment: None  Intra-op Plan:   Post-operative Plan: Extubation in OR  Informed Consent: I have reviewed the patients History and Physical, chart, labs and discussed the procedure including the risks, benefits and alternatives for the proposed anesthesia with the patient or authorized representative who has indicated his/her understanding and acceptance.     Dental advisory given  Plan Discussed with: CRNA  Anesthesia Plan Comments: (Discussed risks of anesthesia with patient, including PONV, sore throat, lip/dental damage. Rare risks discussed as well, such as cardiorespiratory and neurological sequelae. Patient understands.)        Anesthesia Quick Evaluation

## 2020-06-24 NOTE — Anesthesia Postprocedure Evaluation (Signed)
Anesthesia Post Note  Patient: Kimberly Mueller  Procedure(s) Performed: CYSTOSCOPY/RETROGRADE/URETEROSCOPY  Patient location during evaluation: PACU Anesthesia Type: General Level of consciousness: awake and alert Pain management: pain level controlled Vital Signs Assessment: post-procedure vital signs reviewed and stable Respiratory status: spontaneous breathing, nonlabored ventilation, respiratory function stable and patient connected to nasal cannula oxygen Cardiovascular status: blood pressure returned to baseline and stable Postop Assessment: no apparent nausea or vomiting Anesthetic complications: no   No complications documented.   Last Vitals:  Vitals:   06/24/20 1527 06/24/20 1541  BP: 111/78 122/83  Pulse: 78 75  Resp: 17 16  Temp:  36.8 C  SpO2: 100% 99%    Last Pain:  Vitals:   06/24/20 1541  TempSrc: Temporal  PainSc: 2                  Arita Miss

## 2020-06-24 NOTE — Op Note (Signed)
Date of procedure: 06/24/20  Preoperative diagnosis:  1. Right distal ureteral stone  Postoperative diagnosis:  1. Same, stone had passed spontaneously  Procedure: 1. Cystoscopy, right retrograde pyelogram with intraoperative interpretation, right diagnostic ureteroscopy  Surgeon: Nickolas Madrid, MD  Anesthesia: General  Complications: None  Intraoperative findings:  1. Normal cystoscopy, ureteral orifices orthotopic bilaterally 2. Right ureter was widely patent with no stones seen all the way up to the proximal ureter 3. Contrast drained briskly from the right kidney and no stent placed  EBL: Minimal  Specimens: None  Drains: None  Indication: Kimberly Mueller is a 34 y.o. patient with gross hematuria who presented to the ED and CT suggested a 7 mm right distal ureteral stone.  After reviewing the management options for treatment, they elected to proceed with the above surgical procedure(s). We have discussed the potential benefits and risks of the procedure, side effects of the proposed treatment, the likelihood of the patient achieving the goals of the procedure, and any potential problems that might occur during the procedure or recuperation. Informed consent has been obtained.  Description of procedure:  The patient was taken to the operating room and general anesthesia was induced. SCDs were placed for DVT prophylaxis. The patient was placed in the dorsal lithotomy position, prepped and draped in the usual sterile fashion, and preoperative antibiotics(Ancef) were administered. A preoperative time-out was performed.   A 21 French rigid cystoscope was used to intubate the urethra and thorough cystoscopy was performed. The bladder was grossly normal. The ureteral orifices were orthotopic bilaterally. A sensor wire was advanced into the right ureteral orifice and advanced easily up to the kidney. A semirigid ureteroscope advanced easily alongside the wire and I was able to navigate  all the way up to the proximal ureter. There were no ureteral abnormalities, no stone visualized. A retrograde pyelogram was performed from the proximal ureter and showed no extravasation or filling defects. Careful pullback ureteroscopy again showed no suspicious findings or stones.  I reentered the bladder with the rigid cystoscope, and there is brisk drainage of contrast from the right kidney and I opted not to place a stent.  Disposition: Stable to PACU  Plan: Follow-up with urology as needed, suspect spontaneously passed right distal ureteral stone  Nickolas Madrid, MD

## 2020-06-24 NOTE — Anesthesia Procedure Notes (Addendum)
Procedure Name: Intubation Date/Time: 06/24/2020 2:18 PM Performed by: Eben Burow, CRNA Pre-anesthesia Checklist: Patient identified, Emergency Drugs available, Suction available and Patient being monitored Patient Re-evaluated:Patient Re-evaluated prior to induction Oxygen Delivery Method: Circle system utilized Preoxygenation: Pre-oxygenation with 100% oxygen Induction Type: IV induction Ventilation: Mask ventilation without difficulty Laryngoscope Size: McGraph and 3 Grade View: Grade I Tube type: Oral Tube size: 6.5 mm Number of attempts: 1 Airway Equipment and Method: Stylet and Video-laryngoscopy Placement Confirmation: ETT inserted through vocal cords under direct vision,  positive ETCO2 and breath sounds checked- equal and bilateral Secured at: 20 cm Tube secured with: Tape Dental Injury: Teeth and Oropharynx as per pre-operative assessment

## 2020-06-25 ENCOUNTER — Encounter: Payer: Self-pay | Admitting: Urology

## 2020-07-05 NOTE — Assessment & Plan Note (Addendum)
05/15/2019:Patient palpated a right breast lump for two years. Diagnostic mammogram showed a 4.5cm right breast mass at the 6 o'clock position involving the overlying skin, 2 benign appearing left breast mass at the 10 o'clock position measuring 2.4cm and 2.1cm, and no axillary adenopathy. 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 Maintenance Herceptin subcutaneously every three weeks x 1 year starting 12/09/2019 3.Adjuvant radiation beginning 12/31/2019 4.Follow-up adjuvant antiestrogen therapyBSO scheduled 7/31, currently on Zoladex, to start Anastrozole mid 02/2020 ----------------------------------------------------------------------------------------------------------------------------------------------- 03/08/2020: Bilateral salpingo-oophorectomy  Herceptin: Will be completed May 2022 Echo on 06/22/2020 EF 50-55%--followed by Dr. Haroldine Laws, to be repeated in 6 weeks Weight loss: Prior gastric bypass surgery, stable Current treatment: Anastrozole and Herceptin  Anastrozole toxicities: 1.  Hot flashes: Improved with Effexor, increased effexor to 43m per day 2. fatigue: Slowly improving with time.  Insomnia issues Changed to valium by Dr. GLindi Adie Breast cancer surveillance: Mammograms in 06/2020 normal, breast density D.  MRI recommended and orders placed    We will see her back with every other Herceptin.

## 2020-07-05 NOTE — Progress Notes (Signed)
[facility] Follow up:    Kimberly Panda, MD [address] [place] [address]   DIAGNOSIS: Cancer Staging Malignant neoplasm of lower-inner quadrant of right breast of female, estrogen receptor positive ([place]) Staging form: Breast, AJCC 8th Edition - Clinical stage from 05/27/2019: Stage IIA (cT2, cN0, cM0, G3, ER+, PR+, HER2: Equivocal) - Unsigned - Pathologic stage from 11/18/2019: No Stage Recommended (ypT1c, pN0, cM0, G3, ER+, PR+, HER2+) - Unsigned   SUMMARY OF ONCOLOGIC HISTORY: Oncology History  Malignant neoplasm of lower-inner quadrant of right breast of female, estrogen receptor positive ([place])  05/25/2019 Initial Diagnosis   Patient palpated a right breast lump for two years. Diagnostic mammogram showed a 4.5cm right breast mass at the 6 o'clock position involving the overlying skin, 2 benign appearing left breast mass at the 10 o'clock position measuring 2.4cm and 2.1cm, and no axillary adenopathy. Biopsy showed: in the left breast, fibroadenoma and no evidence of malignancy, and in the right breast, IDC with DCIS, grade 3.  ER 70%, PR 30%, Ki-67 40%, HER-2 negative by [facility]    06/10/2019 - 10/28/2019 Chemotherapy   The patient had dexamethasone (DECADRON) 4 MG tablet, 4 mg (100 % of original dose 4 mg), Oral, Daily, 1 of 1 cycle, Start date: 06/05/2019, End date: 08/12/2019 Dose modification: 4 mg (original dose 4 mg, Cycle 0) DOXOrubicin (ADRIAMYCIN) chemo injection 118 mg, 60 mg/m2 = 118 mg, Intravenous,  Once, 4 of 4 cycles Dose modification: 50 mg/m2 (original dose 60 mg/m2, Cycle 2, Reason: Dose not tolerated) Administration: 118 mg (06/10/2019), 98 mg (06/24/2019), 98 mg (07/08/2019), 98 mg (07/22/2019) palonosetron (ALOXI) injection 0.25 mg, 0.25 mg, Intravenous,  Once, 4 of 4 cycles Administration: 0.25 mg (06/10/2019), 0.25 mg (06/24/2019), 0.25 mg (07/08/2019), 0.25 mg (07/22/2019) pegfilgrastim-cbqv (UDENYCA) injection 6 mg, 6 mg, Subcutaneous, Once, 4  of 4 cycles Administration: 6 mg (06/12/2019), 6 mg (06/26/2019), 6 mg (07/10/2019), 6 mg (07/24/2019) cyclophosphamide (CYTOXAN) 1,180 mg in sodium chloride 0.9 % 250 mL chemo infusion, 600 mg/m2 = 1,180 mg, Intravenous,  Once, 4 of 4 cycles Dose modification: 500 mg/m2 (original dose 600 mg/m2, Cycle 2, Reason: Dose not tolerated) Administration: 1,180 mg (06/10/2019), 980 mg (06/24/2019), 980 mg (07/08/2019), 980 mg (07/22/2019) PACLitaxel (TAXOL) 156 mg in sodium chloride 0.9 % 250 mL chemo infusion (</= 59m/m2), 80 mg/m2 = 156 mg, Intravenous,  Once, 12 of 12 cycles Dose modification: 65 mg/m2 (original dose 80 mg/m2, Cycle 11, Reason: Provider Judgment), 50 mg/m2 (original dose 80 mg/m2, Cycle 12, Reason: Dose not tolerated) Administration: 156 mg (08/12/2019), 156 mg (08/19/2019), 138 mg (08/26/2019), 138 mg (09/02/2019), 138 mg (09/09/2019), 138 mg (09/16/2019), 114 mg (09/23/2019), 90 mg (09/30/2019), 90 mg (10/07/2019), 90 mg (10/14/2019), 90 mg (10/21/2019), 90 mg (10/28/2019) fosaprepitant (EMEND) 150 mg, dexamethasone (DECADRON) 12 mg in sodium chloride 0.9 % 145 mL IVPB, , Intravenous,  Once, 4 of 4 cycles Administration:  (06/10/2019),  (06/24/2019),  (07/08/2019),  (07/22/2019)  for chemotherapy treatment.     Genetic Testing   No pathogenic variants identified. VUS in [place] c.[id number] identified on the [facility] Common Hereditary Cancers Panel. The report date is 06/15/2019.  The Common Hereditary Cancers Panel offered by [facility] includes sequencing and/or deletion duplication testing of the following 48 genes: APC, ATM, AXIN2, BARD1, BMPR1A, BRCA1, BRCA2, BRIP1, CDH1, CDKN2A (p14ARF), CDKN2A (p16INK4a), CKD4, CHEK2, CTNNA1, DICER1, EPCAM (Deletion/duplication testing only), GREM1 (promoter region deletion/duplication testing only), KIT, MEN1, MLH1, MSH2, MSH3, MSH6, MUTYH, NBN, NF1, NHTL1, PALB2, PDGFRA, PMS2,  POLD1, POLE, PTEN, RAD50, RAD51C, RAD51D, RNF43, SDHB, SDHC, SDHD, SMAD4, SMARCA4. STK11,  TP53, TSC1, TSC2, and VHL.  The following genes were evaluated for sequence changes only: SDHA and HOXB13 c.251G>A variant only.   11/18/2019 Surgery   Right lumpectomy Donne Hazel): IDC, grade 3, 1.5cm, with grade 2-3 DCIS, 2 right axillary lymph nodes negative. FISH on pathology revealed HER-2 positivity.   12/09/2019 -  Chemotherapy   The patient had trastuzumab-hyaluronidase-oysk (HERCEPTIN HYLECTA) 600-10000 MG-UNT/5ML chemo SQ injection 600 mg, 600 mg, Subcutaneous,  Once, 10 of 17 cycles Administration: 600 mg (12/09/2019), 600 mg (12/30/2019), 600 mg (01/20/2020), 600 mg (02/10/2020), 600 mg (03/02/2020), 600 mg (03/23/2020), 600 mg (04/14/2020), 600 mg (05/04/2020), 600 mg (05/25/2020), 600 mg (06/15/2020)  for chemotherapy treatment.    12/28/2019 - 02/12/2020 Radiation Therapy   Adjuvant radiation   02/2020 -  Anti-estrogen oral therapy   Zoladex monthly until she underwent a BSO in 02/2020; Anastrozole started 02/2020     CURRENT THERAPY: Maintenance herceptin every 3 weeks; anastrozole daily  INTERVAL HISTORY: Kimberly Mueller 34 y.o. female returns for evaluation prior to receiving her maintenance herceptin.  Her most recent echo was completed on 06/22/2020 with Dr. Haroldine Laws.  Her EF was 50-55%, which was slightly lower than prior echo.  She will repeat echo in 6 weeks.  She also had a Zio patch placed to evaluate her palpitations.    She underwent bilateral breast mammogram on 06/22/2020.  This showed no evidence of malignancy and breast density category D.  A supplemental breast MRI was recommended to be ordered for 11/2020.    Kimberly Mueller is feeling well.  She was taken off of Trintellix 71m daily for anxiety/depression, and started Effexor XR at 37.535mper day.  She is still struggling with hot flashes still some, and feels her anxiety/depression could be better managed.      Patient Active Problem List   Diagnosis Date Noted  . Port-A-Cath in place 07/22/2019  . Genetic testing 06/15/2019   . Family history of prostate cancer   . Family history of pancreatic cancer   . Malignant neoplasm of lower-inner quadrant of right breast of female, estrogen receptor positive ([place]/16[place]2020    is allergic to adhesive [tape].  MEDICAL HISTORY: Past Medical History:  Diagnosis Date  . Anxiety   . Breast cancer ([place]  . Cancer ([place]   breast  . Depression   . Family history of pancreatic cancer   . Family history of prostate cancer   . GERD (gastroesophageal reflux disease)   . History of kidney stones     SURGICAL HISTORY: Past Surgical History:  Procedure Laterality Date  . BREAST BIOPSY Left 05/2019  . BREAST EXCISIONAL BIOPSY Right 05/2019  . BREAST LUMPECTOMY Right 05/2019  . BREAST LUMPECTOMY WITH RADIOACTIVE SEED AND SENTINEL LYMPH NODE BIOPSY Right 11/18/2019   Procedure: RIGHT BREAST SEED GUIDED LUMPECTOMY, RIGHT AXILLARY SENTINEL LYMPH NODE BIOSPY;  Surgeon: WaRolm BookbinderMD;  [place] Service: General;  Laterality: Right;  GENERAL AND PECTORAL BLOCK  . CYSTOSCOPY/RETROGRADE/URETEROSCOPY  06/24/2020   Procedure: CYSTOSCOPY/RETROGRADE/URETEROSCOPY;  Surgeon: SnBilley CoMD;  Location: [facility];  Service: Urology;;  . FOREIGN BODY REMOVAL N/A 11/18/2019   Procedure: NEDarrold JunkerEMOVAL;  Surgeon: WaRolm BookbinderMD;  [place] [facility] Service: General;  Laterality: N/A;  GENERAL AND PECTORAL BLOCK  . GASTRIC BYPASS    . HERNIA REPAIR    . PORT-A-CATH REMOVAL N/A 11/18/2019  Procedure: REMOVAL PORT-A-CATH;  Surgeon: Rolm Bookbinder, MD;  Location: [facility];  Service: General;  Laterality: N/A;  GENERAL AND PECTORAL BLOCK  . PORTACATH PLACEMENT Right 06/09/2019   Procedure: INSERTION PORT-A-CATH WITH ULTRASOUND;  Surgeon: Rolm Bookbinder, MD;  Location: [facility];  Service: General;  Laterality: Right;  . ROBOTIC ASSISTED BILATERAL SALPINGO OOPHERECTOMY Bilateral  03/08/2020   Procedure: XI ROBOTIC ASSISTED BILATERAL SALPINGO OOPHORECTOMY;  Surgeon: Everitt Amber, MD;  Location: [facility];  Service: Gynecology;  Laterality: Bilateral;    SOCIAL HISTORY: Social History   Socioeconomic History  . Marital status: Married    Spouse name: Not on file  . Number of children: Not on file  . Years of education: Not on file  . Highest education level: Not on file  Occupational History  . Not on file  Tobacco Use  . Smoking status: Former Research scientist (life sciences)  . Smokeless tobacco: Never Used  Vaping Use  . Vaping Use: Some days  Substance and Sexual Activity  . Alcohol use: Yes    Comment: occas.  . Drug use: Never  . Sexual activity: Not on file  Other Topics Concern  . Not on file  Social History Narrative  . Not on file   Social Determinants of Health   Financial Resource Strain: Not on file  Food Insecurity: Not on file  Transportation Needs: Not on file  Physical Activity: Not on file  Stress: Not on file  Social Connections: Not on file  Intimate Partner Violence: Not on file    FAMILY HISTORY: Family History  Problem Relation Age of Onset  . Prostate cancer Father   . Pancreatic cancer Paternal Grandmother     Review of Systems  Constitutional: Negative for appetite change, chills, fatigue, fever and unexpected weight change.  HENT:   Negative for hearing loss, lump/mass and trouble swallowing.   Eyes: Negative for eye problems and icterus.  Respiratory: Negative for chest tightness, cough and shortness of breath.   Cardiovascular: Negative for chest pain, leg swelling and palpitations.  Gastrointestinal: Negative for abdominal distention, abdominal pain, constipation, diarrhea, nausea and vomiting.  Endocrine: Negative for hot flashes.  Genitourinary: Negative for difficulty urinating.   Musculoskeletal: Negative for arthralgias.  Skin: Negative for itching and rash.  Neurological: Negative for dizziness, extremity weakness, headaches and  numbness.  Hematological: Negative for adenopathy. Does not bruise/bleed easily.  Psychiatric/Behavioral: Negative for depression. The patient is not nervous/anxious.       PHYSICAL EXAMINATION  ECOG PERFORMANCE STATUS: 1 - Symptomatic but completely ambulatory  Vitals:   07/06/20 0933  BP: 119/77  Pulse: 92  Resp: 18  Temp: (!) 97.5 F (36.4 C)  SpO2: 100%    Physical Exam Constitutional:      General: She is not in acute distress.    Appearance: Normal appearance. She is not toxic-appearing.  HENT:     Head: Normocephalic and atraumatic.  Eyes:     General: No scleral icterus. Cardiovascular:     Rate and Rhythm: Normal rate and regular rhythm.     Pulses: Normal pulses.     Heart sounds: Normal heart sounds.  Pulmonary:     Effort: Pulmonary effort is normal.     Breath sounds: Normal breath sounds.  Abdominal:     General: Abdomen is flat. Bowel sounds are normal. There is no distension.     Palpations: Abdomen is soft.     Tenderness: There is no abdominal tenderness.  Musculoskeletal:  General: No swelling.     Cervical back: Neck supple.  Lymphadenopathy:     Cervical: No cervical adenopathy.  Skin:    General: Skin is warm and dry.     Findings: No rash.  Neurological:     General: No focal deficit present.     Mental Status: She is alert.  Psychiatric:        Mood and Affect: Mood normal.        Behavior: Behavior normal.     LABORATORY DATA:  CBC    Component Value Date/Time   WBC 2.9 (L) 07/06/2020 0917   WBC 3.6 (L) 06/18/2020 0326   RBC 4.48 07/06/2020 0917   HGB 13.8 07/06/2020 0917   HCT 40.7 07/06/2020 0917   PLT 226 07/06/2020 0917   MCV 90.8 07/06/2020 0917   MCH 30.8 07/06/2020 0917   MCHC 33.9 07/06/2020 0917   RDW 12.3 07/06/2020 0917   LYMPHSABS 0.8 07/06/2020 0917   MONOABS 0.4 07/06/2020 0917   EOSABS 0.2 07/06/2020 0917   BASOSABS 0.0 07/06/2020 0917    CMP     Component Value Date/Time   NA 137 06/18/2020  0326   K 4.1 06/18/2020 0326   CL 103 06/18/2020 0326   CO2 26 06/18/2020 0326   GLUCOSE 103 (H) 06/18/2020 0326   BUN 20 06/18/2020 0326   CREATININE 0.45 06/18/2020 0326   CREATININE 0.73 06/15/2020 0854   CALCIUM 9.7 06/18/2020 0326   PROT 7.1 06/15/2020 0854   ALBUMIN 4.1 06/15/2020 0854   AST 31 06/15/2020 0854   ALT 26 06/15/2020 0854   ALKPHOS 84 06/15/2020 0854   BILITOT 0.6 06/15/2020 0854   GFRNONAA >60 06/18/2020 0326   GFRNONAA >60 06/15/2020 0854   GFRAA >60 03/02/2020 0910          ASSESSMENT and PLAN:   Malignant neoplasm of lower-inner quadrant of right breast of female, estrogen receptor positive ([place]) 05/15/2019:Patient palpated a right breast lump for two years. Diagnostic mammogram showed a 4.5cm right breast mass at the 6 o'clock position involving the overlying skin, 2 benign appearing left breast mass at the 10 o'clock position measuring 2.4cm and 2.1cm, and no axillary adenopathy. 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 Maintenance Herceptin subcutaneously every three weeks x 1 year starting 12/09/2019 3.Adjuvant radiation beginning 12/31/2019 4.Follow-up adjuvant antiestrogen therapyBSO scheduled 7/31, currently on Zoladex, to start Anastrozole mid  02/2020 ----------------------------------------------------------------------------------------------------------------------------------------------- 03/08/2020: Bilateral salpingo-oophorectomy  Herceptin: Will be completed May 2022 Echo on 06/22/2020 EF 50-55%--followed by Dr. Haroldine Laws, to be repeated in 6 weeks Weight loss: Prior gastric bypass surgery, stable Current treatment: Anastrozole and Herceptin  Anastrozole toxicities: 1.  Hot flashes: Improved with Effexor, increased effexor to 81m per day 2. fatigue: Slowly improving with time.  Insomnia issues Changed to valium by Dr. GLindi Adie Breast cancer surveillance: Mammograms in 06/2020 normal, breast density D.  MRI recommended and orders placed    We will see her back with every other Herceptin.  Orders Placed This Encounter  Procedures  . MR BREAST BILATERAL W WO CONTRAST INC CAD    Standing Status:   Future    Standing Expiration Date:   07/06/2021    Order Specific Question:   If indicated for the ordered procedure, I authorize the administration of contrast media per Radiology protocol    Answer:   Yes    Order Specific Question:   What is the patient's sedation requirement?    Answer:   No Sedation    Order Specific Question:   Does the patient have a pacemaker or implanted devices?    Answer:   No    Order Specific Question:   Radiology Contrast Protocol - do NOT remove file path    Answer:   [email or web address]    Order Specific Question:   Preferred imaging location?    Answer:   [facility] (table limit - 550 lbs)    All questions were answered. The patient knows to call the clinic with any problems, questions or concerns. We can certainly see the patient much sooner if necessary.  Total encounter time: 20 minutes*  Wilber Bihari, NP 07/06/20 9:47 AM Medical Oncology and Hematology [facility] [place], [place] [address] Tel.  [phone]    Fax. [phone]  *Total Encounter Time as defined by the Centers for Medicare and Medicaid Services includes, in addition to the face-to-face time of a patient visit (documented in the note above) non-face-to-face time: obtaining and reviewing outside history, ordering and reviewing medications, tests or procedures, care coordination (communications with other health care professionals or caregivers) and documentation in the medical record.

## 2020-07-06 ENCOUNTER — Inpatient Hospital Stay: Payer: No Typology Code available for payment source

## 2020-07-06 ENCOUNTER — Inpatient Hospital Stay (HOSPITAL_BASED_OUTPATIENT_CLINIC_OR_DEPARTMENT_OTHER): Payer: No Typology Code available for payment source | Admitting: Adult Health

## 2020-07-06 ENCOUNTER — Encounter: Payer: Self-pay | Admitting: Adult Health

## 2020-07-06 ENCOUNTER — Other Ambulatory Visit: Payer: Self-pay

## 2020-07-06 VITALS — BP 119/77 | HR 92 | Temp 97.5°F | Resp 18 | Ht 63.0 in | Wt 111.6 lb

## 2020-07-06 DIAGNOSIS — Z170 Estrogen receptor positive status [ER+]: Secondary | ICD-10-CM | POA: Diagnosis not present

## 2020-07-06 DIAGNOSIS — C50311 Malignant neoplasm of lower-inner quadrant of right female breast: Secondary | ICD-10-CM | POA: Diagnosis not present

## 2020-07-06 DIAGNOSIS — Z5112 Encounter for antineoplastic immunotherapy: Secondary | ICD-10-CM | POA: Diagnosis not present

## 2020-07-06 LAB — CBC WITH DIFFERENTIAL (CANCER CENTER ONLY)
Abs Immature Granulocytes: 0 10*3/uL (ref 0.00–0.07)
Basophils Absolute: 0 10*3/uL (ref 0.0–0.1)
Basophils Relative: 1 %
Eosinophils Absolute: 0.2 10*3/uL (ref 0.0–0.5)
Eosinophils Relative: 7 %
HCT: 40.7 % (ref 36.0–46.0)
Hemoglobin: 13.8 g/dL (ref 12.0–15.0)
Immature Granulocytes: 0 %
Lymphocytes Relative: 28 %
Lymphs Abs: 0.8 10*3/uL (ref 0.7–4.0)
MCH: 30.8 pg (ref 26.0–34.0)
MCHC: 33.9 g/dL (ref 30.0–36.0)
MCV: 90.8 fL (ref 80.0–100.0)
Monocytes Absolute: 0.4 10*3/uL (ref 0.1–1.0)
Monocytes Relative: 12 %
Neutro Abs: 1.5 10*3/uL — ABNORMAL LOW (ref 1.7–7.7)
Neutrophils Relative %: 52 %
Platelet Count: 226 10*3/uL (ref 150–400)
RBC: 4.48 MIL/uL (ref 3.87–5.11)
RDW: 12.3 % (ref 11.5–15.5)
WBC Count: 2.9 10*3/uL — ABNORMAL LOW (ref 4.0–10.5)
nRBC: 0 % (ref 0.0–0.2)

## 2020-07-06 LAB — CMP (CANCER CENTER ONLY)
ALT: 34 U/L (ref 0–44)
AST: 33 U/L (ref 15–41)
Albumin: 4.1 g/dL (ref 3.5–5.0)
Alkaline Phosphatase: 77 U/L (ref 38–126)
Anion gap: 7 (ref 5–15)
BUN: 19 mg/dL (ref 6–20)
CO2: 30 mmol/L (ref 22–32)
Calcium: 10 mg/dL (ref 8.9–10.3)
Chloride: 105 mmol/L (ref 98–111)
Creatinine: 0.7 mg/dL (ref 0.44–1.00)
GFR, Estimated: >60 mL/min (ref >60)
Glucose, Bld: 89 mg/dL (ref 70–99)
Potassium: 4 mmol/L (ref 3.5–5.1)
Sodium: 142 mmol/L (ref 135–145)
Total Bilirubin: 0.5 mg/dL (ref 0.3–1.2)
Total Protein: 7.2 g/dL (ref 6.5–8.1)

## 2020-07-06 MED ORDER — ACETAMINOPHEN 325 MG PO TABS
650.0000 mg | ORAL_TABLET | Freq: Once | ORAL | Status: AC
  Administered 2020-07-06: 650 mg via ORAL

## 2020-07-06 MED ORDER — VENLAFAXINE HCL ER 75 MG PO CP24
75.0000 mg | ORAL_CAPSULE | Freq: Every day | ORAL | 3 refills | Status: DC
Start: 2020-07-06 — End: 2020-09-28

## 2020-07-06 MED ORDER — ACETAMINOPHEN 325 MG PO TABS
ORAL_TABLET | ORAL | Status: AC
  Filled 2020-07-06: qty 2

## 2020-07-06 MED ORDER — DIPHENHYDRAMINE HCL 25 MG PO CAPS
50.0000 mg | ORAL_CAPSULE | Freq: Once | ORAL | Status: AC
  Administered 2020-07-06: 50 mg via ORAL

## 2020-07-06 MED ORDER — DIPHENHYDRAMINE HCL 25 MG PO CAPS
ORAL_CAPSULE | ORAL | Status: AC
  Filled 2020-07-06: qty 2

## 2020-07-06 MED ORDER — TRASTUZUMAB-HYALURONIDASE-OYSK 600-10000 MG-UNT/5ML ~~LOC~~ SOLN
600.0000 mg | Freq: Once | SUBCUTANEOUS | Status: AC
  Administered 2020-07-06: 600 mg via SUBCUTANEOUS
  Filled 2020-07-06: qty 5

## 2020-07-06 NOTE — Patient Instructions (Signed)
Cancer Center Discharge Instructions for Patients Receiving Chemotherapy  Today you received the following chemotherapy agents herceptin hylecta   To help prevent nausea and vomiting after your treatment, we encourage you to take your nausea medication as directed.    If you develop nausea and vomiting that is not controlled by your nausea medication, call the clinic.   BELOW ARE SYMPTOMS THAT SHOULD BE REPORTED IMMEDIATELY:  *FEVER GREATER THAN 100.5 F  *CHILLS WITH OR WITHOUT FEVER  NAUSEA AND VOMITING THAT IS NOT CONTROLLED WITH YOUR NAUSEA MEDICATION  *UNUSUAL SHORTNESS OF BREATH  *UNUSUAL BRUISING OR BLEEDING  TENDERNESS IN MOUTH AND THROAT WITH OR WITHOUT PRESENCE OF ULCERS  *URINARY PROBLEMS  *BOWEL PROBLEMS  UNUSUAL RASH Items with * indicate a potential emergency and should be followed up as soon as possible.  Feel free to call the clinic should you have any questions or concerns. The clinic phone number is [phone].  Please show the CHEMO ALERT CARD at check-in to the Emergency Department and triage nurse.   

## 2020-07-15 ENCOUNTER — Other Ambulatory Visit: Payer: Self-pay | Admitting: Adult Health

## 2020-07-15 DIAGNOSIS — Z170 Estrogen receptor positive status [ER+]: Secondary | ICD-10-CM

## 2020-07-15 DIAGNOSIS — C50311 Malignant neoplasm of lower-inner quadrant of right female breast: Secondary | ICD-10-CM

## 2020-07-18 NOTE — Addendum Note (Signed)
Encounter addended by: Micki Riley, RN on: 07/18/2020 1:06 PM  Actions taken: Imaging Exam ended

## 2020-07-26 NOTE — Assessment & Plan Note (Signed)
05/15/2019:Patient palpated a right breast lump for two years. Diagnostic mammogram showed a 4.5cm right breast mass at the 6 o'clock position involving the overlying skin, 2 benign appearing left breast mass at the 10 o'clock position measuring 2.4cm and 2.1cm, and no axillary adenopathy. 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 Maintenance Herceptin subcutaneously every three weeks x 1 year starting 12/09/2019 3.Adjuvant radiation beginning 12/31/2019 4.Follow-up adjuvant antiestrogen therapyBSO scheduled 7/31, currently on Zoladex, to start Anastrozole mid 02/2020 ----------------------------------------------------------------------------------------------------------------------------------------------- 03/08/2020: Bilateral salpingo-oophorectomy  Herceptin:Will be completedMay 2022 Echo on 06/22/2020 EF 50-55%--followed by Dr. Haroldine Laws, to be repeated in 6 weeks Weight loss:Prior gastric bypass surgery, stable Current treatment: Anastrozole and Herceptin  Anastrozole toxicities: 1.Hot flashes: Improved with Effexor now on 80m per day 2.fatigue: Marked improvement.  Insomnia issues : Currently on Valium  Breast cancer surveillance: Mammograms in 06/2020 normal, breast density D.  MRI recommended and orders placed  Return to clinic every 3 weeks for Herceptin every 6 weeks for follow-up with me.

## 2020-07-26 NOTE — Progress Notes (Signed)
Patient Care Team: Jilda Panda, MD as PCP - General (Internal Medicine) Nicholas Lose, MD as Consulting Physician (Hematology and Oncology) Rolm Bookbinder, MD as Consulting Physician (General Surgery) Gery Pray, MD as Consulting Physician (Radiation Oncology) Rockwell Germany, RN as Oncology Nurse Navigator Mauro Kaufmann, RN as Oncology Nurse Navigator  DIAGNOSIS:    ICD-10-CM   1. Malignant neoplasm of lower-inner quadrant of right breast of female, estrogen receptor positive ([place])  C50.311    Z17.0     SUMMARY OF ONCOLOGIC HISTORY: Oncology History  Malignant neoplasm of lower-inner quadrant of right breast of female, estrogen receptor positive ([place])  05/25/2019 Initial Diagnosis   Patient palpated a right breast lump for two years. Diagnostic mammogram showed a 4.5cm right breast mass at the 6 o'clock position involving the overlying skin, 2 benign appearing left breast mass at the 10 o'clock position measuring 2.4cm and 2.1cm, and no axillary adenopathy. Biopsy showed: in the left breast, fibroadenoma and no evidence of malignancy, and in the right breast, IDC with DCIS, grade 3.  ER 70%, PR 30%, Ki-67 40%, HER-2 negative by [facility]    06/10/2019 - 10/28/2019 Chemotherapy   The patient had dexamethasone (DECADRON) 4 MG tablet, 4 mg (100 % of original dose 4 mg), Oral, Daily, 1 of 1 cycle, Start date: 06/05/2019, End date: 08/12/2019 Dose modification: 4 mg (original dose 4 mg, Cycle 0) DOXOrubicin (ADRIAMYCIN) chemo injection 118 mg, 60 mg/m2 = 118 mg, Intravenous,  Once, 4 of 4 cycles Dose modification: 50 mg/m2 (original dose 60 mg/m2, Cycle 2, Reason: Dose not tolerated) Administration: 118 mg (06/10/2019), 98 mg (06/24/2019), 98 mg (07/08/2019), 98 mg (07/22/2019) palonosetron (ALOXI) injection 0.25 mg, 0.25 mg, Intravenous,  Once, 4 of 4 cycles Administration: 0.25 mg (06/10/2019), 0.25 mg (06/24/2019), 0.25 mg (07/08/2019), 0.25 mg (07/22/2019) pegfilgrastim-cbqv (UDENYCA)  injection 6 mg, 6 mg, Subcutaneous, Once, 4 of 4 cycles Administration: 6 mg (06/12/2019), 6 mg (06/26/2019), 6 mg (07/10/2019), 6 mg (07/24/2019) cyclophosphamide (CYTOXAN) 1,180 mg in sodium chloride 0.9 % 250 mL chemo infusion, 600 mg/m2 = 1,180 mg, Intravenous,  Once, 4 of 4 cycles Dose modification: 500 mg/m2 (original dose 600 mg/m2, Cycle 2, Reason: Dose not tolerated) Administration: 1,180 mg (06/10/2019), 980 mg (06/24/2019), 980 mg (07/08/2019), 980 mg (07/22/2019) PACLitaxel (TAXOL) 156 mg in sodium chloride 0.9 % 250 mL chemo infusion (</= 97m/m2), 80 mg/m2 = 156 mg, Intravenous,  Once, 12 of 12 cycles Dose modification: 65 mg/m2 (original dose 80 mg/m2, Cycle 11, Reason: Provider Judgment), 50 mg/m2 (original dose 80 mg/m2, Cycle 12, Reason: Dose not tolerated) Administration: 156 mg (08/12/2019), 156 mg (08/19/2019), 138 mg (08/26/2019), 138 mg (09/02/2019), 138 mg (09/09/2019), 138 mg (09/16/2019), 114 mg (09/23/2019), 90 mg (09/30/2019), 90 mg (10/07/2019), 90 mg (10/14/2019), 90 mg (10/21/2019), 90 mg (10/28/2019) fosaprepitant (EMEND) 150 mg, dexamethasone (DECADRON) 12 mg in sodium chloride 0.9 % 145 mL IVPB, , Intravenous,  Once, 4 of 4 cycles Administration:  (06/10/2019),  (06/24/2019),  (07/08/2019),  (07/22/2019)  for chemotherapy treatment.     Genetic Testing   No pathogenic variants identified. [place] c.2378A>G identified on the [facility] Common Hereditary Cancers Panel. The report date is 06/15/2019.  The Common Hereditary Cancers Panel offered by [facility] includes sequencing and/or deletion duplication testing of the following 48 genes: APC, ATM, AXIN2, BARD1, BMPR1A, BRCA1, BRCA2, BRIP1, CDH1, CDKN2A (p14ARF), CDKN2A (p16INK4a), CKD4, CHEK2, CTNNA1, DICER1, EPCAM (Deletion/duplication testing only), GREM1 (promoter region deletion/duplication testing only), KIT, MEN1, MLH1, MSH2, MSH3, MSH6,  MUTYH, NBN, NF1, NHTL1, PALB2, PDGFRA, PMS2, POLD1, POLE, PTEN, RAD50, RAD51C, RAD51D, RNF43,  SDHB, SDHC, SDHD, SMAD4, SMARCA4. STK11, TP53, TSC1, TSC2, and VHL.  The following genes were evaluated for sequence changes only: SDHA and HOXB13 c.251G>A variant only.   11/18/2019 Surgery   Right lumpectomy Donne Hazel): IDC, grade 3, 1.5cm, with grade 2-3 DCIS, 2 right axillary lymph nodes negative. FISH on pathology revealed HER-2 positivity.   12/09/2019 -  Chemotherapy   The patient had trastuzumab-hyaluronidase-oysk (HERCEPTIN HYLECTA) 600-10000 MG-UNT/5ML chemo SQ injection 600 mg, 600 mg, Subcutaneous,  Once, 11 of 17 cycles Administration: 600 mg (12/09/2019), 600 mg (12/30/2019), 600 mg (01/20/2020), 600 mg (02/10/2020), 600 mg (03/02/2020), 600 mg (03/23/2020), 600 mg (04/14/2020), 600 mg (05/04/2020), 600 mg (05/25/2020), 600 mg (06/15/2020), 600 mg (07/06/2020)  for chemotherapy treatment.    12/28/2019 - 02/12/2020 Radiation Therapy   Adjuvant radiation   02/2020 -  Anti-estrogen oral therapy   Zoladex monthly until she underwent a BSO in 02/2020; Anastrozole started 02/2020     CHIEF COMPLIANT: Follow-up of right breast cancer on anastrozole, Herceptin maintenance  INTERVAL HISTORY: Kimberly Mueller is a 35 y.o. with above-mentioned history of right breast cancerwho completedneoadjuvant chemotherapy,lumpectomy, radiation, and is currently on Herceptin maintenance and antiestrogen therapy with anastrozole.Mammogram on 06/22/20 showed no evidence of malignancy bilaterally. She presents to the clinic todayfor treatment.  She reports that the depression symptoms are not significantly better even at the 75 mg dosage but this was just changed recently so she is watching and monitoring for now.  ALLERGIES:  is allergic to adhesive [tape].  MEDICATIONS:  Current Outpatient Medications  Medication Sig Dispense Refill   acetaminophen (TYLENOL) 500 MG tablet Take 1,000 mg by mouth every 6 (six) hours as needed for mild pain or moderate pain.     anastrozole (ARIMIDEX) 1 MG tablet TAKE 1 TABLET BY  MOUTH EVERY DAY 90 tablet 2   bimatoprost (LATISSE) 0.03 % ophthalmic solution Place 1 application into both eyes at bedtime. Place one drop on applicator and apply evenly along the skin of the upper eyelid at base of eyelashes once daily at bedtime; repeat procedure for second eye (use a clean applicator). (Patient taking differently: Place 1 application into both eyes See admin instructions. Place one drop on applicator and apply evenly along the skin of the upper eyelid at base of eyelashes once every other day at bedtime) 5 mL 12   Calcium Carbonate-Vit D-Min (CALTRATE PLUS PO) Take 1 tablet by mouth daily.     cephALEXin (KEFLEX) 500 MG capsule Take 1 capsule (500 mg total) by mouth 3 (three) times daily. 21 capsule 0   Cholecalciferol (VITAMIN D3 PO) Take 1 capsule by mouth daily.     COLLAGEN PO Take 2 Sco[place] by mouth daily.     Cyanocobalamin (B-12 PO) Take 1 tablet by mouth daily.     diphenhydramine-acetaminophen (TYLENOL PM) 25-500 MG TABS tablet Take 2 tablets by mouth at bedtime as needed (sleep).     docusate sodium (COLACE) 250 MG capsule Take 250 mg by mouth at bedtime.     doxylamine, Sleep, (UNISOM) 25 MG tablet Take 25 mg by mouth at bedtime as needed for sleep.     gabapentin (NEURONTIN) 100 MG capsule TAKE 1 CAPSULE BY MOUTH AT BEDTIME. (Patient taking differently: Take 100 mg by mouth at bedtime.) 30 capsule 3   Multiple Vitamins-Minerals (MULTIVITAMIN WITH MINERALS) tablet Take 1 tablet by mouth daily. [place]     omeprazole (PRILOSEC) 40  MG capsule Take 1 capsule (40 mg total) by mouth daily. (Patient taking differently: Take 40 mg by mouth daily as needed (acid reflux).) 90 capsule 3   ondansetron (ZOFRAN ODT) 4 MG disintegrating tablet Take 1 tablet (4 mg total) by mouth every 8 (eight) hours as needed for nausea or vomiting. 30 tablet 0   oxyCODONE-acetaminophen (PERCOCET/ROXICET) 5-325 MG tablet Take 1 tablet by mouth every 4 (four) hours as needed for severe  pain. 30 tablet 0   polyethylene glycol (MIRALAX / GLYCOLAX) 17 g packet Take 17 g by mouth daily.     venlafaxine XR (EFFEXOR-XR) 75 MG 24 hr capsule Take 1 capsule (75 mg total) by mouth daily with breakfast. 90 capsule 3   zolpidem (AMBIEN) 10 MG tablet Take 1 tablet (10 mg total) by mouth at bedtime as needed for sleep. (Patient taking differently: Take 5 mg by mouth at bedtime.) 30 tablet 3   No current facility-administered medications for this visit.    PHYSICAL EXAMINATION: ECOG PERFORMANCE STATUS: 1 - Symptomatic but completely ambulatory  Vitals:   07/27/20 0855  BP: 123/84  Pulse: 89  Resp: 16  Temp: 97.7 F (36.5 C)  SpO2: 100%   Filed Weights   07/27/20 0855  Weight: 118 lb 6.4 oz (53.7 kg)    LABORATORY DATA:  I have reviewed the data as listed CMP Latest Ref Rng & Units 07/27/2020 07/06/2020 06/18/2020  Glucose 70 - 99 mg/dL 76 89 103(H)  BUN 6 - 20 mg/dL 26(H) 19 20  Creatinine 0.44 - 1.00 mg/dL 0.72 0.70 0.45  Sodium 135 - 145 mmol/L 137 142 137  Potassium 3.5 - 5.1 mmol/L 5.2(H) 4.0 4.1  Chloride 98 - 111 mmol/L 102 105 103  CO2 22 - 32 mmol/L '29 30 26  ' Calcium 8.9 - 10.3 mg/dL 9.6 10.0 9.7  Total Protein 6.5 - 8.1 g/dL 7.0 7.2 -  Total Bilirubin 0.3 - 1.2 mg/dL 0.5 0.5 -  Alkaline Phos 38 - 126 U/L 74 77 -  AST 15 - 41 U/L 61(H) 33 -  ALT 0 - 44 U/L 56(H) 34 -    Lab Results  Component Value Date   WBC 2.7 (L) 07/27/2020   HGB 13.4 07/27/2020   HCT 39.6 07/27/2020   MCV 91.0 07/27/2020   PLT 226 07/27/2020   NEUTROABS 1.4 (L) 07/27/2020    ASSESSMENT & PLAN:  Malignant neoplasm of lower-inner quadrant of right breast of female, estrogen receptor positive ([place]) 05/15/2019:Patient palpated a right breast lump for two years. Diagnostic mammogram showed a 4.5cm right breast mass at the 6 o'clock position involving the overlying skin, 2 benign appearing left breast mass at the 10 o'clock position measuring 2.4cm and 2.1cm, and no axillary  adenopathy. 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 Maintenance Herceptin subcutaneously every three weeks x 1 year starting 12/09/2019 3.Adjuvant radiation beginning 12/31/2019 4.Follow-up adjuvant antiestrogen therapyBSO scheduled 7/31, currently on Zoladex, to start Anastrozole mid 02/2020 ----------------------------------------------------------------------------------------------------------------------------------------------- 03/08/2020: Bilateral salpingo-oophorectomy  Herceptin:Will  be completedMay 2022 Echo on 06/22/2020 EF 50-55%--followed by Dr. Haroldine Laws, to be repeated in 6 weeks Weight loss:Prior gastric bypass surgery, stable Current treatment: Anastrozole and Herceptin  Anastrozole toxicities: 1.Hot flashes: Improved with Effexor now on 9m per day 2.fatigue: Marked improvement. 3.  Elevation of LFTs: This will need to be monitored.  Potential cause could be anastrozole.  She also drinks alcohol several times in the week. Having had prior gastric bypass surgery, she thinks that she gets tipsy very quickly.  Depression: Patient does not yet have good relief from depression at 75 mg.  However she wants to wait another 3 more weeks to see if the increased dosage of Effexor is still going to help her.  If necessary we can go up on the dose to 150 mg.  Insomnia issues : Currently on Valium  Breast cancer  surveillance: Mammograms in 06/2020 normal, breast density D.  MRI recommended and orders placed  Return to clinic every 3 weeks for Herceptin every 6 weeks for follow-up with me.     No orders of the defined types were placed in this encounter.  The patient has a good understanding of the overall plan. she agrees with it. she will call with any problems that may develop before the next visit here.  Total time spent: 30 mins including face to face time and time spent for planning, charting and coordination of care  GNicholas Lose MD 07/27/2020  I, MCloyde ReamsDorshimer, am acting as scribe for Dr. VNicholas Lose  I have reviewed the above documentation for accuracy and completeness, and I agree with the above.

## 2020-07-27 ENCOUNTER — Encounter: Payer: Self-pay | Admitting: *Deleted

## 2020-07-27 ENCOUNTER — Inpatient Hospital Stay: Payer: No Typology Code available for payment source | Attending: Hematology and Oncology

## 2020-07-27 ENCOUNTER — Inpatient Hospital Stay: Payer: No Typology Code available for payment source

## 2020-07-27 ENCOUNTER — Inpatient Hospital Stay (HOSPITAL_BASED_OUTPATIENT_CLINIC_OR_DEPARTMENT_OTHER): Payer: No Typology Code available for payment source | Admitting: Hematology and Oncology

## 2020-07-27 ENCOUNTER — Other Ambulatory Visit: Payer: Self-pay

## 2020-07-27 DIAGNOSIS — Z170 Estrogen receptor positive status [ER+]: Secondary | ICD-10-CM | POA: Insufficient documentation

## 2020-07-27 DIAGNOSIS — Z95828 Presence of other vascular implants and grafts: Secondary | ICD-10-CM

## 2020-07-27 DIAGNOSIS — C50311 Malignant neoplasm of lower-inner quadrant of right female breast: Secondary | ICD-10-CM

## 2020-07-27 DIAGNOSIS — Z5112 Encounter for antineoplastic immunotherapy: Secondary | ICD-10-CM | POA: Insufficient documentation

## 2020-07-27 LAB — CMP (CANCER CENTER ONLY)
ALT: 56 U/L — ABNORMAL HIGH (ref 0–44)
AST: 61 U/L — ABNORMAL HIGH (ref 15–41)
Albumin: 4 g/dL (ref 3.5–5.0)
Alkaline Phosphatase: 74 U/L (ref 38–126)
Anion gap: 6 (ref 5–15)
BUN: 26 mg/dL — ABNORMAL HIGH (ref 6–20)
CO2: 29 mmol/L (ref 22–32)
Calcium: 9.6 mg/dL (ref 8.9–10.3)
Chloride: 102 mmol/L (ref 98–111)
Creatinine: 0.72 mg/dL (ref 0.44–1.00)
GFR, Estimated: >60 mL/min (ref >60)
Glucose, Bld: 76 mg/dL (ref 70–99)
Potassium: 5.2 mmol/L — ABNORMAL HIGH (ref 3.5–5.1)
Sodium: 137 mmol/L (ref 135–145)
Total Bilirubin: 0.5 mg/dL (ref 0.3–1.2)
Total Protein: 7 g/dL (ref 6.5–8.1)

## 2020-07-27 LAB — CBC WITH DIFFERENTIAL (CANCER CENTER ONLY)
Abs Immature Granulocytes: 0.01 10*3/uL (ref 0.00–0.07)
Basophils Absolute: 0 10*3/uL (ref 0.0–0.1)
Basophils Relative: 0 %
Eosinophils Absolute: 0.1 10*3/uL (ref 0.0–0.5)
Eosinophils Relative: 5 %
HCT: 39.6 % (ref 36.0–46.0)
Hemoglobin: 13.4 g/dL (ref 12.0–15.0)
Immature Granulocytes: 0 %
Lymphocytes Relative: 29 %
Lymphs Abs: 0.8 10*3/uL (ref 0.7–4.0)
MCH: 30.8 pg (ref 26.0–34.0)
MCHC: 33.8 g/dL (ref 30.0–36.0)
MCV: 91 fL (ref 80.0–100.0)
Monocytes Absolute: 0.3 10*3/uL (ref 0.1–1.0)
Monocytes Relative: 13 %
Neutro Abs: 1.4 10*3/uL — ABNORMAL LOW (ref 1.7–7.7)
Neutrophils Relative %: 53 %
Platelet Count: 226 10*3/uL (ref 150–400)
RBC: 4.35 MIL/uL (ref 3.87–5.11)
RDW: 12.1 % (ref 11.5–15.5)
WBC Count: 2.7 10*3/uL — ABNORMAL LOW (ref 4.0–10.5)
nRBC: 0 % (ref 0.0–0.2)

## 2020-07-27 MED ORDER — DIPHENHYDRAMINE HCL 25 MG PO CAPS
50.0000 mg | ORAL_CAPSULE | Freq: Once | ORAL | Status: AC
  Administered 2020-07-27: 50 mg via ORAL

## 2020-07-27 MED ORDER — ACETAMINOPHEN 325 MG PO TABS
ORAL_TABLET | ORAL | Status: AC
  Filled 2020-07-27: qty 2

## 2020-07-27 MED ORDER — ACETAMINOPHEN 325 MG PO TABS
650.0000 mg | ORAL_TABLET | Freq: Once | ORAL | Status: AC
  Administered 2020-07-27: 650 mg via ORAL

## 2020-07-27 MED ORDER — TRASTUZUMAB-HYALURONIDASE-OYSK 600-10000 MG-UNT/5ML ~~LOC~~ SOLN
600.0000 mg | Freq: Once | SUBCUTANEOUS | Status: AC
  Administered 2020-07-27: 600 mg via SUBCUTANEOUS
  Filled 2020-07-27: qty 5

## 2020-07-27 MED ORDER — SODIUM CHLORIDE 0.9% FLUSH
10.0000 mL | INTRAVENOUS | Status: DC | PRN
  Filled 2020-07-27: qty 10

## 2020-07-27 MED ORDER — DIPHENHYDRAMINE HCL 25 MG PO CAPS
ORAL_CAPSULE | ORAL | Status: AC
  Filled 2020-07-27: qty 2

## 2020-07-27 NOTE — Progress Notes (Signed)
Patient o longer has a port sent back to lab for blood draw.

## 2020-07-27 NOTE — Patient Instructions (Signed)

## 2020-07-28 ENCOUNTER — Telehealth: Payer: Self-pay | Admitting: Hematology and Oncology

## 2020-07-28 NOTE — Telephone Encounter (Signed)
Scheduled per 11/9 los. Pt will receive an updated appt calendar per next visit appt notes  

## 2020-08-04 ENCOUNTER — Other Ambulatory Visit (HOSPITAL_COMMUNITY): Payer: No Typology Code available for payment source

## 2020-08-04 ENCOUNTER — Encounter (HOSPITAL_COMMUNITY): Payer: No Typology Code available for payment source | Admitting: Internal Medicine

## 2020-08-12 ENCOUNTER — Other Ambulatory Visit: Payer: Self-pay | Admitting: Medical

## 2020-08-12 ENCOUNTER — Telehealth: Payer: Self-pay | Admitting: *Deleted

## 2020-08-12 MED ORDER — AMOXICILLIN-POT CLAVULANATE 875-125 MG PO TABS: 1.0000 | ORAL_TABLET | Freq: Two times a day (BID) | ORAL | 0 refills | Status: DC

## 2020-08-12 NOTE — Telephone Encounter (Signed)
Pt called with c/o sore throat x 3 days with fatigue. Informed " I feel like I have strep throat". Pt with h/o strep throat. Denies fever, congestion, cough or cold/flu/covid symptoms Per Lucianne Lei prescription sent to pharmacy.  Informed pt that if symptoms are not better by next week to call and need to be seen.

## 2020-08-16 ENCOUNTER — Encounter: Payer: Self-pay | Admitting: *Deleted

## 2020-08-16 NOTE — Progress Notes (Signed)
HEMATOLOGY-ONCOLOGY MYCHART VIDEO VISIT PROGRESS NOTE  I connected with Kimberly Mueller on 08/17/2020 at  9:30 AM EST by MyChart video conference and verified that I am speaking with the correct person using two identifiers.  I discussed the limitations, risks, security and privacy concerns of performing an evaluation and management service by MyChart and the availability of in person appointments.  I also discussed with the patient that there may be a patient responsible charge related to this service. The patient expressed understanding and agreed to proceed.  Patient's Location: Home Physician Location: Clinic  CHIEF COMPLIANT: Follow-up of right breast cancer on anastrozole, Herceptin maintenance  INTERVAL HISTORY: Kimberly Mueller is a 35 y.o. female with above-mentioned history of breast cancerwho completedneoadjuvant chemotherapy,lumpectomy, radiation, and is currently on Herceptin maintenance and antiestrogen therapy with anastrozole.Mammogram on 06/22/20 showed no evidence of malignancy bilaterally. She presents to the clinic todayfortreatment.   Oncology History  Malignant neoplasm of lower-inner quadrant of right breast of female, estrogen receptor positive ([place])  05/25/2019 Initial Diagnosis   Patient palpated a right breast lump for two years. Diagnostic mammogram showed a 4.5cm right breast mass at the 6 o'clock position involving the overlying skin, 2 benign appearing left breast mass at the 10 o'clock position measuring 2.4cm and 2.1cm, and no axillary adenopathy. Biopsy showed: in the left breast, fibroadenoma and no evidence of malignancy, and in the right breast, IDC with DCIS, grade 3.  ER 70%, PR 30%, Ki-67 40%, HER-2 negative by [facility]    06/10/2019 - 10/28/2019 Chemotherapy   The patient had dexamethasone (DECADRON) 4 MG tablet, 4 mg (100 % of original dose 4 mg), Oral, Daily, 1 of 1 cycle, Start date: 06/05/2019, End date: 08/12/2019 Dose modification: 4 mg (original dose 4  mg, Cycle 0) DOXOrubicin (ADRIAMYCIN) chemo injection 118 mg, 60 mg/m2 = 118 mg, Intravenous,  Once, 4 of 4 cycles Dose modification: 50 mg/m2 (original dose 60 mg/m2, Cycle 2, Reason: Dose not tolerated) Administration: 118 mg (06/10/2019), 98 mg (06/24/2019), 98 mg (07/08/2019), 98 mg (07/22/2019) palonosetron (ALOXI) injection 0.25 mg, 0.25 mg, Intravenous,  Once, 4 of 4 cycles Administration: 0.25 mg (06/10/2019), 0.25 mg (06/24/2019), 0.25 mg (07/08/2019), 0.25 mg (07/22/2019) pegfilgrastim-cbqv (UDENYCA) injection 6 mg, 6 mg, Subcutaneous, Once, 4 of 4 cycles Administration: 6 mg (06/12/2019), 6 mg (06/26/2019), 6 mg (07/10/2019), 6 mg (07/24/2019) cyclophosphamide (CYTOXAN) 1,180 mg in sodium chloride 0.9 % 250 mL chemo infusion, 600 mg/m2 = 1,180 mg, Intravenous,  Once, 4 of 4 cycles Dose modification: 500 mg/m2 (original dose 600 mg/m2, Cycle 2, Reason: Dose not tolerated) Administration: 1,180 mg (06/10/2019), 980 mg (06/24/2019), 980 mg (07/08/2019), 980 mg (07/22/2019) PACLitaxel (TAXOL) 156 mg in sodium chloride 0.9 % 250 mL chemo infusion (</= 11m/m2), 80 mg/m2 = 156 mg, Intravenous,  Once, 12 of 12 cycles Dose modification: 65 mg/m2 (original dose 80 mg/m2, Cycle 11, Reason: Provider Judgment), 50 mg/m2 (original dose 80 mg/m2, Cycle 12, Reason: Dose not tolerated) Administration: 156 mg (08/12/2019), 156 mg (08/19/2019), 138 mg (08/26/2019), 138 mg (09/02/2019), 138 mg (09/09/2019), 138 mg (09/16/2019), 114 mg (09/23/2019), 90 mg (09/30/2019), 90 mg (10/07/2019), 90 mg (10/14/2019), 90 mg (10/21/2019), 90 mg (10/28/2019) fosaprepitant (EMEND) 150 mg, dexamethasone (DECADRON) 12 mg in sodium chloride 0.9 % 145 mL IVPB, , Intravenous,  Once, 4 of 4 cycles Administration:  (06/10/2019),  (06/24/2019),  (07/08/2019),  (07/22/2019)  for chemotherapy treatment.     Genetic Testing   No pathogenic variants identified. VUS in [place] called c.2378A>G identified on the  [facility] Common Hereditary Cancers Panel. The  report date is 06/15/2019.  The Common Hereditary Cancers Panel offered by [facility] includes sequencing and/or deletion duplication testing of the following 48 genes: APC, ATM, AXIN2, BARD1, BMPR1A, BRCA1, BRCA2, BRIP1, CDH1, CDKN2A (p14ARF), CDKN2A (p16INK4a), CKD4, CHEK2, CTNNA1, DICER1, EPCAM (Deletion/duplication testing only), GREM1 (promoter region deletion/duplication testing only), KIT, MEN1, MLH1, MSH2, MSH3, MSH6, MUTYH, NBN, NF1, NHTL1, PALB2, PDGFRA, PMS2, POLD1, POLE, PTEN, RAD50, RAD51C, RAD51D, RNF43, SDHB, SDHC, SDHD, SMAD4, SMARCA4. STK11, TP53, TSC1, TSC2, and VHL.  The following genes were evaluated for sequence changes only: SDHA and HOXB13 c.251G>A variant only.   11/18/2019 Surgery   Right lumpectomy Donne Hazel): IDC, grade 3, 1.5cm, with grade 2-3 DCIS, 2 right axillary lymph nodes negative. FISH on pathology revealed HER-2 positivity.   12/09/2019 -  Chemotherapy    Patient is on Treatment Plan: BREAST TRASTUZUMAB Q21D      12/28/2019 - 02/12/2020 Radiation Therapy   Adjuvant radiation   02/2020 -  Anti-estrogen oral therapy   Zoladex monthly until she underwent a BSO in 02/2020; Anastrozole started 02/2020     Observations/Objective:  There were no vitals filed for this visit. There is no height or weight on file to calculate BMI.  I have reviewed the data as listed CMP Latest Ref Rng & Units 07/27/2020 07/06/2020 06/18/2020  Glucose 70 - 99 mg/dL 76 89 103(H)  BUN 6 - 20 mg/dL 26(H) 19 20  Creatinine 0.44 - 1.00 mg/dL 0.72 0.70 0.45  Sodium 135 - 145 mmol/L 137 142 137  Potassium 3.5 - 5.1 mmol/L 5.2(H) 4.0 4.1  Chloride 98 - 111 mmol/L 102 105 103  CO2 22 - 32 mmol/L '29 30 26  ' Calcium 8.9 - 10.3 mg/dL 9.6 10.0 9.7  Total Protein 6.5 - 8.1 g/dL 7.0 7.2 -  Total Bilirubin 0.3 - 1.2 mg/dL 0.5 0.5 -  Alkaline Phos 38 - 126 U/L 74 77 -  AST 15 - 41 U/L 61(H) 33 -  ALT 0 - 44 U/L 56(H) 34 -    Lab Results  Component Value Date   WBC 2.7 (L) 07/27/2020   HGB 13.4  07/27/2020   HCT 39.6 07/27/2020   MCV 91.0 07/27/2020   PLT 226 07/27/2020   NEUTROABS 1.4 (L) 07/27/2020      Assessment Plan:  Malignant neoplasm of lower-inner quadrant of right breast of female, estrogen receptor positive ([place]) 05/15/2019:Patient palpated a right breast lump for two years. Diagnostic mammogram showed a 4.5cm right breast mass at the 6 o'clock position involving the overlying skin, 2 benign appearing left breast mass at the 10 o'clock position measuring 2.4cm and 2.1cm, and no axillary adenopathy. 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 Maintenance Herceptin subcutaneously every three weeks x 1 year starting 12/09/2019 3.Adjuvant radiation beginning 12/31/2019 4.Follow-up adjuvant antiestrogen therapyBSO scheduled 7/31, currently on Zoladex, to start Anastrozole mid 02/2020 ----------------------------------------------------------------------------------------------------------------------------------------------- 03/08/2020: Bilateral salpingo-oophorectomy  Herceptin:Will be completedMay 2022 Echo on 06/22/2020 EF 50-55%--followed by Dr. Haroldine Laws, to be  repeated in 6 weeks Weight loss:Prior gastric bypass surgery, stable Current treatment: Anastrozole and Herceptin  Anastrozole toxicities: 1.Hot flashes: Improved with Effexor now on 110m per day 2.fatigue: Marked improvement. 3.  Elevation of LFTs:  This will need to be monitored.  Potential cause could be anastrozole.  She cut down on alcohol use as well.  Depression: [place] requests a therapist. I gave her contact for [place] Insomnia issues: Currently on Valium  Breast cancer surveillance: Mammograms in 06/2020 normal, breast density D. MRI recommended and orders placed  Return to clinic every 3 weeks for Herceptin every 6 weeks for follow-up with me.     I discussed the assessment and treatment plan with the patient. The patient was provided an opportunity to ask questions and all were answered. The patient agreed with the plan and demonstrated an understanding of the instructions. The patient was advised to call back or seek an in-person evaluation if the symptoms worsen or if the condition fails to improve as anticipated.   Total time spent: 30 minutes including face-to-face MyChart video visit time and time spent for planning, charting and coordination of care  VRulon Eisenmenger MD 08/17/2020   I, MCloyde ReamsDorshimer, am acting as scribe for VNicholas Lose MD.  I have reviewed the above documentation for accuracy and completeness, and I agree with the above.

## 2020-08-17 ENCOUNTER — Inpatient Hospital Stay: Payer: No Typology Code available for payment source | Attending: Hematology and Oncology

## 2020-08-17 ENCOUNTER — Inpatient Hospital Stay: Payer: No Typology Code available for payment source

## 2020-08-17 ENCOUNTER — Inpatient Hospital Stay (HOSPITAL_BASED_OUTPATIENT_CLINIC_OR_DEPARTMENT_OTHER): Payer: No Typology Code available for payment source | Admitting: Hematology and Oncology

## 2020-08-17 ENCOUNTER — Other Ambulatory Visit: Payer: Self-pay

## 2020-08-17 VITALS — BP 114/83 | HR 84 | Temp 98.8°F | Resp 18 | Wt 114.0 lb

## 2020-08-17 DIAGNOSIS — Z170 Estrogen receptor positive status [ER+]: Secondary | ICD-10-CM

## 2020-08-17 DIAGNOSIS — C50311 Malignant neoplasm of lower-inner quadrant of right female breast: Secondary | ICD-10-CM

## 2020-08-17 DIAGNOSIS — C50211 Malignant neoplasm of upper-inner quadrant of right female breast: Secondary | ICD-10-CM | POA: Insufficient documentation

## 2020-08-17 DIAGNOSIS — Z5112 Encounter for antineoplastic immunotherapy: Secondary | ICD-10-CM | POA: Diagnosis not present

## 2020-08-17 LAB — CMP (CANCER CENTER ONLY)
ALT: 40 U/L (ref 0–44)
AST: 35 U/L (ref 15–41)
Albumin: 4.3 g/dL (ref 3.5–5.0)
Alkaline Phosphatase: 101 U/L (ref 38–126)
Anion gap: 6 (ref 5–15)
BUN: 19 mg/dL (ref 6–20)
CO2: 29 mmol/L (ref 22–32)
Calcium: 9.9 mg/dL (ref 8.9–10.3)
Chloride: 105 mmol/L (ref 98–111)
Creatinine: 0.69 mg/dL (ref 0.44–1.00)
GFR, Estimated: >60 mL/min (ref >60)
Glucose, Bld: 71 mg/dL (ref 70–99)
Potassium: 4.4 mmol/L (ref 3.5–5.1)
Sodium: 140 mmol/L (ref 135–145)
Total Bilirubin: 0.4 mg/dL (ref 0.3–1.2)
Total Protein: 7.6 g/dL (ref 6.5–8.1)

## 2020-08-17 LAB — CBC WITH DIFFERENTIAL (CANCER CENTER ONLY)
Abs Immature Granulocytes: 0 10*3/uL (ref 0.00–0.07)
Basophils Absolute: 0 10*3/uL (ref 0.0–0.1)
Basophils Relative: 1 %
Eosinophils Absolute: 0.1 10*3/uL (ref 0.0–0.5)
Eosinophils Relative: 3 %
HCT: 41.7 % (ref 36.0–46.0)
Hemoglobin: 14.2 g/dL (ref 12.0–15.0)
Immature Granulocytes: 0 %
Lymphocytes Relative: 31 %
Lymphs Abs: 0.9 10*3/uL (ref 0.7–4.0)
MCH: 30.9 pg (ref 26.0–34.0)
MCHC: 34.1 g/dL (ref 30.0–36.0)
MCV: 90.7 fL (ref 80.0–100.0)
Monocytes Absolute: 0.3 10*3/uL (ref 0.1–1.0)
Monocytes Relative: 11 %
Neutro Abs: 1.5 10*3/uL — ABNORMAL LOW (ref 1.7–7.7)
Neutrophils Relative %: 54 %
Platelet Count: 246 10*3/uL (ref 150–400)
RBC: 4.6 MIL/uL (ref 3.87–5.11)
RDW: 11.9 % (ref 11.5–15.5)
WBC Count: 2.8 10*3/uL — ABNORMAL LOW (ref 4.0–10.5)
nRBC: 0 % (ref 0.0–0.2)

## 2020-08-17 MED ORDER — DIPHENHYDRAMINE HCL 25 MG PO CAPS
ORAL_CAPSULE | ORAL | Status: AC
  Filled 2020-08-17: qty 2

## 2020-08-17 MED ORDER — TRASTUZUMAB-HYALURONIDASE-OYSK 600-10000 MG-UNT/5ML ~~LOC~~ SOLN
600.0000 mg | Freq: Once | SUBCUTANEOUS | Status: AC
  Administered 2020-08-17: 600 mg via SUBCUTANEOUS
  Filled 2020-08-17: qty 5

## 2020-08-17 MED ORDER — ACETAMINOPHEN 325 MG PO TABS
ORAL_TABLET | ORAL | Status: AC
  Filled 2020-08-17: qty 2

## 2020-08-17 MED ORDER — DIPHENHYDRAMINE HCL 25 MG PO CAPS
50.0000 mg | ORAL_CAPSULE | Freq: Once | ORAL | Status: AC
  Administered 2020-08-17: 50 mg via ORAL

## 2020-08-17 MED ORDER — ACETAMINOPHEN 325 MG PO TABS
650.0000 mg | ORAL_TABLET | Freq: Once | ORAL | Status: AC
  Administered 2020-08-17: 650 mg via ORAL

## 2020-08-17 NOTE — Progress Notes (Signed)
Nutrition Follow-up:  Patient with breast cancer.  Receiving herceptin.   Met with patient during infusion.  Patient reports that appetite is good. She has started eating yogurt again, enjoys cereal, beef roast with vegetables and rice, hot pockets.  Drinking 1 premier protein shake per day.  Had strep throat recently that effected appetite.    Denies any nutrition impact symptoms.   Medications: reviewed  Labs: reviewed  Anthropometrics:   Weight 113 lb 9.6 oz today stable from 113 lb on 11/17.    Noted 118 lb on 1/19   NUTRITION DIAGNOSIS: Unintentional weight loss stable   INTERVENTION:  Continue well balanced diet including good sources of protein Continue shakes for weight maintenance. Patient asking about exercise/weight training to build back muscle.  Encouraged patient to seek MD's recommendations.   Continue bariatric MVI and calcium supplementation.  Patient monitoring calcium labs. Contact information provided to patient and patient will reach out to RD if has further questions    NEXT VISIT: no follow-up RD available as needed  Allice Garro B. Zenia Resides, [place], [place] Registered Dietitian [phone] (mobile)

## 2020-08-17 NOTE — Patient Instructions (Signed)
[facility] Discharge Instructions for Patients Receiving Chemotherapy  Today you received the following chemotherapy agents herceptin hylecta   To help prevent nausea and vomiting after your treatment, we encourage you to take your nausea medication as directed.    If you develop nausea and vomiting that is not controlled by your nausea medication, call the clinic.   BELOW ARE SYMPTOMS THAT SHOULD BE REPORTED IMMEDIATELY:  *FEVER GREATER THAN 100.5 F  *CHILLS WITH OR WITHOUT FEVER  NAUSEA AND VOMITING THAT IS NOT CONTROLLED WITH YOUR NAUSEA MEDICATION  *UNUSUAL SHORTNESS OF BREATH  *UNUSUAL BRUISING OR BLEEDING  TENDERNESS IN MOUTH AND THROAT WITH OR WITHOUT PRESENCE OF ULCERS  *URINARY PROBLEMS  *BOWEL PROBLEMS  UNUSUAL RASH Items with * indicate a potential emergency and should be followed up as soon as possible.  Feel free to call the clinic should you have any questions or concerns. The clinic phone number is [phone].  Please show the CHEMO ALERT CARD at check-in to the Emergency Department and triage nurse.   

## 2020-08-17 NOTE — Assessment & Plan Note (Signed)
05/15/2019:Patient palpated a right breast lump for two years. Diagnostic mammogram showed a 4.5cm right breast mass at the 6 o'clock position involving the overlying skin, 2 benign appearing left breast mass at the 10 o'clock position measuring 2.4cm and 2.1cm, and no axillary adenopathy. 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 Maintenance Herceptin subcutaneously every three weeks x 1 year starting 12/09/2019 3.Adjuvant radiation beginning 12/31/2019 4.Follow-up adjuvant antiestrogen therapyBSO scheduled 7/31, currently on Zoladex, to start Anastrozole mid 02/2020 ----------------------------------------------------------------------------------------------------------------------------------------------- 03/08/2020: Bilateral salpingo-oophorectomy  Herceptin:Will be completedMay 2022 Echo on 06/22/2020 EF 50-55%--followed by Dr. Haroldine Laws, to be repeated in 6 weeks Weight loss:Prior gastric bypass surgery, stable Current treatment: Anastrozole and Herceptin  Anastrozole toxicities: 1.Hot flashes: Improved with Effexor now on 39m per day 2.fatigue: Marked improvement. 3.  Elevation of LFTs: This will need to be monitored.  Potential cause could be anastrozole.  She also drinks alcohol several times in the week. Having had prior gastric bypass surgery, she thinks that she gets tipsy very quickly.  Depression: Patient does not  yet have good relief from depression at 75 mg.  However she wants to wait another 3 more weeks to see if the increased dosage of Effexor is still going to help her.  If necessary we can go up on the dose to 150 mg.  Insomnia issues: Currently on Valium  Breast cancer surveillance:Mammograms in 06/2020 normal, breast density D. MRI recommended and orders placed  Return to clinic every 3 weeks for Herceptin every 6 weeks for follow-up with me.

## 2020-08-22 ENCOUNTER — Other Ambulatory Visit: Payer: Self-pay | Admitting: Hematology and Oncology

## 2020-08-22 NOTE — H&P (Signed)
Subjective:     Patient ID: Kimberly Mueller is a 35 y.o. female.  HPI  Here for follow up discussion prior to planned bilateral breast mastopexy, augmentation. Diagnosed right breast ca 2020. Completed neoadjuvant chemotherapy followed by right lumpectomy with SLN 11/2019 with final pathology 1.5 cm IDCwith grade 2-3 DCIS, 0/2 SLN, ER/PR+. Her2- on initial biopsy but positive on final surgical pathology. Completed adjuvant radiation 02/12/20. On Herceptin maintenance and anastrozole.  Since last visit had episode hematuria, diagnosed with nephrolithiasis. This has resolved.  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  Prior to gastric bypass and lumpectomy 38 C. Current wearing "medium" bra and notes asymmetry breasts, happy with right breast. Notes left "deflated."  Highest weight 230 lb. Underwent gastric bypass 04/2019 in [place], just prior to cancer diagnosis. Lowest weight 106 lb during chemotherapy, stable at current for several months.  MMG 06/22/2020 benign. MRI planned 6 month interval for high risk.  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 Spouse currently deployed with [employer] in [place], anticipated return home 11/2020. Has other family in area to assist with post op care  Review of Systems    Objective:   Physical Exam Cardiovascular:     Rate and Rhythm: Normal rate and regular rhythm.     Heart sounds: Normal heart sounds.  Pulmonary:     Breath sounds: Normal breath sounds.  Chest:  Breasts:     Right: No axillary adenopathy.     Left: No axillary adenopathy.    Lymphadenopathy:     Upper Body:     Right upper body: No axillary adenopathy.     Left upper body: No axillary adenopathy.  Skin:    Comments: Fitzpatrick 2     breast no masses Right breast transverse scar over mid lower pole with displacement NAC caudally and straight contour (not rounded) lower pole breast Right breast volume>left, no masses over right but indurated Soft tissue pinch  right 5 cm left 3 cm SN to nipple R 24 L 24 cm BW R 17 L 17 cm Nipple to IMF R 3 cm to lumpectomy scar, 6 cm to IMF L 8 cm     Assessment:     Right breast ca s/p right lumpectomy, SLN Adjuvant radiation Post operative asymmetry breasts    Plan:      Reviewed bilateral mastopexy incisions, anchor type scars. We have discussed this would offer more symmetry NAC position and shape breasts. However has significant volume asymmetry RT. One option to reduce volume over right to match left. She has minimal overall breast volume over left.  Reports she is generally satisfied with right breast volume, desires larger on left. Plan bilateral augmentation with mastopexy. Reviewed risks of implants including rupture, need for additional surgery, displacement and capsular contracture. Implant displacement higher risk for her given massive weight loss and loss elasticity soft tissue. Reviewed increased risks contracture and wound healing problems including extrusion or exposure implant in setting RT. Given her soft tissue pinch on left recommend sub pectoral placement on left. Discussed over right chest again RT to pectoralis creates fibrosis and will not expand/stretch around implant as it will on left.    Her asymmetry breast volume may lead to different volume implants used. Counseled overall many moving parts and though goal of procedure to offer more symmetry, there is definite risk asymmetry outcome and need for revision surgery. Patient planning a move later this year. Long term I anticipate she will need additional procedures  and have counseled she should consider delaying surgery until settled in new location. Desires to proceed with surgery now.   Reviewed saline vs silicone. Discussed  HCG or capacity filled silicone implants may offer less visible rippling. Reviewed MRI or USsurveillance for rupture with silicone implants.Reviewed for 4th generation and HCG implants vs saline implants.  Reviewed implants are not permanent devices, may require additional surgery and reviewed risk rupture.Discussed smooth vs textured implants. reviewed purpose of texturing, reviewed risk BIA-ALCL with textured devices. Patient asked if implant malposition or displacement was a higher risk than BIA-ALCL for her and counseled yes that asymmetry/implant displacement was likely higher risk than BIA-ALCL. Counseled patient that if she chooses smooth implants at this time, and if she develops implant displacement, can always consider switch to textured device or use of mesh or scaffolds to address in future. Provided ASPS FAQs on BIA-ALCL. Reviewed common presentation of this as seroma, that removal textured implants in future does not remove risk of BIA-ALCL. Patient has elected for textured silicone implants, plan round micotextured devices.  Additional risks including but not limited to infection, bleeding, need for additional surgery, damage to adjacent structures, hematoma, seroma, blood clots in legs or lungs, unacceptable cosmetic result reviewed.  Provided Mentor MemoryGel Reconstruction implant information booklet and completed physician patient checklist.  Completed in office sizing and liked 150 ml over right, 250 ml over left.  Rx for oxycodone, robaxin, and bactrim given.  Discussed risk COVID infectionthrough this elective surgery. Patient will receive COVID testing prior to surgery. Discussed even if patient receivesa negative test result, the tests in some cases may fail to detect the virus or patient maycontract COVID after the test.COVID 19 infectionbefore/during/aftersurgery may result in lead to a higher chance of complication and death.

## 2020-08-29 ENCOUNTER — Other Ambulatory Visit: Payer: Self-pay

## 2020-08-29 ENCOUNTER — Encounter (HOSPITAL_BASED_OUTPATIENT_CLINIC_OR_DEPARTMENT_OTHER): Payer: Self-pay | Admitting: Plastic Surgery

## 2020-09-01 ENCOUNTER — Other Ambulatory Visit
Admission: RE | Admit: 2020-09-01 | Discharge: 2020-09-01 | Disposition: A | Discharge status: Home / Self Care | Payer: No Typology Code available for payment source | Source: Ambulatory Visit | Attending: Plastic Surgery | Admitting: Plastic Surgery

## 2020-09-01 DIAGNOSIS — Z20822 Contact with and (suspected) exposure to covid-19: Secondary | ICD-10-CM | POA: Diagnosis not present

## 2020-09-01 DIAGNOSIS — Z01812 Encounter for preprocedural laboratory examination: Secondary | ICD-10-CM | POA: Insufficient documentation

## 2020-09-02 ENCOUNTER — Other Ambulatory Visit: Payer: Self-pay

## 2020-09-02 LAB — SARS CORONAVIRUS 2 (TAT 6-24 HRS): SARS Coronavirus 2: NEGATIVE

## 2020-09-04 NOTE — Anesthesia Preprocedure Evaluation (Addendum)
Anesthesia Evaluation  Patient identified by MRN, date of birth, ID band Patient awake    Reviewed: Allergy & Precautions, NPO status , Patient's Chart, lab work & pertinent test results  Airway Mallampati: II  TM Distance: >3 FB Neck ROM: Full    Dental no notable dental hx.    Pulmonary former smoker,    Pulmonary exam normal breath sounds clear to auscultation       Cardiovascular negative cardio ROS Normal cardiovascular exam Rhythm:Regular Rate:Normal     Neuro/Psych PSYCHIATRIC DISORDERS Anxiety Depression negative neurological ROS     GI/Hepatic Neg liver ROS, GERD  Medicated and Controlled,  Endo/Other  negative endocrine ROS  Renal/GU negative Renal ROS     Musculoskeletal negative musculoskeletal ROS (+)   Abdominal   Peds  Hematology negative hematology ROS (+)   Anesthesia Other Findings History breast cancer, history therapeutic radiation, post operative asymmetry breasts  Reproductive/Obstetrics S/p BSO                           Anesthesia Physical Anesthesia Plan  ASA: II  Anesthesia Plan: General   Post-op Pain Management:    Induction: Intravenous  PONV Risk Score and Plan: 3 and Ondansetron, Dexamethasone, Midazolam, Amisulpride and Treatment may vary due to age or medical condition  Airway Management Planned: LMA  Additional Equipment:   Intra-op Plan:   Post-operative Plan: Extubation in OR  Informed Consent: I have reviewed the patients History and Physical, chart, labs and discussed the procedure including the risks, benefits and alternatives for the proposed anesthesia with the patient or authorized representative who has indicated his/her understanding and acceptance.     Dental advisory given  Plan Discussed with: CRNA  Anesthesia Plan Comments:        Anesthesia Quick Evaluation

## 2020-09-05 ENCOUNTER — Encounter (HOSPITAL_BASED_OUTPATIENT_CLINIC_OR_DEPARTMENT_OTHER): Payer: Self-pay | Admitting: Plastic Surgery

## 2020-09-05 ENCOUNTER — Ambulatory Visit (HOSPITAL_BASED_OUTPATIENT_CLINIC_OR_DEPARTMENT_OTHER): Payer: No Typology Code available for payment source | Admitting: Anesthesiology

## 2020-09-05 ENCOUNTER — Other Ambulatory Visit: Payer: Self-pay

## 2020-09-05 ENCOUNTER — Ambulatory Visit (HOSPITAL_COMMUNITY): Payer: No Typology Code available for payment source

## 2020-09-05 ENCOUNTER — Ambulatory Visit (HOSPITAL_BASED_OUTPATIENT_CLINIC_OR_DEPARTMENT_OTHER)
Admission: RE | Admit: 2020-09-05 | Discharge: 2020-09-05 | Disposition: A | Discharge status: Home / Self Care | Payer: No Typology Code available for payment source | Attending: Plastic Surgery | Admitting: Plastic Surgery

## 2020-09-05 ENCOUNTER — Encounter (HOSPITAL_COMMUNITY): Payer: No Typology Code available for payment source | Admitting: Internal Medicine

## 2020-09-05 ENCOUNTER — Encounter (HOSPITAL_BASED_OUTPATIENT_CLINIC_OR_DEPARTMENT_OTHER)
Admission: RE | Disposition: A | Discharge status: Home / Self Care | Payer: Self-pay | Source: Home / Self Care | Attending: Plastic Surgery

## 2020-09-05 DIAGNOSIS — Z9884 Bariatric surgery status: Secondary | ICD-10-CM | POA: Insufficient documentation

## 2020-09-05 DIAGNOSIS — Z923 Personal history of irradiation: Secondary | ICD-10-CM | POA: Diagnosis not present

## 2020-09-05 DIAGNOSIS — Z87891 Personal history of nicotine dependence: Secondary | ICD-10-CM | POA: Diagnosis not present

## 2020-09-05 DIAGNOSIS — C50911 Malignant neoplasm of unspecified site of right female breast: Secondary | ICD-10-CM | POA: Insufficient documentation

## 2020-09-05 DIAGNOSIS — Z170 Estrogen receptor positive status [ER+]: Secondary | ICD-10-CM | POA: Insufficient documentation

## 2020-09-05 DIAGNOSIS — I890 Lymphedema, not elsewhere classified: Secondary | ICD-10-CM | POA: Insufficient documentation

## 2020-09-05 DIAGNOSIS — N6489 Other specified disorders of breast: Secondary | ICD-10-CM | POA: Diagnosis not present

## 2020-09-05 HISTORY — PX: BREAST ENHANCEMENT SURGERY: SHX7

## 2020-09-05 HISTORY — PX: MASTOPEXY: SHX5358

## 2020-09-05 SURGERY — MASTOPEXY
Anesthesia: General | Site: Breast | Laterality: Bilateral

## 2020-09-05 MED ORDER — CEFAZOLIN SODIUM-DEXTROSE 2-4 GM/100ML-% IV SOLN
2.0000 g | INTRAVENOUS | Status: AC
  Administered 2020-09-05: 2 g via INTRAVENOUS

## 2020-09-05 MED ORDER — BUPIVACAINE HCL (PF) 0.5 % IJ SOLN
INTRAMUSCULAR | Status: DC | PRN
  Administered 2020-09-05: 30 mL

## 2020-09-05 MED ORDER — GABAPENTIN 300 MG PO CAPS
ORAL_CAPSULE | ORAL | Status: AC
  Filled 2020-09-05: qty 1

## 2020-09-05 MED ORDER — CHLORHEXIDINE GLUCONATE CLOTH 2 % EX PADS: 6.0000 | MEDICATED_PAD | Freq: Once | CUTANEOUS | Status: DC

## 2020-09-05 MED ORDER — ROCURONIUM BROMIDE 100 MG/10ML IV SOLN
INTRAVENOUS | Status: DC | PRN
  Administered 2020-09-05: 40 mg via INTRAVENOUS
  Administered 2020-09-05 (×4): 20 mg via INTRAVENOUS

## 2020-09-05 MED ORDER — FENTANYL CITRATE (PF) 100 MCG/2ML IJ SOLN
25.0000 ug | INTRAMUSCULAR | Status: DC | PRN
  Administered 2020-09-05 (×2): 25 ug via INTRAVENOUS

## 2020-09-05 MED ORDER — CELECOXIB 200 MG PO CAPS
ORAL_CAPSULE | ORAL | Status: AC
  Filled 2020-09-05: qty 1

## 2020-09-05 MED ORDER — EPHEDRINE SULFATE 50 MG/ML IJ SOLN
INTRAMUSCULAR | Status: DC | PRN
  Administered 2020-09-05 (×2): 15 mg via INTRAVENOUS

## 2020-09-05 MED ORDER — FENTANYL CITRATE (PF) 100 MCG/2ML IJ SOLN
INTRAMUSCULAR | Status: AC
  Filled 2020-09-05: qty 2

## 2020-09-05 MED ORDER — FENTANYL CITRATE (PF) 100 MCG/2ML IJ SOLN
INTRAMUSCULAR | Status: DC | PRN
  Administered 2020-09-05: 50 ug via INTRAVENOUS
  Administered 2020-09-05: 25 ug via INTRAVENOUS
  Administered 2020-09-05 (×2): 50 ug via INTRAVENOUS
  Administered 2020-09-05: 25 ug via INTRAVENOUS

## 2020-09-05 MED ORDER — LIDOCAINE HCL (CARDIAC) PF 100 MG/5ML IV SOSY
PREFILLED_SYRINGE | INTRAVENOUS | Status: DC | PRN
  Administered 2020-09-05: 60 mg via INTRAVENOUS

## 2020-09-05 MED ORDER — ONDANSETRON HCL 4 MG/2ML IJ SOLN
INTRAMUSCULAR | Status: DC | PRN
  Administered 2020-09-05: 4 mg via INTRAVENOUS

## 2020-09-05 MED ORDER — MIDAZOLAM HCL 5 MG/5ML IJ SOLN
INTRAMUSCULAR | Status: DC | PRN
  Administered 2020-09-05 (×2): 1 mg via INTRAVENOUS

## 2020-09-05 MED ORDER — ONDANSETRON HCL 4 MG/2ML IJ SOLN
INTRAMUSCULAR | Status: AC
  Filled 2020-09-05: qty 2

## 2020-09-05 MED ORDER — ACETAMINOPHEN 500 MG PO TABS
1000.0000 mg | ORAL_TABLET | ORAL | Status: AC
  Administered 2020-09-05: 1000 mg via ORAL

## 2020-09-05 MED ORDER — EPHEDRINE 5 MG/ML INJ
INTRAVENOUS | Status: AC
  Filled 2020-09-05: qty 10

## 2020-09-05 MED ORDER — PROMETHAZINE HCL 25 MG/ML IJ SOLN: 6.2500 mg | INTRAMUSCULAR | Status: DC | PRN

## 2020-09-05 MED ORDER — LIDOCAINE 2% (20 MG/ML) 5 ML SYRINGE
INTRAMUSCULAR | Status: AC
  Filled 2020-09-05: qty 5

## 2020-09-05 MED ORDER — AMISULPRIDE (ANTIEMETIC) 5 MG/2ML IV SOLN: 10.0000 mg | Freq: Once | INTRAVENOUS | Status: DC | PRN

## 2020-09-05 MED ORDER — ROCURONIUM BROMIDE 10 MG/ML (PF) SYRINGE
PREFILLED_SYRINGE | INTRAVENOUS | Status: AC
  Filled 2020-09-05: qty 10

## 2020-09-05 MED ORDER — DEXAMETHASONE SODIUM PHOSPHATE 4 MG/ML IJ SOLN
INTRAMUSCULAR | Status: DC | PRN
  Administered 2020-09-05: 8 mg via INTRAVENOUS

## 2020-09-05 MED ORDER — ACETAMINOPHEN 500 MG PO TABS
ORAL_TABLET | ORAL | Status: AC
  Filled 2020-09-05: qty 2

## 2020-09-05 MED ORDER — SODIUM CHLORIDE 0.9 % IV SOLN
INTRAVENOUS | Status: DC | PRN
  Administered 2020-09-05: 500 mL

## 2020-09-05 MED ORDER — DEXAMETHASONE SODIUM PHOSPHATE 10 MG/ML IJ SOLN
INTRAMUSCULAR | Status: AC
  Filled 2020-09-05: qty 1

## 2020-09-05 MED ORDER — ARTIFICIAL TEARS OPHTHALMIC OINT
TOPICAL_OINTMENT | OPHTHALMIC | Status: AC
  Filled 2020-09-05: qty 3.5

## 2020-09-05 MED ORDER — OXYCODONE HCL 5 MG PO TABS
ORAL_TABLET | ORAL | Status: AC
  Filled 2020-09-05: qty 1

## 2020-09-05 MED ORDER — CEFAZOLIN SODIUM-DEXTROSE 2-4 GM/100ML-% IV SOLN
INTRAVENOUS | Status: AC
  Filled 2020-09-05: qty 100

## 2020-09-05 MED ORDER — GABAPENTIN 300 MG PO CAPS
300.0000 mg | ORAL_CAPSULE | ORAL | Status: AC
  Administered 2020-09-05: 300 mg via ORAL

## 2020-09-05 MED ORDER — SODIUM CHLORIDE 0.9 % IV SOLN
INTRAVENOUS | Status: AC
  Filled 2020-09-05: qty 10

## 2020-09-05 MED ORDER — OXYCODONE HCL 5 MG PO TABS
5.0000 mg | ORAL_TABLET | Freq: Once | ORAL | Status: AC | PRN
  Administered 2020-09-05: 5 mg via ORAL

## 2020-09-05 MED ORDER — PROPOFOL 10 MG/ML IV BOLUS
INTRAVENOUS | Status: AC
  Filled 2020-09-05: qty 20

## 2020-09-05 MED ORDER — PROPOFOL 10 MG/ML IV BOLUS
INTRAVENOUS | Status: DC | PRN
  Administered 2020-09-05: 200 mg via INTRAVENOUS

## 2020-09-05 MED ORDER — SUGAMMADEX SODIUM 200 MG/2ML IV SOLN
INTRAVENOUS | Status: DC | PRN
  Administered 2020-09-05: 200 mg via INTRAVENOUS

## 2020-09-05 MED ORDER — MIDAZOLAM HCL 2 MG/2ML IJ SOLN
INTRAMUSCULAR | Status: AC
  Filled 2020-09-05: qty 2

## 2020-09-05 MED ORDER — LACTATED RINGERS IV SOLN: INTRAVENOUS | Status: DC

## 2020-09-05 MED ORDER — BUPIVACAINE HCL (PF) 0.5 % IJ SOLN
INTRAMUSCULAR | Status: AC
  Filled 2020-09-05: qty 30

## 2020-09-05 MED ORDER — POVIDONE-IODINE 10 % EX SOLN
CUTANEOUS | Status: DC | PRN
  Administered 2020-09-05: 1 via TOPICAL

## 2020-09-05 MED ORDER — 0.9 % SODIUM CHLORIDE (POUR BTL) OPTIME
TOPICAL | Status: DC | PRN
  Administered 2020-09-05: 500 mL

## 2020-09-05 MED ORDER — CELECOXIB 200 MG PO CAPS
200.0000 mg | ORAL_CAPSULE | ORAL | Status: AC
  Administered 2020-09-05: 200 mg via ORAL

## 2020-09-05 MED ORDER — OXYCODONE HCL 5 MG/5ML PO SOLN: 5.0000 mg | Freq: Once | ORAL | Status: AC | PRN

## 2020-09-05 SURGICAL SUPPLY — 82 items
ADH SKN CLS APL DERMABOND .7 (GAUZE/BANDAGES/DRESSINGS) ×2
APL PRP STRL LF DISP 70% ISPRP (MISCELLANEOUS) ×2
BAG DECANTER FOR FLEXI CONT (MISCELLANEOUS) ×2 IMPLANT
BINDER BREAST LRG (GAUZE/BANDAGES/DRESSINGS) ×2 IMPLANT
BINDER BREAST MEDIUM (GAUZE/BANDAGES/DRESSINGS) IMPLANT
BINDER BREAST XLRG (GAUZE/BANDAGES/DRESSINGS) IMPLANT
BINDER BREAST XXLRG (GAUZE/BANDAGES/DRESSINGS) IMPLANT
BLADE SURG 10 STRL SS (BLADE) ×4 IMPLANT
BLADE SURG 15 STRL LF DISP TIS (BLADE) IMPLANT
BLADE SURG 15 STRL SS (BLADE)
BNDG ELASTIC 6X5.8 VLCR STR LF (GAUZE/BANDAGES/DRESSINGS) IMPLANT
BNDG GAUZE ELAST 4 BULKY (GAUZE/BANDAGES/DRESSINGS) ×4 IMPLANT
BREAST IMPLANT GEL PP 160CC (Breast) ×2 IMPLANT
BREAST IMPLANT GEL PP 270CC (Breast) ×2 IMPLANT
CANISTER SUCT 1200ML W/VALVE (MISCELLANEOUS) ×2 IMPLANT
CHLORAPREP W/TINT 26 (MISCELLANEOUS) ×4 IMPLANT
COVER BACK TABLE 60X90IN (DRAPES) ×2 IMPLANT
COVER MAYO STAND STRL (DRAPES) ×2 IMPLANT
COVER WAND RF STERILE (DRAPES) IMPLANT
DECANTER SPIKE VIAL GLASS SM (MISCELLANEOUS) IMPLANT
DERMABOND ADVANCED (GAUZE/BANDAGES/DRESSINGS) ×2
DERMABOND ADVANCED .7 DNX12 (GAUZE/BANDAGES/DRESSINGS) ×2 IMPLANT
DRAIN CHANNEL 15F RND FF W/TCR (WOUND CARE) ×4 IMPLANT
DRAIN CHANNEL 19F RND (DRAIN) IMPLANT
DRAPE TOP ARMCOVERS (MISCELLANEOUS) ×2 IMPLANT
DRAPE U-SHAPE 76X120 STRL (DRAPES) ×2 IMPLANT
DRAPE UTILITY XL STRL (DRAPES) ×2 IMPLANT
DRSG PAD ABDOMINAL 8X10 ST (GAUZE/BANDAGES/DRESSINGS) ×4 IMPLANT
DRSG TEGADERM 2-3/8X2-3/4 SM (GAUZE/BANDAGES/DRESSINGS) IMPLANT
ELECT BLADE 4.0 EZ CLEAN MEGAD (MISCELLANEOUS)
ELECT BLADE 6.5 EXT (BLADE) IMPLANT
ELECT COATED BLADE 2.86 ST (ELECTRODE) ×2 IMPLANT
ELECT REM PT RETURN 9FT ADLT (ELECTROSURGICAL) ×2
ELECTRODE BLDE 4.0 EZ CLN MEGD (MISCELLANEOUS) IMPLANT
ELECTRODE REM PT RTRN 9FT ADLT (ELECTROSURGICAL) ×1 IMPLANT
EVACUATOR SILICONE 100CC (DRAIN) ×4 IMPLANT
FUNNEL KELLER 2 DISP (MISCELLANEOUS) ×2 IMPLANT
GAUZE SPONGE 4X4 12PLY STRL (GAUZE/BANDAGES/DRESSINGS) IMPLANT
GAUZE SPONGE 4X4 12PLY STRL LF (GAUZE/BANDAGES/DRESSINGS) IMPLANT
GLOVE SURG HYDRASOFT LTX SZ5.5 (GLOVE) ×6 IMPLANT
GOWN STRL REUS W/ TWL LRG LVL3 (GOWN DISPOSABLE) ×2 IMPLANT
GOWN STRL REUS W/TWL LRG LVL3 (GOWN DISPOSABLE) ×4
IMPL BREAST GEL PP 160CC (Breast) ×1 IMPLANT
IMPL BREAST GEL PP 270CC (Breast) ×1 IMPLANT
IV NS 1000ML (IV SOLUTION)
IV NS 1000ML BAXH (IV SOLUTION) IMPLANT
IV NS 500ML (IV SOLUTION)
IV NS 500ML BAXH (IV SOLUTION) IMPLANT
KIT FILL SYSTEM UNIVERSAL (SET/KITS/TRAYS/PACK) IMPLANT
MARKER SKIN DUAL TIP RULER LAB (MISCELLANEOUS) IMPLANT
NEEDLE HYPO 25X1 1.5 SAFETY (NEEDLE) ×2 IMPLANT
NS IRRIG 1000ML POUR BTL (IV SOLUTION) ×2 IMPLANT
PACK BASIN DAY SURGERY FS (CUSTOM PROCEDURE TRAY) ×2 IMPLANT
PENCIL SMOKE EVACUATOR (MISCELLANEOUS) ×2 IMPLANT
PIN SAFETY STERILE (MISCELLANEOUS) IMPLANT
SHEET MEDIUM DRAPE 40X70 STRL (DRAPES) ×4 IMPLANT
SIZER BREAST 270CC (SIZER) ×2
SIZER BREAST REUSABLE 160CC (SIZER) ×2
SIZER BREAST REUSABLE 240CC (SIZER) ×2
SIZER BRST 270CC (SIZER) ×1 IMPLANT
SIZER BRST REUSABLE 160CC (SIZER) ×1 IMPLANT
SIZER BRST REUSABLE 240CC (SIZER) ×1 IMPLANT
SLEEVE SCD COMPRESS KNEE MED (STOCKING) ×2 IMPLANT
SPONGE LAP 18X18 RF (DISPOSABLE) ×4 IMPLANT
STAPLER VISISTAT 35W (STAPLE) ×2 IMPLANT
STRIP CLOSURE SKIN 1/2X4 (GAUZE/BANDAGES/DRESSINGS) IMPLANT
SUT ETHILON 2 0 FS 18 (SUTURE) ×2 IMPLANT
SUT MNCRL AB 4-0 PS2 18 (SUTURE) ×8 IMPLANT
SUT PDS AB 2-0 CT2 27 (SUTURE) ×4 IMPLANT
SUT VIC AB 3-0 PS1 18 (SUTURE) ×4
SUT VIC AB 3-0 PS1 18XBRD (SUTURE) ×2 IMPLANT
SUT VIC AB 3-0 SH 27 (SUTURE) ×4
SUT VIC AB 3-0 SH 27X BRD (SUTURE) ×2 IMPLANT
SUT VICRYL 4-0 PS2 18IN ABS (SUTURE) ×4 IMPLANT
SYR 50ML LL SCALE MARK (SYRINGE) IMPLANT
SYR BULB IRRIG 60ML STRL (SYRINGE) ×2 IMPLANT
SYR CONTROL 10ML LL (SYRINGE) ×2 IMPLANT
TAPE MEASURE VINYL STERILE (MISCELLANEOUS) IMPLANT
TOWEL GREEN STERILE FF (TOWEL DISPOSABLE) ×2 IMPLANT
TUBE CONNECTING 20X1/4 (TUBING) ×4 IMPLANT
UNDERPAD 30X36 HEAVY ABSORB (UNDERPADS AND DIAPERS) ×4 IMPLANT
YANKAUER SUCT BULB TIP NO VENT (SUCTIONS) ×2 IMPLANT

## 2020-09-05 NOTE — Interval H&P Note (Signed)
History and Physical Interval Note:  09/05/2020 7:02 AM  Kimberly Mueller  has presented today for surgery, with the diagnosis of history breast cancer, history therapeutic radiation, post operative asymmetry breasts.  The various methods of treatment have been discussed with the patient and family. After consideration of risks, benefits and other options for treatment, the patient has consented to  Procedure(s): MASTOPEXY (Bilateral) MAMMOPLASTY AUGMENTATION (BREAST) WITH SILICONE IMPLANTS (Bilateral) as a surgical intervention.  The patient's history has been reviewed, patient examined, no change in status, stable for surgery.  I have reviewed the patient's chart and labs.  Questions were answered to the patient's satisfaction.     Arnoldo Hooker Pesach Frisch

## 2020-09-05 NOTE — Transfer of Care (Signed)
Immediate Anesthesia Transfer of Care Note  Patient: Kimberly Mueller  Procedure(s) Performed: MASTOPEXY (Bilateral Breast) MAMMOPLASTY AUGMENTATION (BREAST) WITH SILICONE IMPLANTS (Bilateral Breast)  Patient Location: PACU  Anesthesia Type:General  Level of Consciousness: awake, alert  and oriented  Airway & Oxygen Therapy: Patient Spontanous Breathing and Patient connected to nasal cannula oxygen  Post-op Assessment: Report given to RN and Post -op Vital signs reviewed and stable  Post vital signs: Reviewed and stable  Last Vitals:  Vitals Value Taken Time  BP 116/73 09/05/20 1122  Temp    Pulse 93 09/05/20 1125  Resp 16 09/05/20 1125  SpO2 100 % 09/05/20 1125  Vitals shown include unvalidated device data.  Last Pain:  Vitals:   09/05/20 0652  TempSrc: Oral  PainSc: 0-No pain         Complications: No complications documented.

## 2020-09-05 NOTE — Discharge Instructions (Signed)

## 2020-09-05 NOTE — Anesthesia Procedure Notes (Signed)
Procedure Name: Intubation Date/Time: 09/05/2020 7:34 AM Performed by: Bufford Spikes, CRNA Pre-anesthesia Checklist: Patient identified, Emergency Drugs available, Suction available and Patient being monitored Patient Re-evaluated:Patient Re-evaluated prior to induction Oxygen Delivery Method: Circle system utilized Preoxygenation: Pre-oxygenation with 100% oxygen Induction Type: IV induction Ventilation: Mask ventilation without difficulty Tube type: Oral Tube size: 6.5 mm Number of attempts: 1 Airway Equipment and Method: Stylet and Oral airway Placement Confirmation: ETT inserted through vocal cords under direct vision,  positive ETCO2 and breath sounds checked- equal and bilateral Secured at: 21 cm Tube secured with: Tape Dental Injury: Teeth and Oropharynx as per pre-operative assessment

## 2020-09-05 NOTE — Anesthesia Postprocedure Evaluation (Signed)
Anesthesia Post Note  Patient: Kimberly Mueller  Procedure(s) Performed: MASTOPEXY (Bilateral Breast) MAMMOPLASTY AUGMENTATION (BREAST) WITH SILICONE IMPLANTS (Bilateral Breast)     Patient location during evaluation: PACU Anesthesia Type: General Level of consciousness: awake Pain management: pain level controlled Vital Signs Assessment: post-procedure vital signs reviewed and stable Respiratory status: spontaneous breathing, nonlabored ventilation, respiratory function stable and patient connected to nasal cannula oxygen Cardiovascular status: blood pressure returned to baseline and stable Postop Assessment: no apparent nausea or vomiting Anesthetic complications: no   No complications documented.  Last Vitals:  Vitals:   09/05/20 1200 09/05/20 1211  BP: 118/74   Pulse: 91 88  Resp: 17 18  Temp:    SpO2: 100% 100%    Last Pain:  Vitals:   09/05/20 1215  TempSrc:   PainSc: 7                  Diago Haik P Brinsley Wence

## 2020-09-05 NOTE — Op Note (Signed)
Operative Note   DATE OF OPERATION: 2.28.22  LOCATION: [facility]-outpatient  SURGICAL DIVISION: Plastic Surgery  PREOPERATIVE DIAGNOSES:  1. History breast cancer 2. History therapeutic radiation 3. Acquired asymmetry breasts 4. History bariatric surgery  POSTOPERATIVE DIAGNOSES:  same  PROCEDURE:  1. Bilateral breast augmentation with silicone implants 2. Bilateral breast mastopexy  SURGEON: Irene Limbo MD MBA  ASSISTANT: none  ANESTHESIA:  General.   EBL: 202 ml  COMPLICATIONS: None immediate.   INDICATIONS FOR PROCEDURE:  The patient, Kimberly Mueller, is a 35 y.o. female born on 1986-02-03, is here for breast reconstruction following right lumpectomy and adjuvant radiation with resulting asymmetry breasts. Patient also has a history of massive weight loss. She presents with right breast larger volume and breast lymphedema present, tethered lower pole scar. Patient has elected for textured implants.   FINDINGS: Mentor Xtra Siltex Moderate Plus Profile implants placed bilateral. RIGHT 160 ml REF TMPX-160 SN [id number] LEFT 270 ml REF TMPX-270 SN [id number]  DESCRIPTION OF PROCEDURE:  The patient was marked standing in the preoperative area including chest midline, breast meridians, anterior axillary lines, inframammary folds. Tentative placement NAC marked over anterior surface breast by palpation at [facility]. Vertical pillars for mastopexy tentatively marked by displacement breast against meridian. The patient was taken to the operating room. SCDs were placed and IV antibiotics were given. The patient's operative site was prepped and draped in a sterile fashion. A time out was performed and all information was confirmed to be correct.   Over left breast, NAC marked on stretch with 42 mm marker. Incision made over lower pole in area of anticipated skin resection and carried to lateral border pectoralis muscle. Submuscular dissection completed to anticipated dimensions  implant. Inframammary fold stabilized with interrupted 2-0 PDS suture from superficial fascia to chest wall. Sizer placed. Skin tailor tacked closed.  Over right breast, NAC marked on stretch with 42 mm marker. Incision made over lower pole in area of anticipated skin resection and carried to lateral border pectoralis muscle. Submuscular dissection completed. Inframammary fold stabilized with interrupted 2-0 PDS suture from superficial fascia to chest wall. Sizer placed. Skin tailor tacked closed.  Patient brought to upright sitting position. A 270 ml moderate plus profile implant selected for left breast and a 160 ml moderate plus profile implant selected for right breast. Remainder mastopexy marking completed by tailor tacking of soft tissue. Location for nipple areolar complex marked symmetric from sternal notch and 38 mm diameter marker utilized. Patient returned to supine position.   Over left breast, skin in area marked by tailor tacking deepithelialized. Superior pedicle developed. Cavity irrigated with saline solution containing Ancef, gentamicin, and Betadine. Hemostasis ensured. Local anesthetic infiltrated. 15 Fr JP placed in cavity and secured to skin with 2-0 nylon. With aid Keller funnel implant placed in submuscular position. 2-0 PDS suture used to close breast tissue pillars over implant over lower pole. Closure completed with 3-0 vicryl interrupted in dermis along vertical limb and inframammary fold. 4-0 vicryl used to inset NAC interrupted dermal sutures. Skin closure completed throughout with 4-0 monocryl subcuticular.  Over right breast, skin in area marked by tailor tacking deepithelialized. Superior pedicle developed. Cavity irrigated with saline solution containing Ancef, gentamicin, and Betadine. Hemostasis ensured. Local anesthetic infiltrated. 15 Fr JP placed in cavity and secured to skin with 2-0 nylon. With aid Keller funnel implant placed in submuscular position. 2-0 PDS suture  used to close breast tissue pillars over implant over lower pole. Closure completed with 3-0 vicryl interrupted  in dermis along vertical limb and inframammary fold. 4-0 vicryl used to inset NAC interrupted dermal sutures. Skin closure completed throughout with 4-0 monocryll subcuticular.  Dermabond applied to all incisions followed by dry dressing, breast binder.The patient was allowed to wake from anesthesia, extubated and taken to the recovery room in satisfactory condition.   SPECIMENS: right and left mastopexy  DRAINS: 15 Fr JP in right and left breast

## 2020-09-06 ENCOUNTER — Encounter (HOSPITAL_BASED_OUTPATIENT_CLINIC_OR_DEPARTMENT_OTHER): Payer: Self-pay | Admitting: Plastic Surgery

## 2020-09-06 LAB — SURGICAL PATHOLOGY

## 2020-09-07 ENCOUNTER — Ambulatory Visit: Payer: No Typology Code available for payment source

## 2020-09-27 NOTE — Assessment & Plan Note (Signed)
05/15/2019:Patient palpated a right breast lump for two years. Diagnostic mammogram showed a 4.5cm right breast mass at the 6 o'clock position involving the overlying skin, 2 benign appearing left breast mass at the 10 o'clock position measuring 2.4cm and 2.1cm, and no axillary adenopathy. 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 Maintenance Herceptin subcutaneously every three weeks x 1 year starting 12/09/2019 3.Adjuvant radiation beginning 12/31/2019 4.Follow-up adjuvant antiestrogen therapyBSO scheduled 7/31, currently on Zoladex, to start Anastrozole mid 02/2020 ----------------------------------------------------------------------------------------------------------------------------------------------- 03/08/2020: Bilateral salpingo-oophorectomy  Herceptin:Will be completedMay 2022 Echo on 06/22/2020 EF 50-55%--followed by Dr. Bensimhon, to be repeated in 6 weeks Weight loss:Prior gastric bypass surgery, stable Current treatment: Anastrozole and Herceptin  Anastrozole toxicities: 1.Hot flashes: Improved with Effexornow on75mg per day 2.fatigue:Marked improvement. 3.Elevation of LFTs: This will need to be monitored. Potential cause could be anastrozole. She cut down on alcohol use as well.  Depression: Betterbut requests a therapist. I gave her contact for Dr.Kaur. Insomnia issues: Currently on Valium  Breast  cancer surveillance: Mammograms in 06/2020 normal, breast density D. MRI recommended and orders placed  09/05/20: Bilateral breast augmentation with silicone implants 2. Bilateral breast mastopexy Return to clinic every 3 weeks for Herceptin every 6 weeks for follow-up with me.  

## 2020-09-27 NOTE — Progress Notes (Signed)
Patient Care Team: Jilda Panda, MD as PCP - General (Internal Medicine) Nicholas Lose, MD as Consulting Physician (Hematology and Oncology) Rolm Bookbinder, MD as Consulting Physician (General Surgery) Gery Pray, MD as Consulting Physician (Radiation Oncology) Rockwell Germany, RN as Oncology Nurse Navigator Mauro Kaufmann, RN as Oncology Nurse Navigator  DIAGNOSIS:    ICD-10-CM   1. Malignant neoplasm of lower-inner quadrant of right breast of female, estrogen receptor positive ([place])  C50.311    Z17.0     SUMMARY OF ONCOLOGIC HISTORY: Oncology History  Malignant neoplasm of lower-inner quadrant of right breast of female, estrogen receptor positive ([place])  05/25/2019 Initial Diagnosis   Patient palpated a right breast lump for two years. Diagnostic mammogram showed a 4.5cm right breast mass at the 6 o'clock position involving the overlying skin, 2 benign appearing left breast mass at the 10 o'clock position measuring 2.4cm and 2.1cm, and no axillary adenopathy. Biopsy showed: in the left breast, fibroadenoma and no evidence of malignancy, and in the right breast, IDC with DCIS, grade 3.  ER 70%, PR 30%, Ki-67 40%, HER-2 negative by [facility]    06/10/2019 - 10/28/2019 Chemotherapy   The patient had dexamethasone (DECADRON) 4 MG tablet, 4 mg (100 % of original dose 4 mg), Oral, Daily, 1 of 1 cycle, Start date: 06/05/2019, End date: 08/12/2019 Dose modification: 4 mg (original dose 4 mg, Cycle 0) DOXOrubicin (ADRIAMYCIN) chemo injection 118 mg, 60 mg/m2 = 118 mg, Intravenous,  Once, 4 of 4 cycles Dose modification: 50 mg/m2 (original dose 60 mg/m2, Cycle 2, Reason: Dose not tolerated) Administration: 118 mg (06/10/2019), 98 mg (06/24/2019), 98 mg (07/08/2019), 98 mg (07/22/2019) palonosetron (ALOXI) injection 0.25 mg, 0.25 mg, Intravenous,  Once, 4 of 4 cycles Administration: 0.25 mg (06/10/2019), 0.25 mg (06/24/2019), 0.25 mg (07/08/2019), 0.25 mg (07/22/2019) pegfilgrastim-cbqv (UDENYCA)  injection 6 mg, 6 mg, Subcutaneous, Once, 4 of 4 cycles Administration: 6 mg (06/12/2019), 6 mg (06/26/2019), 6 mg (07/10/2019), 6 mg (07/24/2019) cyclophosphamide (CYTOXAN) 1,180 mg in sodium chloride 0.9 % 250 mL chemo infusion, 600 mg/m2 = 1,180 mg, Intravenous,  Once, 4 of 4 cycles Dose modification: 500 mg/m2 (original dose 600 mg/m2, Cycle 2, Reason: Dose not tolerated) Administration: 1,180 mg (06/10/2019), 980 mg (06/24/2019), 980 mg (07/08/2019), 980 mg (07/22/2019) PACLitaxel (TAXOL) 156 mg in sodium chloride 0.9 % 250 mL chemo infusion (</= 61m/m2), 80 mg/m2 = 156 mg, Intravenous,  Once, 12 of 12 cycles Dose modification: 65 mg/m2 (original dose 80 mg/m2, Cycle 11, Reason: Provider Judgment), 50 mg/m2 (original dose 80 mg/m2, Cycle 12, Reason: Dose not tolerated) Administration: 156 mg (08/12/2019), 156 mg (08/19/2019), 138 mg (08/26/2019), 138 mg (09/02/2019), 138 mg (09/09/2019), 138 mg (09/16/2019), 114 mg (09/23/2019), 90 mg (09/30/2019), 90 mg (10/07/2019), 90 mg (10/14/2019), 90 mg (10/21/2019), 90 mg (10/28/2019) fosaprepitant (EMEND) 150 mg, dexamethasone (DECADRON) 12 mg in sodium chloride 0.9 % 145 mL IVPB, , Intravenous,  Once, 4 of 4 cycles Administration:  (06/10/2019),  (06/24/2019),  (07/08/2019),  (07/22/2019)  for chemotherapy treatment.     Genetic Testing   No pathogenic variants identified. VUS in [place] c.2378A>G identified on the [facility] Common Hereditary Cancers Panel. The report date is 06/15/2019.  The Common Hereditary Cancers Panel offered by [facility] includes sequencing and/or deletion duplication testing of the following 48 genes: APC, ATM, AXIN2, BARD1, BMPR1A, BRCA1, BRCA2, BRIP1, CDH1, CDKN2A (p14ARF), CDKN2A (p16INK4a), CKD4, CHEK2, CTNNA1, DICER1, EPCAM (Deletion/duplication testing only), GREM1 (promoter region deletion/duplication testing only), KIT, MEN1, MLH1, MSH2, MSH3, MSH6,  MUTYH, NBN, NF1, NHTL1, PALB2, PDGFRA, PMS2, POLD1, POLE, PTEN, RAD50, RAD51C, RAD51D, RNF43,  SDHB, SDHC, SDHD, SMAD4, SMARCA4. STK11, TP53, TSC1, TSC2, and VHL.  The following genes were evaluated for sequence changes only: SDHA and HOXB13 c.251G>A variant only.   11/18/2019 Surgery   Right lumpectomy Donne Hazel): IDC, grade 3, 1.5cm, with grade 2-3 DCIS, 2 right axillary lymph nodes negative. FISH on pathology revealed HER-2 positivity.   12/09/2019 -  Chemotherapy    Patient is on Treatment Plan: BREAST TRASTUZUMAB Q21D      12/28/2019 - 02/12/2020 Radiation Therapy   Adjuvant radiation   02/2020 -  Anti-estrogen oral therapy   Zoladex monthly until she underwent a BSO in 02/2020; Anastrozole started 02/2020     CHIEF COMPLIANT: Follow-up of right breast cancer on anastrozole, Herceptin maintenance  INTERVAL HISTORY: Kimberly Mueller is a 35 y.o. with above-mentioned history of breast cancerwho completedneoadjuvant chemotherapy,lumpectomy, radiation, and is currently on Herceptin maintenance and antiestrogen therapy with anastrozole. She presents to the clinic todayfortreatment.   She is complaining of profound fatigue.  She had recent CT of the chest which showed an abnormality in the liver for which she is planning to undergo an MRI of the liver.  There was also a thyroid nodule as well as a small lung nodule.  ALLERGIES:  is allergic to adhesive [tape].  MEDICATIONS:  Current Outpatient Medications  Medication Sig Dispense Refill  . acetaminophen (TYLENOL) 500 MG tablet Take 1,000 mg by mouth every 6 (six) hours as needed for mild pain or moderate pain.    Marland Kitchen anastrozole (ARIMIDEX) 1 MG tablet TAKE 1 TABLET BY MOUTH EVERY DAY 90 tablet 2  . bimatoprost (LATISSE) 0.03 % ophthalmic solution Place 1 application into both eyes at bedtime. Place one drop on applicator and apply evenly along the skin of the upper eyelid at base of eyelashes once daily at bedtime; repeat procedure for second eye (use a clean applicator). (Patient taking differently: Place 1 application into both eyes  See admin instructions. Place one drop on applicator and apply evenly along the skin of the upper eyelid at base of eyelashes once every other day at bedtime) 5 mL 12  . Calcium Carbonate-Vit D-Min (CALTRATE PLUS PO) Take 1 tablet by mouth daily.    . Cholecalciferol (VITAMIN D3 PO) Take 1 capsule by mouth daily.    . Cyanocobalamin (B-12 PO) Take 1 tablet by mouth daily.    Marland Kitchen docusate sodium (COLACE) 250 MG capsule Take 250 mg by mouth at bedtime.    Marland Kitchen doxylamine, Sleep, (UNISOM) 25 MG tablet Take 25 mg by mouth at bedtime as needed for sleep.    Marland Kitchen gabapentin (NEURONTIN) 100 MG capsule TAKE 1 CAPSULE BY MOUTH AT BEDTIME. 30 capsule 2  . Multiple Vitamins-Minerals (MULTIVITAMIN WITH MINERALS) tablet Take 1 tablet by mouth daily. [place]    . omeprazole (PRILOSEC) 40 MG capsule Take 1 capsule (40 mg total) by mouth daily. (Patient taking differently: Take 40 mg by mouth daily as needed (acid reflux).) 90 capsule 3  . polyethylene glycol (MIRALAX / GLYCOLAX) 17 g packet Take 17 g by mouth daily.    Marland Kitchen venlafaxine XR (EFFEXOR-XR) 75 MG 24 hr capsule Take 1 capsule (75 mg total) by mouth daily with breakfast. 90 capsule 3  . zolpidem (AMBIEN) 10 MG tablet Take 1 tablet (10 mg total) by mouth at bedtime as needed for sleep. (Patient taking differently: Take 5 mg by mouth at bedtime.) 30 tablet 3   No  current facility-administered medications for this visit.    PHYSICAL EXAMINATION: ECOG PERFORMANCE STATUS: 1 - Symptomatic but completely ambulatory  Vitals:   09/28/20 0926  BP: 103/69  Pulse: 84  Resp: 20  Temp: 98 F (36.7 C)  SpO2: 100%   Filed Weights   09/28/20 0926  Weight: 115 lb 8 oz (52.4 kg)    LABORATORY DATA:  I have reviewed the data as listed CMP Latest Ref Rng & Units 09/28/2020 08/17/2020 07/27/2020  Glucose 70 - 99 mg/dL 95 71 76  BUN 6 - 20 mg/dL 6 19 26(H)  Creatinine 0.44 - 1.00 mg/dL 0.76 0.69 0.72  Sodium 135 - 145 mmol/L 141 140 137  Potassium 3.5 - 5.1 mmol/L 4.1  4.4 5.2(H)  Chloride 98 - 111 mmol/L 101 105 102  CO2 22 - 32 mmol/L '28 29 29  ' Calcium 8.9 - 10.3 mg/dL 10.2 9.9 9.6  Total Protein 6.5 - 8.1 g/dL 7.7 7.6 7.0  Total Bilirubin 0.3 - 1.2 mg/dL 0.5 0.4 0.5  Alkaline Phos 38 - 126 U/L 102 101 74  AST 15 - 41 U/L 26 35 61(H)  ALT 0 - 44 U/L 32 40 56(H)    Lab Results  Component Value Date   WBC 4.3 09/28/2020   HGB 13.7 09/28/2020   HCT 40.6 09/28/2020   MCV 91.4 09/28/2020   PLT 300 09/28/2020   NEUTROABS 2.8 09/28/2020    ASSESSMENT & PLAN:  Malignant neoplasm of lower-inner quadrant of right breast of female, estrogen receptor positive ([place]) 05/15/2019:Patient palpated a right breast lump for two years. Diagnostic mammogram showed a 4.5cm right breast mass at the 6 o'clock position involving the overlying skin, 2 benign appearing left breast mass at the 10 o'clock position measuring 2.4cm and 2.1cm, and no axillary adenopathy. 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 Maintenance Herceptin subcutaneously every three weeks x 1 year starting 12/09/2019 3.Adjuvant radiation beginning 12/31/2019 4.Follow-up adjuvant antiestrogen therapyBSO scheduled 7/31, currently on Zoladex, to start Anastrozole mid 02/2020 ----------------------------------------------------------------------------------------------------------------------------------------------- 03/08/2020: Bilateral  salpingo-oophorectomy  Herceptin:Will be completedMay 25th 2022 Echo on 06/22/2020 EF 50-55%--followed by Dr. Haroldine Laws, to be repeated in 6 weeks Weight loss:Prior gastric bypass surgery, stable Current treatment: Anastrozole and Herceptin  Anastrozole toxicities: 1.Hot flashes: On Effexor 2.fatigue:Marked improvement. 3.Elevation of LFTs: Normalized.  Depression:  I increase the dosage of Effexor to 150 mg daily. Insomnia issues: Currently on Valium  Breast cancer surveillance: Mammograms in 06/2020 normal, breast density D. MRI recommended and orders placed  09/05/20: Bilateral breast augmentation with silicone implants 2. Bilateral breast mastopexy  I discussed with her about neratinib after completion of Herceptin treatment. Severe fatigue: We will send for TSH and B12 (B12 level 844).  Return to clinic every 3 weeks for Herceptin every 6 weeks for follow-up with me.     No orders of the defined types were placed in this encounter.  The patient has a good understanding of the overall plan. she agrees with it. she will call with  any problems that may develop before the next visit here.  Total time spent: 30 mins including face to face time and time spent for planning, charting and coordination of care  Rulon Eisenmenger, MD, MPH 09/28/2020  I, Molly Dorshimer, am acting as scribe for Dr. Nicholas Lose.  I have reviewed the above documentation for accuracy and completeness, and I agree with the above.

## 2020-09-28 ENCOUNTER — Inpatient Hospital Stay: Payer: No Typology Code available for payment source

## 2020-09-28 ENCOUNTER — Other Ambulatory Visit: Payer: Self-pay

## 2020-09-28 ENCOUNTER — Inpatient Hospital Stay (HOSPITAL_BASED_OUTPATIENT_CLINIC_OR_DEPARTMENT_OTHER): Payer: No Typology Code available for payment source | Admitting: Hematology and Oncology

## 2020-09-28 ENCOUNTER — Encounter: Payer: Self-pay | Admitting: *Deleted

## 2020-09-28 ENCOUNTER — Inpatient Hospital Stay: Payer: No Typology Code available for payment source | Attending: Hematology and Oncology

## 2020-09-28 VITALS — BP 103/69 | HR 84 | Temp 98.0°F | Resp 20 | Ht 63.0 in | Wt 115.5 lb

## 2020-09-28 DIAGNOSIS — R232 Flushing: Secondary | ICD-10-CM | POA: Insufficient documentation

## 2020-09-28 DIAGNOSIS — F32A Depression, unspecified: Secondary | ICD-10-CM | POA: Diagnosis not present

## 2020-09-28 DIAGNOSIS — Z79811 Long term (current) use of aromatase inhibitors: Secondary | ICD-10-CM | POA: Diagnosis not present

## 2020-09-28 DIAGNOSIS — R5383 Other fatigue: Secondary | ICD-10-CM

## 2020-09-28 DIAGNOSIS — G4700 Insomnia, unspecified: Secondary | ICD-10-CM | POA: Diagnosis not present

## 2020-09-28 DIAGNOSIS — R948 Abnormal results of function studies of other organs and systems: Secondary | ICD-10-CM | POA: Insufficient documentation

## 2020-09-28 DIAGNOSIS — Z9884 Bariatric surgery status: Secondary | ICD-10-CM | POA: Diagnosis not present

## 2020-09-28 DIAGNOSIS — C50311 Malignant neoplasm of lower-inner quadrant of right female breast: Secondary | ICD-10-CM | POA: Diagnosis not present

## 2020-09-28 DIAGNOSIS — Z170 Estrogen receptor positive status [ER+]: Secondary | ICD-10-CM

## 2020-09-28 DIAGNOSIS — Z79899 Other long term (current) drug therapy: Secondary | ICD-10-CM | POA: Diagnosis not present

## 2020-09-28 DIAGNOSIS — Z9221 Personal history of antineoplastic chemotherapy: Secondary | ICD-10-CM | POA: Insufficient documentation

## 2020-09-28 DIAGNOSIS — Z5112 Encounter for antineoplastic immunotherapy: Secondary | ICD-10-CM | POA: Insufficient documentation

## 2020-09-28 DIAGNOSIS — Z923 Personal history of irradiation: Secondary | ICD-10-CM | POA: Insufficient documentation

## 2020-09-28 DIAGNOSIS — E041 Nontoxic single thyroid nodule: Secondary | ICD-10-CM | POA: Insufficient documentation

## 2020-09-28 DIAGNOSIS — R911 Solitary pulmonary nodule: Secondary | ICD-10-CM | POA: Insufficient documentation

## 2020-09-28 LAB — CBC WITH DIFFERENTIAL (CANCER CENTER ONLY)
Abs Immature Granulocytes: 0.01 10*3/uL (ref 0.00–0.07)
Basophils Absolute: 0 10*3/uL (ref 0.0–0.1)
Basophils Relative: 1 %
Eosinophils Absolute: 0.1 10*3/uL (ref 0.0–0.5)
Eosinophils Relative: 3 %
HCT: 40.6 % (ref 36.0–46.0)
Hemoglobin: 13.7 g/dL (ref 12.0–15.0)
Immature Granulocytes: 0 %
Lymphocytes Relative: 23 %
Lymphs Abs: 1 10*3/uL (ref 0.7–4.0)
MCH: 30.9 pg (ref 26.0–34.0)
MCHC: 33.7 g/dL (ref 30.0–36.0)
MCV: 91.4 fL (ref 80.0–100.0)
Monocytes Absolute: 0.4 10*3/uL (ref 0.1–1.0)
Monocytes Relative: 9 %
Neutro Abs: 2.8 10*3/uL (ref 1.7–7.7)
Neutrophils Relative %: 64 %
Platelet Count: 300 10*3/uL (ref 150–400)
RBC: 4.44 MIL/uL (ref 3.87–5.11)
RDW: 12.1 % (ref 11.5–15.5)
WBC Count: 4.3 10*3/uL (ref 4.0–10.5)
nRBC: 0 % (ref 0.0–0.2)

## 2020-09-28 LAB — CMP (CANCER CENTER ONLY)
ALT: 32 U/L (ref 0–44)
AST: 26 U/L (ref 15–41)
Albumin: 4.3 g/dL (ref 3.5–5.0)
Alkaline Phosphatase: 102 U/L (ref 38–126)
Anion gap: 12 (ref 5–15)
BUN: 6 mg/dL (ref 6–20)
CO2: 28 mmol/L (ref 22–32)
Calcium: 10.2 mg/dL (ref 8.9–10.3)
Chloride: 101 mmol/L (ref 98–111)
Creatinine: 0.76 mg/dL (ref 0.44–1.00)
GFR, Estimated: >60 mL/min (ref >60)
Glucose, Bld: 95 mg/dL (ref 70–99)
Potassium: 4.1 mmol/L (ref 3.5–5.1)
Sodium: 141 mmol/L (ref 135–145)
Total Bilirubin: 0.5 mg/dL (ref 0.3–1.2)
Total Protein: 7.7 g/dL (ref 6.5–8.1)

## 2020-09-28 LAB — VITAMIN B12: Vitamin B-12: 844 pg/mL (ref 180–914)

## 2020-09-28 MED ORDER — ACETAMINOPHEN 325 MG PO TABS
ORAL_TABLET | ORAL | Status: AC
  Filled 2020-09-28: qty 2

## 2020-09-28 MED ORDER — DIPHENHYDRAMINE HCL 25 MG PO CAPS
ORAL_CAPSULE | ORAL | Status: AC
  Filled 2020-09-28: qty 2

## 2020-09-28 MED ORDER — ACETAMINOPHEN 325 MG PO TABS
650.0000 mg | ORAL_TABLET | Freq: Once | ORAL | Status: AC
Start: 2020-09-28 — End: 2020-09-28
  Administered 2020-09-28: 650 mg via ORAL

## 2020-09-28 MED ORDER — VENLAFAXINE HCL ER 150 MG PO CP24: 150.0000 mg | ORAL_CAPSULE | Freq: Every day | ORAL | 6 refills | Status: DC

## 2020-09-28 MED ORDER — DIPHENHYDRAMINE HCL 25 MG PO CAPS
50.0000 mg | ORAL_CAPSULE | Freq: Once | ORAL | Status: AC
  Administered 2020-09-28: 50 mg via ORAL

## 2020-09-28 MED ORDER — TRASTUZUMAB-HYALURONIDASE-OYSK 600-10000 MG-UNT/5ML ~~LOC~~ SOLN
600.0000 mg | Freq: Once | SUBCUTANEOUS | Status: AC
  Administered 2020-09-28: 600 mg via SUBCUTANEOUS
  Filled 2020-09-28: qty 5

## 2020-09-28 NOTE — Patient Instructions (Signed)
[facility] Discharge Instructions for Patients Receiving Chemotherapy  Today you received the following chemotherapy agents herceptin hylecta   To help prevent nausea and vomiting after your treatment, we encourage you to take your nausea medication as directed.    If you develop nausea and vomiting that is not controlled by your nausea medication, call the clinic.   BELOW ARE SYMPTOMS THAT SHOULD BE REPORTED IMMEDIATELY:  *FEVER GREATER THAN 100.5 F  *CHILLS WITH OR WITHOUT FEVER  NAUSEA AND VOMITING THAT IS NOT CONTROLLED WITH YOUR NAUSEA MEDICATION  *UNUSUAL SHORTNESS OF BREATH  *UNUSUAL BRUISING OR BLEEDING  TENDERNESS IN MOUTH AND THROAT WITH OR WITHOUT PRESENCE OF ULCERS  *URINARY PROBLEMS  *BOWEL PROBLEMS  UNUSUAL RASH Items with * indicate a potential emergency and should be followed up as soon as possible.  Feel free to call the clinic should you have any questions or concerns. The clinic phone number is [phone].  Please show the CHEMO ALERT CARD at check-in to the Emergency Department and triage nurse.   

## 2020-09-29 ENCOUNTER — Telehealth: Payer: Self-pay | Admitting: Hematology and Oncology

## 2020-09-29 LAB — THYROID PANEL WITH TSH
Free Thyroxine Index: 1.8 (ref 1.2–4.9)
T3 Uptake Ratio: 22 % — ABNORMAL LOW (ref 24–39)
T4, Total: 8 ug/dL (ref 4.5–12.0)
TSH: 1.05 u[IU]/mL (ref 0.450–4.500)

## 2020-09-29 NOTE — Telephone Encounter (Signed)
Scheduled per 3/23 los. Pt will receive an updated appt calendar per next visit appt notes

## 2020-10-09 ENCOUNTER — Other Ambulatory Visit: Payer: Self-pay | Admitting: Medical

## 2020-10-10 NOTE — Telephone Encounter (Signed)
Dr. Lindi Adie, Lucianne Lei refilled this 1 year ago.  Sending to you for approval or refusal. Gardiner Rhyme, RN

## 2020-10-18 ENCOUNTER — Encounter: Payer: Self-pay | Admitting: *Deleted

## 2020-10-19 ENCOUNTER — Inpatient Hospital Stay: Payer: No Typology Code available for payment source | Attending: Hematology and Oncology

## 2020-10-19 ENCOUNTER — Other Ambulatory Visit: Payer: Self-pay

## 2020-10-19 VITALS — BP 119/86 | HR 98 | Temp 99.1°F | Resp 20 | Wt 109.0 lb

## 2020-10-19 DIAGNOSIS — Z5112 Encounter for antineoplastic immunotherapy: Secondary | ICD-10-CM | POA: Diagnosis not present

## 2020-10-19 DIAGNOSIS — C50311 Malignant neoplasm of lower-inner quadrant of right female breast: Secondary | ICD-10-CM | POA: Insufficient documentation

## 2020-10-19 DIAGNOSIS — Z170 Estrogen receptor positive status [ER+]: Secondary | ICD-10-CM | POA: Diagnosis not present

## 2020-10-19 MED ORDER — DIPHENHYDRAMINE HCL 25 MG PO CAPS
ORAL_CAPSULE | ORAL | Status: AC
  Filled 2020-10-19: qty 2

## 2020-10-19 MED ORDER — TRASTUZUMAB-HYALURONIDASE-OYSK 600-10000 MG-UNT/5ML ~~LOC~~ SOLN
600.0000 mg | Freq: Once | SUBCUTANEOUS | Status: AC
  Administered 2020-10-19: 600 mg via SUBCUTANEOUS
  Filled 2020-10-19: qty 5

## 2020-10-19 MED ORDER — ACETAMINOPHEN 325 MG PO TABS
ORAL_TABLET | ORAL | Status: AC
  Filled 2020-10-19: qty 2

## 2020-10-19 MED ORDER — DIPHENHYDRAMINE HCL 25 MG PO CAPS
50.0000 mg | ORAL_CAPSULE | Freq: Once | ORAL | Status: AC
  Administered 2020-10-19: 50 mg via ORAL

## 2020-10-19 MED ORDER — ACETAMINOPHEN 325 MG PO TABS
650.0000 mg | ORAL_TABLET | Freq: Once | ORAL | Status: AC
  Administered 2020-10-19: 650 mg via ORAL

## 2020-10-19 NOTE — Patient Instructions (Signed)
[facility] °Discharge Instructions for Patients Receiving Chemotherapy ° °Today you received the following chemotherapy agents: Herceptin Hylecta. ° °To help prevent nausea and vomiting after your treatment, we encourage you to take your nausea medication as directed. °  °If you develop nausea and vomiting that is not controlled by your nausea medication, call the clinic.  ° °BELOW ARE SYMPTOMS THAT SHOULD BE REPORTED IMMEDIATELY: °· *FEVER GREATER THAN 100.5 F °· *CHILLS WITH OR WITHOUT FEVER °· NAUSEA AND VOMITING THAT IS NOT CONTROLLED WITH YOUR NAUSEA MEDICATION °· *UNUSUAL SHORTNESS OF BREATH °· *UNUSUAL BRUISING OR BLEEDING °· TENDERNESS IN MOUTH AND THROAT WITH OR WITHOUT PRESENCE OF ULCERS °· *URINARY PROBLEMS °· *BOWEL PROBLEMS °· UNUSUAL RASH °Items with * indicate a potential emergency and should be followed up as soon as possible. ° °Feel free to call the clinic should you have any questions or concerns. The clinic phone number is [phone]. ° °Please show the CHEMO ALERT CARD at check-in to the Emergency Department and triage nurse. ° ° °

## 2020-10-27 ENCOUNTER — Telehealth: Payer: Self-pay | Admitting: *Deleted

## 2020-10-27 NOTE — Telephone Encounter (Signed)
Pt called informing will be moving to [facility] next week. Would like to know if she can forgo the final 2 herceptin tx and start Nerylnx. Msg sent to Dr. Lindi Adie.

## 2020-10-28 ENCOUNTER — Other Ambulatory Visit: Payer: Self-pay | Admitting: *Deleted

## 2020-10-28 DIAGNOSIS — F5101 Primary insomnia: Secondary | ICD-10-CM

## 2020-10-28 MED ORDER — ZOLPIDEM TARTRATE 10 MG PO TABS: 10.0000 mg | ORAL_TABLET | Freq: Every evening | ORAL | 3 refills | Status: DC | PRN

## 2020-10-30 NOTE — Progress Notes (Signed)
CARDIO-ONCOLOGY CLINIC NOTE   DID NOT SHOW FOR APPT. NOTE LEFT FOR TEMPLATING PURPOSES ONLY     HPI:  Kimberly Mueller is 35 y.o. female with right breast cancer referred by Dr. Lindi Adie for enrollment into the Cardio-Oncology program.  Summary of oncology history as below. She has completed neoadjuvant chemotherapy with adriamycin based regimen starting in 12/20, lumpectomy, XRT. Path revealed HER-2 positivity and is currently on Herceptin maintenance and antiestrogen therapy with anastrozole.   She is active without SOB or other CV symptoms. Works as Mudlogger for [employer]. Has occasional episodes of palpitations that last 10-15 mins that go away spontaneously. Occurs about 1x/month.   Echo 06/22/20 EF  50-55%  GLS -13.3 (underestimated) Personally reviewed  Echo 03/15/20 EF 55-60 GLS -13.5 Personally reviewed   Oncology History  Malignant neoplasm of lower-inner quadrant of right breast of female, estrogen receptor positive ([place])  05/25/2019 Initial Diagnosis    Patient palpated a right breast lump for two years. Diagnostic mammogram showed a 4.5cm right breast mass at the 6 o'clock position involving the overlying skin, 2 benign appearing left breast mass at the 10 o'clock position measuring 2.4cm and 2.1cm, and no axillary adenopathy. Biopsy showed: in the left breast, fibroadenoma and no evidence of malignancy, and in the right breast, IDC with DCIS, grade 3.  ER 70%, PR 30%, Ki-67 40%, HER-2 negative by [facility]     06/10/2019 - 10/28/2019 Chemotherapy    The patient had dexamethasone (DECADRON) 4 MG tablet, 4 mg (100 % of original dose 4 mg), Oral, Daily, 1 of 1 cycle, Start date: 06/05/2019, End date: 08/12/2019 Dose modification: 4 mg (original dose 4 mg, Cycle 0) DOXOrubicin (ADRIAMYCIN) chemo injection 118 mg, 60 mg/m2 = 118 mg, Intravenous,  Once, 4 of 4 cycles Dose modification: 50 mg/m2 (original dose 60 mg/m2, Cycle 2, Reason: Dose not tolerated) Administration: 118 mg (06/10/2019), 98  mg (06/24/2019), 98 mg (07/08/2019), 98 mg (07/22/2019) palonosetron (ALOXI) injection 0.25 mg, 0.25 mg, Intravenous,  Once, 4 of 4 cycles Administration: 0.25 mg (06/10/2019), 0.25 mg (06/24/2019), 0.25 mg (07/08/2019), 0.25 mg (07/22/2019) pegfilgrastim-cbqv (UDENYCA) injection 6 mg, 6 mg, Subcutaneous, Once, 4 of 4 cycles Administration: 6 mg (06/12/2019), 6 mg (06/26/2019), 6 mg (07/10/2019), 6 mg (07/24/2019) cyclophosphamide (CYTOXAN) 1,180 mg in sodium chloride 0.9 % 250 mL chemo infusion, 600 mg/m2 = 1,180 mg, Intravenous,  Once, 4 of 4 cycles Dose modification: 500 mg/m2 (original dose 600 mg/m2, Cycle 2, Reason: Dose not tolerated) Administration: 1,180 mg (06/10/2019), 980 mg (06/24/2019), 980 mg (07/08/2019), 980 mg (07/22/2019) PACLitaxel (TAXOL) 156 mg in sodium chloride 0.9 % 250 mL chemo infusion (</= 75m/m2), 80 mg/m2 = 156 mg, Intravenous,  Once, 12 of 12 cycles Dose modification: 65 mg/m2 (original dose 80 mg/m2, Cycle 11, Reason: Provider Judgment), 50 mg/m2 (original dose 80 mg/m2, Cycle 12, Reason: Dose not tolerated) Administration: 156 mg (08/12/2019), 156 mg (08/19/2019), 138 mg (08/26/2019), 138 mg (09/02/2019), 138 mg (09/09/2019), 138 mg (09/16/2019), 114 mg (09/23/2019), 90 mg (09/30/2019), 90 mg (10/07/2019), 90 mg (10/14/2019), 90 mg (10/21/2019), 90 mg (10/28/2019) fosaprepitant (EMEND) 150 mg, dexamethasone (DECADRON) 12 mg in sodium chloride 0.9 % 145 mL IVPB, , Intravenous,  Once, 4 of 4 cycles Administration:  (06/10/2019),  (06/24/2019),  (07/08/2019),  (07/22/2019)  for chemotherapy treatment.       Genetic Testing    No pathogenic variants identified. VUS [place] c.2378A>G identified on the [facility] Common Hereditary Cancers Panel. The report date is 06/15/2019.  The Common Hereditary Cancers Panel offered by [facility] includes sequencing and/or deletion duplication testing of the following 48 genes: APC, ATM, AXIN2, BARD1, BMPR1A, BRCA1, BRCA2, BRIP1, CDH1, CDKN2A (p14ARF),  CDKN2A (p16INK4a), CKD4, CHEK2, CTNNA1, DICER1, EPCAM (Deletion/duplication testing only), GREM1 (promoter region deletion/duplication testing only), KIT, MEN1, MLH1, MSH2, MSH3, MSH6, MUTYH, NBN, NF1, NHTL1, PALB2, PDGFRA, PMS2, POLD1, POLE, PTEN, RAD50, RAD51C, RAD51D, RNF43, SDHB, SDHC, SDHD, SMAD4, SMARCA4. STK11, TP53, TSC1, TSC2, and VHL.  The following genes were evaluated for sequence changes only: SDHA and HOXB13 c.251G>A variant only.    11/18/2019 Surgery    Right lumpectomy Donne Hazel): IDC, grade 3, 1.5cm, with grade 2-3 DCIS, 2 right axillary lymph nodes negative. FISH on pathology revealed HER-2 positivity.    12/09/2019 -  Chemotherapy    The patient had trastuzumab-hyaluronidase-oysk (HERCEPTIN HYLECTA) 600-10000 MG-UNT/5ML chemo SQ injection 600 mg, 600 mg, Subcutaneous,  Once, 8 of 17 cycles Administration: 600 mg (12/09/2019), 600 mg (12/30/2019), 600 mg (01/20/2020), 600 mg (02/10/2020), 600 mg (03/02/2020), 600 mg (03/23/2020), 600 mg (04/14/2020), 600 mg (05/04/2020)  for chemotherapy treatment.     12/28/2019 - 02/12/2020 Radiation Therapy    Adjuvant radiation    02/2020 -  Anti-estrogen oral therapy    Zoladex monthly until she underwent a BSO in 02/2020; Anastrozole started 02/2020       Past Medical History:  Diagnosis Date   Anxiety    Breast cancer ([place])    Cancer ([place])    breast   Depression    Family history of pancreatic cancer    Family history of prostate cancer    GERD (gastroesophageal reflux disease)    History of kidney stones     Current Outpatient Medications  Medication Sig Dispense Refill   acetaminophen (TYLENOL) 500 MG tablet Take 1,000 mg by mouth every 6 (six) hours as needed for mild pain or moderate pain.     anastrozole (ARIMIDEX) 1 MG tablet TAKE 1 TABLET BY MOUTH EVERY DAY 90 tablet 2   bimatoprost (LATISSE) 0.03 % ophthalmic solution Place 1 application into both eyes at bedtime. Place one drop on applicator and apply evenly along the skin of the  upper eyelid at base of eyelashes once daily at bedtime; repeat procedure for second eye (use a clean applicator). (Patient taking differently: Place 1 application into both eyes See admin instructions. Place one drop on applicator and apply evenly along the skin of the upper eyelid at base of eyelashes once every other day at bedtime) 5 mL 12   Calcium Carbonate-Vit D-Min (CALTRATE PLUS PO) Take 1 tablet by mouth daily.     Cholecalciferol (VITAMIN D3 PO) Take 1 capsule by mouth daily.     Cyanocobalamin (B-12 PO) Take 1 tablet by mouth daily.     docusate sodium (COLACE) 250 MG capsule Take 250 mg by mouth at bedtime.     doxylamine, Sleep, (UNISOM) 25 MG tablet Take 25 mg by mouth at bedtime as needed for sleep.     gabapentin (NEURONTIN) 100 MG capsule TAKE 1 CAPSULE BY MOUTH AT BEDTIME. 30 capsule 2   Multiple Vitamins-Minerals (MULTIVITAMIN WITH MINERALS) tablet Take 1 tablet by mouth daily. [place]     omeprazole (PRILOSEC) 40 MG capsule Take 1 capsule (40 mg total) by mouth daily as needed (acid reflux). 90 capsule 3   polyethylene glycol (MIRALAX / GLYCOLAX) 17 g packet Take 17 g by mouth daily.     venlafaxine XR (EFFEXOR-XR) 150 MG 24 hr capsule Take 1 capsule (  150 mg total) by mouth daily with breakfast. 30 capsule 6   zolpidem (AMBIEN) 10 MG tablet Take 1 tablet (10 mg total) by mouth at bedtime as needed for sleep. 30 tablet 3   No current facility-administered medications for this encounter.    Allergies  Allergen Reactions   Adhesive [Tape] Itching and Rash      Social History   Socioeconomic History   Marital status: Married    Spouse name: Not on file   Number of children: Not on file   Years of education: Not on file   Highest education level: Not on file  Occupational History   Not on file  Tobacco Use   Smoking status: Former Smoker   Smokeless tobacco: Never Used  Scientific laboratory technician Use: Some days  Substance and Sexual Activity   Alcohol use: Yes     Comment: occas.   Drug use: Never   Sexual activity: Not on file  Other Topics Concern   Not on file  Social History Narrative   Not on file   Social Determinants of Health   Financial Resource Strain: Not on file  Food Insecurity: Not on file  Transportation Needs: Not on file  Physical Activity: Not on file  Stress: Not on file  Social Connections: Not on file  Intimate Partner Violence: Not on file      Family History  Problem Relation Age of Onset   Prostate cancer Father    Pancreatic cancer Paternal Grandmother   PGF - died of MI in 59s  There were no vitals filed for this visit.  PHYSICAL EXAM: General:  Well appearing. No respiratory difficulty HEENT: normal Neck: supple. no JVD. Carotids 2+ bilat; no bruits. No lymphadenopathy or thryomegaly appreciated. Cor: PMI nondisplaced. Regular rate & rhythm. No rubs, gallops or murmurs. Lungs: clear Abdomen: soft, nontender, nondistended. No hepatosplenomegaly. No bruits or masses. Good bowel sounds. Extremities: no cyanosis, clubbing, rash, edema Neuro: alert & oriented x 3, cranial nerves grossly intact. moves all 4 extremities w/o difficulty. Affect pleasant.   ASSESSMENT & PLAN:  1. Right Breast Cancer diagnosed 11/20 -  She has completed neoadjuvant chemotherapy with adriamycin based regimen starting in 12/20 (initial path HER-2 negative), lumpectomy, radiation.  Lumpectomy pathology revealed HER-2 positivity and is currently on Herceptin  and antiestrogen therapy with anastrozole starting in 6/21 - Echo 06/22/20 EF slightly down from previous 50-55% unclear if very mild Hercepti cardiotoxicity or just slight variation. Repeat echo after next 2 rounds of Herceptin  2. Palpitations - Zio 12/21: Two brief runs SVT. Occasional Wenckebach  Glori Bickers MD

## 2020-11-03 ENCOUNTER — Ambulatory Visit (HOSPITAL_COMMUNITY): Admission: RE | Admit: 2020-11-03 | Payer: No Typology Code available for payment source | Source: Ambulatory Visit

## 2020-11-03 ENCOUNTER — Inpatient Hospital Stay (HOSPITAL_BASED_OUTPATIENT_CLINIC_OR_DEPARTMENT_OTHER)
Admission: RE | Admit: 2020-11-03 | Discharge: 2020-11-03 | Disposition: A | Discharge status: Home / Self Care | Payer: No Typology Code available for payment source | Source: Ambulatory Visit | Attending: Internal Medicine | Admitting: Internal Medicine

## 2020-11-03 DIAGNOSIS — D249 Benign neoplasm of unspecified breast: Secondary | ICD-10-CM

## 2020-11-04 ENCOUNTER — Other Ambulatory Visit: Payer: Self-pay | Admitting: Hematology and Oncology

## 2020-11-04 DIAGNOSIS — C50311 Malignant neoplasm of lower-inner quadrant of right female breast: Secondary | ICD-10-CM

## 2020-11-04 DIAGNOSIS — Z170 Estrogen receptor positive status [ER+]: Secondary | ICD-10-CM

## 2020-11-08 NOTE — Progress Notes (Signed)
Patient Care Team: Jilda Panda, MD as PCP - General (Internal Medicine) Nicholas Lose, MD as Consulting Physician (Hematology and Oncology) Rolm Bookbinder, MD as Consulting Physician (General Surgery) Gery Pray, MD as Consulting Physician (Radiation Oncology) Rockwell Germany, RN as Oncology Nurse Navigator Mauro Kaufmann, RN as Oncology Nurse Navigator  DIAGNOSIS:    ICD-10-CM   1. Malignant neoplasm of lower-inner quadrant of right breast of female, estrogen receptor positive ([place])  C50.311    Z17.0     SUMMARY OF ONCOLOGIC HISTORY: Oncology History  Malignant neoplasm of lower-inner quadrant of right breast of female, estrogen receptor positive ([place])  05/25/2019 Initial Diagnosis   Patient palpated a right breast lump for two years. Diagnostic mammogram showed a 4.5cm right breast mass at the 6 o'clock position involving the overlying skin, 2 benign appearing left breast mass at the 10 o'clock position measuring 2.4cm and 2.1cm, and no axillary adenopathy. Biopsy showed: in the left breast, fibroadenoma and no evidence of malignancy, and in the right breast, IDC with DCIS, grade 3.  ER 70%, PR 30%, Ki-67 40%, HER-2 negative by [facility]    06/10/2019 - 10/28/2019 Chemotherapy   The patient had dexamethasone (DECADRON) 4 MG tablet, 4 mg (100 % of original dose 4 mg), Oral, Daily, 1 of 1 cycle, Start date: 06/05/2019, End date: 08/12/2019 Dose modification: 4 mg (original dose 4 mg, Cycle 0) DOXOrubicin (ADRIAMYCIN) chemo injection 118 mg, 60 mg/m2 = 118 mg, Intravenous,  Once, 4 of 4 cycles Dose modification: 50 mg/m2 (original dose 60 mg/m2, Cycle 2, Reason: Dose not tolerated) Administration: 118 mg (06/10/2019), 98 mg (06/24/2019), 98 mg (07/08/2019), 98 mg (07/22/2019) palonosetron (ALOXI) injection 0.25 mg, 0.25 mg, Intravenous,  Once, 4 of 4 cycles Administration: 0.25 mg (06/10/2019), 0.25 mg (06/24/2019), 0.25 mg (07/08/2019), 0.25 mg (07/22/2019) pegfilgrastim-cbqv (UDENYCA)  injection 6 mg, 6 mg, Subcutaneous, Once, 4 of 4 cycles Administration: 6 mg (06/12/2019), 6 mg (06/26/2019), 6 mg (07/10/2019), 6 mg (07/24/2019) cyclophosphamide (CYTOXAN) 1,180 mg in sodium chloride 0.9 % 250 mL chemo infusion, 600 mg/m2 = 1,180 mg, Intravenous,  Once, 4 of 4 cycles Dose modification: 500 mg/m2 (original dose 600 mg/m2, Cycle 2, Reason: Dose not tolerated) Administration: 1,180 mg (06/10/2019), 980 mg (06/24/2019), 980 mg (07/08/2019), 980 mg (07/22/2019) PACLitaxel (TAXOL) 156 mg in sodium chloride 0.9 % 250 mL chemo infusion (</= 61m/m2), 80 mg/m2 = 156 mg, Intravenous,  Once, 12 of 12 cycles Dose modification: 65 mg/m2 (original dose 80 mg/m2, Cycle 11, Reason: Provider Judgment), 50 mg/m2 (original dose 80 mg/m2, Cycle 12, Reason: Dose not tolerated) Administration: 156 mg (08/12/2019), 156 mg (08/19/2019), 138 mg (08/26/2019), 138 mg (09/02/2019), 138 mg (09/09/2019), 138 mg (09/16/2019), 114 mg (09/23/2019), 90 mg (09/30/2019), 90 mg (10/07/2019), 90 mg (10/14/2019), 90 mg (10/21/2019), 90 mg (10/28/2019) fosaprepitant (EMEND) 150 mg, dexamethasone (DECADRON) 12 mg in sodium chloride 0.9 % 145 mL IVPB, , Intravenous,  Once, 4 of 4 cycles Administration:  (06/10/2019),  (06/24/2019),  (07/08/2019),  (07/22/2019)  for chemotherapy treatment.     Genetic Testing   No pathogenic variants identified. VUS in [place] c.[id number] identified on the [facility] Common Hereditary Cancers Panel. The report date is 06/15/2019.  The Common Hereditary Cancers Panel offered by [facility] includes sequencing and/or deletion duplication testing of the following 48 genes: APC, ATM, AXIN2, BARD1, BMPR1A, BRCA1, BRCA2, BRIP1, CDH1, CDKN2A (p14ARF), CDKN2A (p16INK4a), CKD4, CHEK2, CTNNA1, DICER1, EPCAM (Deletion/duplication testing only), GREM1 (promoter region deletion/duplication testing only), KIT, MEN1, MLH1, MSH2, MSH3, MSH6,  MUTYH, NBN, NF1, NHTL1, PALB2, PDGFRA, PMS2, POLD1, POLE, PTEN, RAD50, RAD51C, RAD51D, RNF43,  SDHB, SDHC, SDHD, SMAD4, SMARCA4. STK11, TP53, TSC1, TSC2, and VHL.  The following genes were evaluated for sequence changes only: SDHA and HOXB13 c.251G>A variant only.   11/18/2019 Surgery   Right lumpectomy Donne Hazel): IDC, grade 3, 1.5cm, with grade 2-3 DCIS, 2 right axillary lymph nodes negative. FISH on pathology revealed HER-2 positivity.   12/09/2019 -  Chemotherapy    Patient is on Treatment Plan: BREAST TRASTUZUMAB Q21D      12/28/2019 - 02/12/2020 Radiation Therapy   Adjuvant radiation   02/2020 -  Anti-estrogen oral therapy   Zoladex monthly until she underwent a BSO in 02/2020; Anastrozole started 02/2020     CHIEF COMPLIANT: Follow-up of right breast cancer on anastrozole, Herceptin maintenance  INTERVAL HISTORY: Kimberly Mueller is a 35 y.o. with above-mentioned history of breast cancerwho completedneoadjuvant chemotherapy,lumpectomy, radiation, and is currently on Herceptin maintenance and antiestrogen therapy with anastrozole.She presents to the clinic todayfortreatment. Complains of acne  ALLERGIES:  is allergic to adhesive [tape].  MEDICATIONS:  Current Outpatient Medications  Medication Sig Dispense Refill  . acetaminophen (TYLENOL) 500 MG tablet Take 1,000 mg by mouth every 6 (six) hours as needed for mild pain or moderate pain.    Marland Kitchen anastrozole (ARIMIDEX) 1 MG tablet TAKE 1 TABLET BY MOUTH EVERY DAY 90 tablet 2  . bimatoprost (LATISSE) 0.03 % ophthalmic solution Place 1 application into both eyes at bedtime. Place one drop on applicator and apply evenly along the skin of the upper eyelid at base of eyelashes once daily at bedtime; repeat procedure for second eye (use a clean applicator). (Patient taking differently: Place 1 application into both eyes See admin instructions. Place one drop on applicator and apply evenly along the skin of the upper eyelid at base of eyelashes once every other day at bedtime) 5 mL 12  . Calcium Carbonate-Vit D-Min (CALTRATE PLUS PO)  Take 1 tablet by mouth daily.    . Cholecalciferol (VITAMIN D3 PO) Take 1 capsule by mouth daily.    . Cyanocobalamin (B-12 PO) Take 1 tablet by mouth daily.    Marland Kitchen docusate sodium (COLACE) 250 MG capsule Take 250 mg by mouth at bedtime.    Marland Kitchen doxylamine, Sleep, (UNISOM) 25 MG tablet Take 25 mg by mouth at bedtime as needed for sleep.    Marland Kitchen gabapentin (NEURONTIN) 100 MG capsule TAKE 1 CAPSULE BY MOUTH AT BEDTIME. 30 capsule 2  . Multiple Vitamins-Minerals (MULTIVITAMIN WITH MINERALS) tablet Take 1 tablet by mouth daily. [place]    . omeprazole (PRILOSEC) 40 MG capsule Take 1 capsule (40 mg total) by mouth daily as needed (acid reflux). 90 capsule 3  . polyethylene glycol (MIRALAX / GLYCOLAX) 17 g packet Take 17 g by mouth daily.    Marland Kitchen venlafaxine XR (EFFEXOR-XR) 150 MG 24 hr capsule TAKE 1 CAPSULE BY MOUTH DAILY WITH BREAKFAST. 90 capsule 3  . zolpidem (AMBIEN) 10 MG tablet Take 1 tablet (10 mg total) by mouth at bedtime as needed for sleep. 30 tablet 3   No current facility-administered medications for this visit.   Facility-Administered Medications Ordered in Other Visits  Medication Dose Route Frequency Provider Last Rate Last Admin  . trastuzumab-hyaluronidase-oysk (HERCEPTIN HYLECTA) 600-10000 MG-UNT/5ML chemo SQ injection 600 mg  600 mg Subcutaneous Once Nicholas Lose, MD        PHYSICAL EXAMINATION: ECOG PERFORMANCE STATUS: 1 - Symptomatic but completely ambulatory  There were no vitals filed for  this visit. There were no vitals filed for this visit.  LABORATORY DATA:  I have reviewed the data as listed CMP Latest Ref Rng & Units 09/28/2020 08/17/2020 07/27/2020  Glucose 70 - 99 mg/dL 95 71 76  BUN 6 - 20 mg/dL 6 19 26(H)  Creatinine 0.44 - 1.00 mg/dL 0.76 0.69 0.72  Sodium 135 - 145 mmol/L 141 140 137  Potassium 3.5 - 5.1 mmol/L 4.1 4.4 5.2(H)  Chloride 98 - 111 mmol/L 101 105 102  CO2 22 - 32 mmol/L $RemoveB'28 29 29  'rCebXTGS$ Calcium 8.9 - 10.3 mg/dL 10.2 9.9 9.6  Total Protein 6.5 - 8.1 g/dL 7.7  7.6 7.0  Total Bilirubin 0.3 - 1.2 mg/dL 0.5 0.4 0.5  Alkaline Phos 38 - 126 U/L 102 101 74  AST 15 - 41 U/L 26 35 61(H)  ALT 0 - 44 U/L 32 40 56(H)    Lab Results  Component Value Date   WBC 4.3 09/28/2020   HGB 13.7 09/28/2020   HCT 40.6 09/28/2020   MCV 91.4 09/28/2020   PLT 300 09/28/2020   NEUTROABS 2.8 09/28/2020    ASSESSMENT & PLAN:  Malignant neoplasm of lower-inner quadrant of right breast of female, estrogen receptor positive ([place]) 05/15/2019:Patient palpated a right breast lump for two years. Diagnostic mammogram showed a 4.5cm right breast mass at the 6 o'clock position involving the overlying skin, 2 benign appearing left breast mass at the 10 o'clock position measuring 2.4cm and 2.1cm, and no axillary adenopathy. 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 Maintenance Herceptin subcutaneously every three weeks x 1 year starting 12/09/2019 3.Adjuvant radiation beginning 12/31/2019 4.Follow-up adjuvant antiestrogen therapyBSO scheduled 7/31, currently on Zoladex, to start Anastrozole mid 02/2020 ----------------------------------------------------------------------------------------------------------------------------------------------- 03/08/2020: Bilateral salpingo-oophorectomy  Herceptin:Completed 11/09/2020 Weight loss:Prior gastric bypass surgery, stable Current treatment: Anastrozole and Herceptin (today's last  treatment)  Anastrozole toxicities: 1.Depression: On Effexor and 50 mg daily 2.fatigue:Marked improvement. 3.Elevation of LFTs: Normalized. Acne: We will send a prescription for clindamycin Insomnia issues: Currently on Valium (we discussed about sleep hygiene)  Breast cancer surveillance: Mammograms in 06/2020 normal, breast density D. MRI recommended and orders placed  09/05/20: Bilateral breast augmentation with silicone implants 2. Bilateral breast mastopexy  I discussed with her about neratinib after completion of Herceptin treatment.   Plan: Patient will need a mammogram in December 2022, breast MRI in June 2023 She will discuss with her oncologist in [facility] about starting neratinib Patient will be moving to [facility] for couple of months and then onto [facility]   No orders of the defined types were placed in this encounter.  The patient has a good understanding of the overall plan. she agrees with it. she will call with any problems that may develop before the next visit here.  Total time spent: 30 mins including face to face time and time spent for planning, charting and coordination of care  Rulon Eisenmenger, MD, MPH 11/09/2020  I, Cloyde Reams Dorshimer, am acting as scribe for Dr. Nicholas Lose.  I have reviewed the above documentation for accuracy and completeness, and I agree with the above.

## 2020-11-08 NOTE — Assessment & Plan Note (Signed)
05/15/2019:Patient palpated a right breast lump for two years. Diagnostic mammogram showed a 4.5cm right breast mass at the 6 o'clock position involving the overlying skin, 2 benign appearing left breast mass at the 10 o'clock position measuring 2.4cm and 2.1cm, and no axillary adenopathy. 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 Maintenance Herceptin subcutaneously every three weeks x 1 year starting 12/09/2019 3.Adjuvant radiation beginning 12/31/2019 4.Follow-up adjuvant antiestrogen therapyBSO scheduled 7/31, currently on Zoladex, to start Anastrozole mid 02/2020 ----------------------------------------------------------------------------------------------------------------------------------------------- 03/08/2020: Bilateral salpingo-oophorectomy  Herceptin:Will be completedMay 25th 2022 Echo on 06/22/2020 EF 50-55%--followed by Dr. Haroldine Laws, to be repeated in 6 weeks Weight loss:Prior gastric bypass surgery, stable Current treatment: Anastrozole and Herceptin  Anastrozole toxicities: 1.Hot flashes: On Effexor 2.fatigue:Marked improvement. 3.Elevation of LFTs: Normalized.  Depression: I increase the dosage of Effexor to 150 mg daily. Insomnia issues: Currently on Valium  Breast cancer surveillance: Mammograms in 06/2020 normal, breast density D. MRI recommended and orders placed  09/05/20: Bilateral breast  augmentation with silicone implants 2. Bilateral breast mastopexy  I discussed with her about neratinib after completion of Herceptin treatment. Severe fatigue: We will send for TSH and B12 (B12 level 844).  Return to clinic every 3 weeks for Herceptin every 6 weeks for follow-up with me.

## 2020-11-09 ENCOUNTER — Inpatient Hospital Stay (HOSPITAL_BASED_OUTPATIENT_CLINIC_OR_DEPARTMENT_OTHER): Payer: No Typology Code available for payment source | Admitting: Hematology and Oncology

## 2020-11-09 ENCOUNTER — Other Ambulatory Visit: Payer: Self-pay

## 2020-11-09 ENCOUNTER — Inpatient Hospital Stay: Payer: No Typology Code available for payment source | Attending: Hematology and Oncology

## 2020-11-09 ENCOUNTER — Encounter: Payer: Self-pay | Admitting: *Deleted

## 2020-11-09 VITALS — BP 139/90 | HR 94 | Temp 98.5°F | Resp 16 | Wt 111.2 lb

## 2020-11-09 DIAGNOSIS — L709 Acne, unspecified: Secondary | ICD-10-CM | POA: Insufficient documentation

## 2020-11-09 DIAGNOSIS — C50311 Malignant neoplasm of lower-inner quadrant of right female breast: Secondary | ICD-10-CM | POA: Diagnosis not present

## 2020-11-09 DIAGNOSIS — Z79899 Other long term (current) drug therapy: Secondary | ICD-10-CM | POA: Diagnosis not present

## 2020-11-09 DIAGNOSIS — R5383 Other fatigue: Secondary | ICD-10-CM | POA: Diagnosis not present

## 2020-11-09 DIAGNOSIS — Z170 Estrogen receptor positive status [ER+]: Secondary | ICD-10-CM | POA: Diagnosis not present

## 2020-11-09 DIAGNOSIS — Z5112 Encounter for antineoplastic immunotherapy: Secondary | ICD-10-CM | POA: Diagnosis not present

## 2020-11-09 DIAGNOSIS — R748 Abnormal levels of other serum enzymes: Secondary | ICD-10-CM | POA: Diagnosis not present

## 2020-11-09 DIAGNOSIS — Z79811 Long term (current) use of aromatase inhibitors: Secondary | ICD-10-CM | POA: Diagnosis not present

## 2020-11-09 MED ORDER — TRASTUZUMAB-HYALURONIDASE-OYSK 600-10000 MG-UNT/5ML ~~LOC~~ SOLN
600.0000 mg | Freq: Once | SUBCUTANEOUS | Status: AC
  Administered 2020-11-09: 600 mg via SUBCUTANEOUS
  Filled 2020-11-09: qty 5

## 2020-11-09 MED ORDER — DIPHENHYDRAMINE HCL 25 MG PO CAPS
ORAL_CAPSULE | ORAL | Status: AC
  Filled 2020-11-09: qty 2

## 2020-11-09 MED ORDER — ACETAMINOPHEN 325 MG PO TABS
ORAL_TABLET | ORAL | Status: AC
  Filled 2020-11-09: qty 2

## 2020-11-09 MED ORDER — DIPHENHYDRAMINE HCL 25 MG PO CAPS
50.0000 mg | ORAL_CAPSULE | Freq: Once | ORAL | Status: AC
Start: 2020-11-09 — End: 2020-11-09
  Administered 2020-11-09: 50 mg via ORAL

## 2020-11-09 MED ORDER — CLINDAMYCIN PHOS-BENZOYL PEROX 1-5 % EX GEL: Freq: Two times a day (BID) | CUTANEOUS | 2 refills | Status: AC

## 2020-11-09 MED ORDER — ACETAMINOPHEN 325 MG PO TABS
650.0000 mg | ORAL_TABLET | Freq: Once | ORAL | Status: AC
Start: 2020-11-09 — End: 2020-11-09
  Administered 2020-11-09: 650 mg via ORAL

## 2020-11-09 NOTE — Progress Notes (Signed)
From RN: nurse aware ECHO due as pt missed appmt last week.  Gudena to see pt in infusion.  This is her second to last dose per RN  Henreitta Leber, PharmD

## 2020-11-09 NOTE — Patient Instructions (Signed)
[facility]COL[facility]  Discharge Instructions: Thank you for choosing [facility] to provide your oncology and hematology care.   If you have a lab appointment with the Cancer Center, please go directly to the Cancer Center and check in at the registration area.   Wear comfortable clothing and clothing appropriate for easy access to any Portacath or PICC line.   We strive to give you quality time with your provider. You may need to reschedule your appointment if you arrive late (15 or more minutes).  Arriving late affects you and other patients whose appointments are after yours.  Also, if you miss three or more appointments without notifying the office, you may be dismissed from the clinic at the provider's discretion.      For prescription refill requests, have your pharmacy contact our office and allow 72 hours for refills to be completed.    Today you received the following chemotherapy and/or immunotherapy agents trastuzumab      To help prevent nausea and vomiting after your treatment, we encourage you to take your nausea medication as directed.  BELOW ARE SYMPTOMS THAT SHOULD BE REPORTED IMMEDIATELY: *FEVER GREATER THAN 100.4 F (38 C) OR HIGHER *CHILLS OR SWEATING *NAUSEA AND VOMITING THAT IS NOT CONTROLLED WITH YOUR NAUSEA MEDICATION *UNUSUAL SHORTNESS OF BREATH *UNUSUAL BRUISING OR BLEEDING *URINARY PROBLEMS (pain or burning when urinating, or frequent urination) *BOWEL PROBLEMS (unusual diarrhea, constipation, pain near the anus) TENDERNESS IN MOUTH AND THROAT WITH OR WITHOUT PRESENCE OF ULCERS (sore throat, sores in mouth, or a toothache) UNUSUAL RASH, SWELLING OR PAIN  UNUSUAL VAGINAL DISCHARGE OR ITCHING   Items with * indicate a potential emergency and should be followed up as soon as possible or go to the Emergency Department if any problems should occur.  Please show the CHEMOTHERAPY ALERT CARD or IMMUNOTHERAPY ALERT CARD at check-in to  the Emergency Department and triage nurse.  Should you have questions after your visit or need to cancel or reschedule your appointment, please contact [facility]  Dept: [phone]  and follow the prompts.  Office hours are 8:00 a.m. to 4:30 p.m. Monday - Friday. Please note that voicemails left after 4:00 p.m. may not be returned until the following business day.  We are closed weekends and major holidays. You have access to a nurse at all times for urgent questions. Please call the main number to the clinic Dept: [phone] and follow the prompts.   For any non-urgent questions, you may also contact your provider using MyChart. We now offer e-Visits for anyone 18 and older to request care online for non-urgent symptoms. For details visit [email or web address].   Also download the MyChart app! Go to the app store, search "MyChart", open the app, select [facility], and log in with your MyChart username and password.  Due to Covid, a mask is required upon entering the hospital/clinic. If you do not have a mask, one will be given to you upon arrival. For doctor visits, patients may have 1 support person aged 18 or older with them. For treatment visits, patients cannot have anyone with them due to current Covid guidelines and our immunocompromised population.   

## 2020-11-28 ENCOUNTER — Telehealth: Payer: Self-pay | Admitting: *Deleted

## 2020-11-28 ENCOUNTER — Other Ambulatory Visit: Payer: Self-pay | Admitting: Hematology and Oncology

## 2020-11-28 DIAGNOSIS — C50311 Malignant neoplasm of lower-inner quadrant of right female breast: Secondary | ICD-10-CM

## 2020-11-28 DIAGNOSIS — F5101 Primary insomnia: Secondary | ICD-10-CM

## 2020-11-28 DIAGNOSIS — Z170 Estrogen receptor positive status [ER+]: Secondary | ICD-10-CM

## 2020-11-28 MED ORDER — ANASTROZOLE 1 MG PO TABS: 1.0000 mg | ORAL_TABLET | Freq: Every day | ORAL | 2 refills | Status: AC

## 2020-11-28 MED ORDER — ZOLPIDEM TARTRATE 10 MG PO TABS: 10.0000 mg | ORAL_TABLET | Freq: Every evening | ORAL | 3 refills | Status: DC | PRN

## 2020-11-28 MED ORDER — FLUOXETINE HCL 20 MG PO CAPS: 20.0000 mg | ORAL_CAPSULE | Freq: Every day | ORAL | 0 refills | Status: DC

## 2020-11-28 NOTE — Telephone Encounter (Signed)
Received call from pt with complaint of increase in depression and requesting to switch from Effexor 150 mg to fluoxetine.  Per MD pt needing to wean off Effexor.  Pt needing to take Effexor 75 mg p.o daily x 1 week and then space out the administration to 75 mg p.o every other day x 10 days.  Patient will then start Fluoxetine 20 mg p.o daily x2 weeks and increase to 40 mg p.o daily if symptoms of depression are still present.

## 2020-11-30 ENCOUNTER — Ambulatory Visit: Payer: No Typology Code available for payment source | Admitting: Hematology and Oncology

## 2020-11-30 ENCOUNTER — Ambulatory Visit: Payer: No Typology Code available for payment source

## 2020-12-21 ENCOUNTER — Encounter: Payer: Self-pay | Admitting: Hematology and Oncology

## 2020-12-21 ENCOUNTER — Other Ambulatory Visit: Payer: Self-pay

## 2020-12-21 ENCOUNTER — Other Ambulatory Visit: Payer: Self-pay | Admitting: Hematology and Oncology

## 2020-12-21 MED ORDER — CIPROFLOXACIN HCL 500 MG PO TABS: 500.0000 mg | ORAL_TABLET | Freq: Two times a day (BID) | ORAL | 0 refills | Status: AC

## 2021-01-13 ENCOUNTER — Other Ambulatory Visit: Payer: Self-pay | Admitting: Hematology and Oncology

## 2021-01-14 ENCOUNTER — Encounter (HOSPITAL_COMMUNITY): Payer: Self-pay

## 2021-01-14 ENCOUNTER — Other Ambulatory Visit: Payer: Self-pay | Admitting: Hematology and Oncology

## 2021-01-16 ENCOUNTER — Encounter: Payer: Self-pay | Admitting: Hematology and Oncology

## 2021-01-16 ENCOUNTER — Other Ambulatory Visit: Payer: Self-pay | Admitting: *Deleted

## 2021-01-16 MED ORDER — FLUOXETINE HCL 20 MG PO CAPS: ORAL_CAPSULE | ORAL | 3 refills | Status: DC

## 2021-03-01 ENCOUNTER — Other Ambulatory Visit: Payer: Self-pay | Admitting: Hematology and Oncology

## 2021-04-11 ENCOUNTER — Encounter: Payer: Self-pay | Admitting: Licensed Clinical Social Worker

## 2021-04-11 NOTE — Progress Notes (Signed)
UPDATE: VUS in [place] called [id number] has been reclassified to "Likely Benign." The report date is 04/11/2021.

## 2021-05-09 ENCOUNTER — Other Ambulatory Visit: Payer: Self-pay | Admitting: Hematology and Oncology

## 2021-05-09 DIAGNOSIS — F5101 Primary insomnia: Secondary | ICD-10-CM

## 2021-05-10 ENCOUNTER — Encounter: Payer: Self-pay | Admitting: Hematology and Oncology

## 2021-05-10 ENCOUNTER — Other Ambulatory Visit: Payer: Self-pay | Admitting: Hematology and Oncology

## 2021-05-10 DIAGNOSIS — F5101 Primary insomnia: Secondary | ICD-10-CM

## 2021-05-10 MED ORDER — ZOLPIDEM TARTRATE 10 MG PO TABS: 10.0000 mg | ORAL_TABLET | Freq: Every evening | ORAL | 3 refills | Status: DC | PRN

## 2021-05-10 NOTE — Telephone Encounter (Signed)
Pt is wanting refills sent to [facility] in [place].  She will be establishing care with a PCP towards the end of the month if you could refill until then.  Thanks!

## 2021-05-11 ENCOUNTER — Encounter: Payer: Self-pay | Admitting: Hematology and Oncology

## 2021-05-15 ENCOUNTER — Other Ambulatory Visit: Payer: Self-pay | Admitting: *Deleted

## 2021-05-15 DIAGNOSIS — F5101 Primary insomnia: Secondary | ICD-10-CM

## 2021-05-15 MED ORDER — ZOLPIDEM TARTRATE 10 MG PO TABS: 10.0000 mg | ORAL_TABLET | Freq: Every evening | ORAL | 3 refills | Status: DC | PRN

## 2021-06-29 ENCOUNTER — Other Ambulatory Visit: Payer: Self-pay | Admitting: Hematology and Oncology

## 2021-12-12 ENCOUNTER — Other Ambulatory Visit: Payer: Self-pay | Admitting: Hematology and Oncology

## 2021-12-12 DIAGNOSIS — F5101 Primary insomnia: Secondary | ICD-10-CM

## 2021-12-30 ENCOUNTER — Other Ambulatory Visit: Payer: Self-pay | Admitting: Nurse Practitioner

## 2022-01-03 ENCOUNTER — Telehealth: Payer: Self-pay | Admitting: Hematology and Oncology

## 2022-01-03 NOTE — Telephone Encounter (Signed)
.  Called patient to schedule appointment per 6/8 inbasket, patient is aware of date and time.   

## 2022-02-16 ENCOUNTER — Inpatient Hospital Stay
Payer: No Typology Code available for payment source | Attending: Hematology and Oncology | Admitting: Hematology and Oncology

## 2022-02-16 NOTE — Assessment & Plan Note (Deleted)
05/15/2019:Patient palpated a right breast lump for two years. Diagnostic mammogram showed a 4.5cm right breast mass at the 6 o'clock position involving the overlying skin, 2 benign appearing left breast mass at the 10 o'clock position measuring 2.4cm and 2.1cm, and no axillary adenopathy. 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 Maintenance Herceptin subcutaneously every three weeks x 1 year starting 12/09/2019 completed 11/09/2020 3.Adjuvant radiation beginning 12/31/2019 4.Follow-up adjuvant antiestrogen therapyBSO scheduled 7/31, currently on Zoladex, to start Anastrozole mid 02/2020 ----------------------------------------------------------------------------------------------------------------------------------------------- 03/08/2020: Bilateral salpingo-oophorectomy Weight loss:Prior gastric bypass surgery, stable Current treatment: Anastrozole   Anastrozole toxicities: 1.Depression:On Effexor and 50 mg daily 2.fatigue:Marked improvement. 3.Elevation of LFTs:Normalized. Acne: We will send a prescription for clindamycin Insomnia issues: Currently on Valium (we discussed about sleep hygiene)  Breast cancer surveillance: 06/22/2020: Mammogram: Benign, breast density category D MRI of the breast is recommended  Return to clinic in 1 year for follow-up

## 2023-04-24 NOTE — Telephone Encounter (Signed)
TC
